# Patient Record
Sex: Female | Born: 1975 | Hispanic: Yes | Marital: Single | Smoking: Current some day smoker
Health system: Southern US, Community
[De-identification: ages and names within clinical notes are randomized; demographics above are authoritative.]

## PROBLEM LIST (undated history)

## (undated) ENCOUNTER — Inpatient Hospital Stay (HOSPITAL_COMMUNITY): Payer: Self-pay

## (undated) DIAGNOSIS — G8929 Other chronic pain: Secondary | ICD-10-CM

## (undated) DIAGNOSIS — N200 Calculus of kidney: Secondary | ICD-10-CM

## (undated) DIAGNOSIS — L309 Dermatitis, unspecified: Secondary | ICD-10-CM

## (undated) DIAGNOSIS — Z87442 Personal history of urinary calculi: Secondary | ICD-10-CM

## (undated) DIAGNOSIS — B379 Candidiasis, unspecified: Secondary | ICD-10-CM

## (undated) DIAGNOSIS — M1990 Unspecified osteoarthritis, unspecified site: Secondary | ICD-10-CM

## (undated) DIAGNOSIS — M5126 Other intervertebral disc displacement, lumbar region: Secondary | ICD-10-CM

## (undated) DIAGNOSIS — M778 Other enthesopathies, not elsewhere classified: Secondary | ICD-10-CM

## (undated) DIAGNOSIS — Z9889 Other specified postprocedural states: Secondary | ICD-10-CM

## (undated) HISTORY — DX: Other intervertebral disc displacement, lumbar region: M51.26

## (undated) HISTORY — DX: Unspecified osteoarthritis, unspecified site: M19.90

## (undated) HISTORY — PX: TUBAL LIGATION: SHX77

---

## 2003-01-03 HISTORY — PX: ECTOPIC PREGNANCY SURGERY: SHX613

## 2007-01-29 ENCOUNTER — Emergency Department (HOSPITAL_COMMUNITY): Admission: EM | Admit: 2007-01-29 | Discharge: 2007-01-29 | Payer: Self-pay | Admitting: Emergency Medicine

## 2007-07-06 ENCOUNTER — Emergency Department (HOSPITAL_COMMUNITY): Admission: EM | Admit: 2007-07-06 | Discharge: 2007-07-06 | Payer: Self-pay | Admitting: Emergency Medicine

## 2007-07-08 ENCOUNTER — Emergency Department (HOSPITAL_COMMUNITY): Admission: EM | Admit: 2007-07-08 | Discharge: 2007-07-08 | Payer: Self-pay | Admitting: Emergency Medicine

## 2008-07-25 ENCOUNTER — Emergency Department (HOSPITAL_COMMUNITY): Admission: EM | Admit: 2008-07-25 | Discharge: 2008-07-25 | Payer: Self-pay | Admitting: Emergency Medicine

## 2009-02-02 ENCOUNTER — Emergency Department (HOSPITAL_COMMUNITY): Admission: EM | Admit: 2009-02-02 | Discharge: 2009-02-02 | Payer: Self-pay | Admitting: Emergency Medicine

## 2009-10-30 ENCOUNTER — Emergency Department (HOSPITAL_COMMUNITY): Admission: EM | Admit: 2009-10-30 | Discharge: 2009-10-30 | Payer: Self-pay | Admitting: Family Medicine

## 2010-03-23 LAB — BASIC METABOLIC PANEL
BUN: 11 mg/dL (ref 6–23)
Calcium: 9.1 mg/dL (ref 8.4–10.5)
Creatinine, Ser: 0.71 mg/dL (ref 0.4–1.2)
GFR calc non Af Amer: >60 mL/min (ref >60)
Glucose, Bld: 111 mg/dL — ABNORMAL HIGH (ref 70–99)

## 2010-03-23 LAB — DIFFERENTIAL
Basophils Absolute: 0.1 10*3/uL (ref 0.0–0.1)
Eosinophils Relative: 4 % (ref 0–5)
Lymphocytes Relative: 29 % (ref 12–46)
Neutro Abs: 6.7 10*3/uL (ref 1.7–7.7)

## 2010-03-23 LAB — URINE CULTURE

## 2010-03-23 LAB — CBC
Platelets: 388 10*3/uL (ref 150–400)
RDW: 14 % (ref 11.5–15.5)

## 2010-03-23 LAB — URINALYSIS, ROUTINE W REFLEX MICROSCOPIC
Bilirubin Urine: NEGATIVE
Nitrite: POSITIVE — AB
Specific Gravity, Urine: 1.011 (ref 1.005–1.030)
pH: 7 (ref 5.0–8.0)

## 2010-03-23 LAB — URINE MICROSCOPIC-ADD ON

## 2010-03-23 LAB — WET PREP, GENITAL: Trich, Wet Prep: NONE SEEN

## 2010-03-23 LAB — POCT PREGNANCY, URINE: Preg Test, Ur: NEGATIVE

## 2010-04-18 ENCOUNTER — Inpatient Hospital Stay (HOSPITAL_COMMUNITY)
Admission: AD | Admit: 2010-04-18 | Discharge: 2010-04-18 | Disposition: A | Discharge status: Home / Self Care | Payer: Medicaid Other | Source: Ambulatory Visit | Attending: Obstetrics and Gynecology | Admitting: Obstetrics and Gynecology

## 2010-04-18 DIAGNOSIS — O479 False labor, unspecified: Secondary | ICD-10-CM | POA: Insufficient documentation

## 2010-04-20 ENCOUNTER — Encounter (HOSPITAL_COMMUNITY)
Admission: RE | Admit: 2010-04-20 | Discharge: 2010-04-20 | Disposition: A | Discharge status: Home / Self Care | Payer: Medicaid Other | Source: Ambulatory Visit | Attending: Obstetrics and Gynecology | Admitting: Obstetrics and Gynecology

## 2010-04-20 LAB — CBC
HCT: 32.1 % — ABNORMAL LOW (ref 36.0–46.0)
Hemoglobin: 10.6 g/dL — ABNORMAL LOW (ref 12.0–15.0)
RDW: 14.3 % (ref 11.5–15.5)
WBC: 10.4 10*3/uL (ref 4.0–10.5)

## 2010-04-20 LAB — SURGICAL PCR SCREEN
MRSA, PCR: NEGATIVE
Staphylococcus aureus: NEGATIVE

## 2010-04-20 LAB — RPR: RPR Ser Ql: NONREACTIVE

## 2010-04-25 ENCOUNTER — Inpatient Hospital Stay (HOSPITAL_COMMUNITY)
Admission: RE | Admit: 2010-04-25 | Discharge: 2010-04-28 | DRG: 766 | Disposition: A | Discharge status: Home / Self Care | Payer: Medicaid Other | Source: Ambulatory Visit | Attending: Obstetrics and Gynecology | Admitting: Obstetrics and Gynecology

## 2010-04-25 DIAGNOSIS — IMO0002 Reserved for concepts with insufficient information to code with codable children: Secondary | ICD-10-CM | POA: Diagnosis not present

## 2010-04-25 DIAGNOSIS — O34219 Maternal care for unspecified type scar from previous cesarean delivery: Principal | ICD-10-CM | POA: Diagnosis present

## 2010-04-25 LAB — TYPE AND SCREEN

## 2010-04-25 NOTE — H&P (Signed)
NAMETITYANA, [name] NO.:  [id number]  MEDICAL RECORD NO.:  [id number]           PATIENT TYPE:  LOCATION:                                 FACILITY:  PHYSICIAN:  Huel Cote, M.D. DATE OF BIRTH:  1975-07-27  DATE OF ADMISSION:  04/25/2010 DATE OF DISCHARGE:                             HISTORY & PHYSICAL   Surgery is to take place at the [facility] facility on April 25, 2010, at 7:30 a.m.  The patient is a 35 year old G5, P 2-0-2-2 who is coming in at 15 weeks' gestation for a scheduled repeat cesarean section.  The patient has had two prior cesarean sections and declined a trial of labor.  Her estimated due date is May 02, 2010, by an 11-week ultrasound.  Her prenatal care has been uneventful except for the prior two C-sections as stated.  Prenatal labs are as follows:  B+, antibody negative, RPR nonreactive.  Rubella immune, hepatitis B surface antigen negative, HIV negative.  Baseline platelets were 411.  Hemoglobin, electrophoresis AA, gonorrhea negative, Chlamydia negative, quad screen was negative.  She declined cystic fibrosis screening.  Her 1-hour Glucola was normal at 1:27.  Her group B strep was negative.  She had a normal anatomic ultrasound performed on December 15, 2009.  PAST MEDICAL HISTORY:  Significant for migraines only.  PAST SURGICAL HISTORY:  Significant for two prior cesarean sections in 1999 and 2001.  She also had a history of an ectopic pregnancy in 2005 with a salpingectomy performed through an incision and one elective abortion in 2005 as well.  Her prior two cesarean sections were in [place].  She has an allergy to PENICILLIN.  MEDICATIONS:  Prenatal vitamins only.  She has no history gynecologically of STDs or abnormal Pap smears.  SOCIAL HISTORY:  The patient is single with her boyfriend involved.  She is a smoker but has cut down to almost no cigarettes during the pregnancy.  She denies any current  alcohol or drug use.  PHYSICAL EXAMINATION:  VITAL SIGNS:  The patient's blood pressure is 138/80, weight is 172 pounds. CARDIAC:  Regular rate and rhythm. LUNGS:  Clear. ABDOMEN:  Gravid, nontender.  Cervix is fingertip, 20 and a -3 station. She has an old Pfannenstiel incision with a small area of scar tissue noted under the left aspect which raised and feels as it is in the skin layer.  No discrete hernia is palpable.  The patient has been counseled as to her options but desires to proceed with a repeat cesarean section.  The risks and benefits of surgery were discussed with the patient in detail including bleeding, infection, and possible damage to bowel and bladder.  We also discussed this area of raised tissue under the left aspect of her scar and hopefully, this will be a scar tissue that can be revised.  There is no hernia palpated but it could represent a small hernia as well and we will assess this at the time of surgery.  It really caused her no pain or difficulty and feels immobile as if it is a portion of the skin. Again, all  options were addressed with the patient and she desires to proceed with cesarean section as stated.  She did not wish any permanent sterilization such as a tubal ligation at this point.     Huel Cote, M.D.     KR/MEDQ  D:  04/24/2010  T:  04/24/2010  Job:  [id number]  Electronically Signed by Huel Cote M.D. on 04/25/2010 09:16:09 AM

## 2010-04-26 LAB — CBC
Hemoglobin: 8.8 g/dL — ABNORMAL LOW (ref 12.0–15.0)
MCH: 26.7 pg (ref 26.0–34.0)
MCHC: 33.2 g/dL (ref 30.0–36.0)
MCV: 80.5 fL (ref 78.0–100.0)

## 2010-04-28 LAB — CBC
MCH: 26.6 pg (ref 26.0–34.0)
MCHC: 33 g/dL (ref 30.0–36.0)
MCV: 80.7 fL (ref 78.0–100.0)
Platelets: 218 10*3/uL (ref 150–400)
RDW: 14.4 % (ref 11.5–15.5)
WBC: 10.3 10*3/uL (ref 4.0–10.5)

## 2010-04-28 LAB — COMPREHENSIVE METABOLIC PANEL
Albumin: 2.5 g/dL — ABNORMAL LOW (ref 3.5–5.2)
BUN: 7 mg/dL (ref 6–23)
Calcium: 9.6 mg/dL (ref 8.4–10.5)
Creatinine, Ser: 0.81 mg/dL (ref 0.4–1.2)
Glucose, Bld: 79 mg/dL (ref 70–99)
Potassium: 3.5 mEq/L (ref 3.5–5.1)
Total Protein: 5.5 g/dL — ABNORMAL LOW (ref 6.0–8.3)

## 2010-04-28 LAB — URIC ACID: Uric Acid, Serum: 5.2 mg/dL (ref 2.4–7.0)

## 2010-04-28 LAB — LACTATE DEHYDROGENASE: LDH: 142 U/L (ref 94–250)

## 2010-04-29 ENCOUNTER — Inpatient Hospital Stay (HOSPITAL_COMMUNITY)
Admission: AD | Admit: 2010-04-29 | Discharge: 2010-05-01 | DRG: 776 | Disposition: A | Discharge status: Home / Self Care | Payer: Medicaid Other | Source: Ambulatory Visit | Attending: Obstetrics and Gynecology | Admitting: Obstetrics and Gynecology

## 2010-04-29 DIAGNOSIS — IMO0002 Reserved for concepts with insufficient information to code with codable children: Principal | ICD-10-CM | POA: Diagnosis present

## 2010-04-29 LAB — COMPREHENSIVE METABOLIC PANEL
ALT: 115 U/L — ABNORMAL HIGH (ref 0–35)
AST: 73 U/L — ABNORMAL HIGH (ref 0–37)
Alkaline Phosphatase: 108 U/L (ref 39–117)
CO2: 28 mEq/L (ref 19–32)
Calcium: 9.7 mg/dL (ref 8.4–10.5)
GFR calc Af Amer: >60 mL/min (ref >60)
GFR calc non Af Amer: >60 mL/min (ref >60)
Glucose, Bld: 78 mg/dL (ref 70–99)
Potassium: 3.8 mEq/L (ref 3.5–5.1)
Sodium: 137 mEq/L (ref 135–145)

## 2010-04-29 LAB — CBC
HCT: 28.6 % — ABNORMAL LOW (ref 36.0–46.0)
Hemoglobin: 9.4 g/dL — ABNORMAL LOW (ref 12.0–15.0)
RBC: 3.54 MIL/uL — ABNORMAL LOW (ref 3.87–5.11)
WBC: 10.3 10*3/uL (ref 4.0–10.5)

## 2010-04-29 LAB — URINALYSIS, ROUTINE W REFLEX MICROSCOPIC
Bilirubin Urine: NEGATIVE
Glucose, UA: NEGATIVE mg/dL
Ketones, ur: NEGATIVE mg/dL
Protein, ur: NEGATIVE mg/dL
pH: 6.5 (ref 5.0–8.0)

## 2010-04-29 LAB — URINE MICROSCOPIC-ADD ON

## 2010-04-30 LAB — COMPREHENSIVE METABOLIC PANEL
ALT: 109 U/L — ABNORMAL HIGH (ref 0–35)
AST: 61 U/L — ABNORMAL HIGH (ref 0–37)
Albumin: 2.7 g/dL — ABNORMAL LOW (ref 3.5–5.2)
Calcium: 9.1 mg/dL (ref 8.4–10.5)
Creatinine, Ser: 0.87 mg/dL (ref 0.4–1.2)
GFR calc Af Amer: >60 mL/min (ref >60)
GFR calc non Af Amer: >60 mL/min (ref >60)
Sodium: 138 mEq/L (ref 135–145)
Total Protein: 6 g/dL (ref 6.0–8.3)

## 2010-04-30 LAB — CBC
Hemoglobin: 9.4 g/dL — ABNORMAL LOW (ref 12.0–15.0)
MCH: 26.8 pg (ref 26.0–34.0)
MCHC: 33.5 g/dL (ref 30.0–36.0)
Platelets: 342 10*3/uL (ref 150–400)
RDW: 14.3 % (ref 11.5–15.5)

## 2010-05-01 LAB — COMPREHENSIVE METABOLIC PANEL
AST: 36 U/L (ref 0–37)
CO2: 30 mEq/L (ref 19–32)
Chloride: 98 mEq/L (ref 96–112)
Creatinine, Ser: 0.88 mg/dL (ref 0.4–1.2)
GFR calc Af Amer: >60 mL/min (ref >60)
GFR calc non Af Amer: >60 mL/min (ref >60)
Glucose, Bld: 101 mg/dL — ABNORMAL HIGH (ref 70–99)
Total Bilirubin: 0.5 mg/dL (ref 0.3–1.2)

## 2010-05-01 LAB — CBC
HCT: 30.5 % — ABNORMAL LOW (ref 36.0–46.0)
Hemoglobin: 10.1 g/dL — ABNORMAL LOW (ref 12.0–15.0)
MCH: 26.6 pg (ref 26.0–34.0)
MCHC: 33.1 g/dL (ref 30.0–36.0)
MCV: 80.5 fL (ref 78.0–100.0)
RBC: 3.79 MIL/uL — ABNORMAL LOW (ref 3.87–5.11)

## 2010-05-09 NOTE — Op Note (Addendum)
[name], [name]              ACCOUNT NO.:  [id number]  MEDICAL RECORD NO.:  [id number]           PATIENT TYPE:  O  LOCATION:  [facility]                         [facility]  PHYSICIAN:  Huel Cote, M.D. DATE OF BIRTH:  Aug 02, 1975  DATE OF PROCEDURE:  04/25/2010 DATE OF DISCHARGE:  04/18/2010                              OPERATIVE REPORT   PREOPERATIVE DIAGNOSES: 1. Term pregnancy at 39 weeks. 2. Prior C-section x2, declined vaginal birth after cesarean.  POSTOPERATIVE DIAGNOSES: 1. Term pregnancy at 39 weeks. 2. Prior C-section x2, declined vaginal birth after cesarean.  PROCEDURE:  Repeat low transverse C-section with the uterine incision slightly higher on the fundus and normal due to significant scar tissue with the bladder.  SURGEON:  Huel Cote, MD  ANESTHESIA:  Spinal.  FINDINGS:  There was a viable female infant in the vertex presentation. Apgar's were 7 and 9, weight was pending at the time of dictation. There was normal right ovary and tube noted.  The left ovary was normal with the tube interrupted from her prior ectopic pregnancy.  There was significant scarring of the uterine fundus to the anterior abdominal wall.  This was mostly on the patient's left hand, had to be taken down in order to reach the lower uterine segment.  SPECIMEN:  Placenta was sent to L&D.  ESTIMATED BLOOD LOSS:  700 mL, 250 mL clear urine, 2200 mL of LR.  There were no known complications other than the scarring which made the procedure slightly more difficult.  PROCEDURE NOTE:  The patient was taken to the operating room where spinal anesthesia was obtained without difficulty.  She was then prepped and draped in the normal sterile fashion in the dorsal supine position with a leftward tilt.  A Pfannenstiel's skin incision was then made through preexisting scar and upon encountering the dermal tissue on the patient's left, there was a mucinous cyst which was encountered  and drained and resected.  The cyst wall itself was cauterized.  Once this was accomplished, the fascia was opened in the midline with Bovie cautery and the incision was extended laterally with Mayo scissors bilaterally.  The inferior aspect was grasped with a Kocher clamp, elevated and dissected off the underlying rectus muscles.  The superior aspect likewise was dissected off the rectus muscles.  The rectus muscles were then attempted to be separated in the midline but there was dense scar tissue present.  Therefore, the tissue was elevated and the incision was sharply made along the midline.  The peritoneal cavity was identified superiorly and entered and a sweep of the finger revealed that the fundus was densely adherent to the rectus muscles anteriorly and slightly to the left.  Attention was then turned inferiorly where with careful dissection the peritoneal cavity was identified just above the bladder flap.  Once this was done, the bladder was carefully dissected away and at this point it was possible to isolate the dense band of scar tissue along the uterine fundus and take it down with Bovie cautery, thus releasing the fundus.  Once this was accomplished, the peritoneal cavity incision was large enough to effect  delivery and the Alexis retractor was placed within the incision.  The lower uterine segment was not well developed and the bladder flap did extend somewhat high on the uterus.  It was taken down and pushed away as much as it could be and right above that is where the uterine incision was made transversely.  The tissue here was still quite thick and was consistent with being slightly higher on the fundus than in the lower uterine segment but was as low as could be accomplished due to the bladder scarring.  This was then entered bluntly and clear fluid noted.  The bandage scissors were utilized as well as blunt dissection to extend the uterine incision and once this was  accomplished, the infant's head was elevated and delivered atraumatically.  The remainder of the infant was delivered.  The nose and mouth bulb suctioned and the infant's cord clamped and cut and handed to the awaiting pediatricians.  The cord blood was obtained and the placenta was then delivered manually.  The uterine cavity was cleared of all clots and debris with moist lap sponge.  Due to the tight nature of the incision, it was not easily possible to exteriorize the uterus, however, visualization was adequate and the uterine incision was closed in 2 layers, the first a running locked layer of 1-0 chromic and the second an imbricating layer of the same suture and again the tissue here was quite thick which was consistent with being more in the muscle layer of the lower fundus than the lower uterine segment.  For this reason, after the C-section the patient was counseled that she should not attempt any trial of labor in the future.  The uterine incision appeared hemostatic.  The tubes and ovaries were inspected as stated and found to be normal except for the prior salpingectomy on the patient's left from her ectopic pregnancy. The bladder flap was inspected and appeared hemostatic.  The urine was clear and there was no evidence of any bladder injury.  At this point, all instruments and sponges were removed from the abdomen.  The rectus muscles were reapproximated with several interrupted mattress sutures of 3-0 Vicryl.  The fascia was then closed with 0 Vicryl in a running fashion.  The subcutaneous tissue was reapproximated with 3-0 plain and the skin was closed with staples.  Sponge, lap, and needle counts were correct x2 and the patient was taken to the recovery room in good condition.     Huel Cote, M.D.     KR/MEDQ  D:  04/25/2010  T:  04/25/2010  Job:  [id number]  Electronically Signed by Huel Cote M.D. on 05/25/2010 08:25:00 PM

## 2010-05-09 NOTE — Discharge Summary (Signed)
  [name], [name]              ACCOUNT NO.:  [id number]  MEDICAL RECORD NO.:  [id number]           PATIENT TYPE:  I  LOCATION:  9105                          [facility]  PHYSICIAN:  Huel Cote, M.D. DATE OF BIRTH:  03/02/1975  DATE OF ADMISSION:  04/25/2010 DATE OF DISCHARGE:  04/28/2010                              DISCHARGE SUMMARY   DISCHARGE DIAGNOSES: 1. Term pregnancy at 34 weeks' gestation. 2. Prior cesarean section x2, declined trial of labor. 3. Status post repeat low transverse cesarean section and lysis of     adhesions.  DISCHARGE MEDICATIONS: 1. Motrin 600 mg p.o. every 6 hours. 2. Percocet 1-2 tablets p.o. every 4 hours p.r.n.  DISCHARGE FOLLOWUP:  The patient is to follow up in the office in approximately 3 days for staple removal and a blood pressure check.  HOSPITAL COURSE:  The patient is a 35 year old G5, P 2-0-2-2, who came in at 71 weeks' gestation for a scheduled repeat cesarean section.  For her full history and physical, please see that previously dictated version.  On April 25, 2010, she underwent a repeat low transverse cesarean section and was delivered of a viable female infant, Apgars were 7 and 9, weight was 6 pounds 13 ounces.  She had a normal right ovary and tube.  Her left ovary was normal.  Her left tube had had a prior salpingectomy for an ectopic.  She did have some dense scarring of her anterior fundus to the abdominal wall which was taken down with the cesarean section.  She underwent cesarean section as stated without complication, was admitted for routine postoperative care.  She did well.  Postop hemoglobin on postop day #1 was 8.8.  She continued to feel well.  On the early morning postop day #3, she was noted to have some increased lower extremity edema and a blood pressure of 150/90.  At that time, she had repeat labs done with pre-eclamptic labs performed that were normal.  She had a mild headache that resolved with  Percocet and then her blood pressures actually improved to 140s over 80s, so on postop day #3 she was felt stable for discharge home.  She was discharged to follow up in the office for her staple removal on Monday, May 02, 2010, and given instructions on pelvic rest and will have a blood pressure check in the office that day as well.     Huel Cote, M.D.     KR/MEDQ  D:  04/28/2010  T:  04/28/2010  Job:  [id number]  Electronically Signed by Huel Cote M.D. on 05/09/2010 09:15:09 AM

## 2010-05-10 NOTE — Discharge Summary (Signed)
[name], [name]              ACCOUNT NO.:  [id number]  MEDICAL RECORD NO.:  [id number]           PATIENT TYPE:  I  LOCATION:  9372                          [facility]  PHYSICIAN:  Sherron Monday, MD        DATE OF BIRTH:  1975/05/02  DATE OF ADMISSION:  04/29/2010 DATE OF DISCHARGE:  05/01/2010                              DISCHARGE SUMMARY   ADMITTING DIAGNOSIS:  Postpartum preeclampsia.  DISCHARGE DIAGNOSIS:  Postpartum preeclampsia, status post 24 hours magnesium sulfate as well as hypertensive control.  HISTORY OF PRESENT ILLNESS:  A 35 year old G5, P 3-0-2-3 status post delivery of a viable female infant weighing 6 pounds 13 ounces, delivered by repeat low transverse cesarean section on the 23rd, admitted on postoperative day #4 with increased blood pressures and increased liver enzymes for postpartum magnesium for 24 hours likely help and the plan was to increase her dose of labetalol and monitor blood pressure and liver enzymes.  On admission, her SGOT was 73, her SGPT was 115, and platelets were 321.  Her urinalysis was actually negative for protein.  PAST MEDICAL HISTORY:  Significant for migraines.  SURGICAL HISTORY:  Significant for low transverse cesarean section x3, ex-lap, salpingectomy for an ectopic pregnancy.  PAST OB/GYN HISTORY:  She has had 3 low transverse cesarean sections, therapeutic abortion, and an ectopic.  No history of any abnormal Pap smears or sexually transmitted diseases.  MEDICATIONS:  Labetalol 100 twice daily, Motrin, and Percocet.  ALLERGIES:  PENICILLIN.  SOCIAL HISTORY:  She is single repeat.  She admits to smoking, no alcohol, no drug.  FAMILY HISTORY:  Noncontributory.  PHYSICAL EXAMINATION:  VITAL SIGNS:  On admission, her blood pressure was 153/110 and had been as high as 18 to 190 over 105 on her home monitor.  She was admitted with a benign exam. GENERAL:  No apparent distress. CARDIOVASCULAR:  Regular rate and  rhythm. LUNGS:  Clear to auscultation bilaterally. ABDOMEN:  Soft.  Fundus firm and nontender.  Incision is clean, dry, and intact.  Staples were present. EXTREMITIES:  Symmetric and nontender.  She was admitted for to receive her Percocet and Motrin for pain, increase dose of labetalol to 200, if necessary treat with IV labetalol. She received the magnesium for seizure prophylaxis.  Her blood pressures were 130 to 150s over 70s to 90s with good urine output.  About approximately 12 hours after starting the magnesium, her liver enzymes were falling.  After the magnesium was turned off, her platelets were stable.  SGOT had fallen to 36, SGPT had followed to be 89.  Her Pitocin was continued to be repleted as it had been noted to be as low as 2.6. She was discharged home with routine discharge instructions and was to call with any questions or problems.  Increased dose of labetalol and K- Dur for prescription.  She will follow up in the office at the end of the week.  The staples removed and Steri-Strips were applied.     Sherron Monday, MD  JB/MEDQ  D:  05/01/2010  T:  05/02/2010  Job:  [id number]  Electronically Signed by  Sherron Monday MD on 05/10/2010 08:50:51 AM

## 2010-09-22 LAB — WET PREP, GENITAL
Clue Cells Wet Prep HPF POC: NONE SEEN
WBC, Wet Prep HPF POC: NONE SEEN
Yeast Wet Prep HPF POC: NONE SEEN

## 2010-09-22 LAB — URINALYSIS, ROUTINE W REFLEX MICROSCOPIC
Bilirubin Urine: NEGATIVE
Nitrite: POSITIVE — AB
Protein, ur: 100 — AB
Urobilinogen, UA: 0.2

## 2010-09-22 LAB — URINE MICROSCOPIC-ADD ON

## 2010-09-22 LAB — PREGNANCY, URINE: Preg Test, Ur: NEGATIVE

## 2011-03-10 ENCOUNTER — Encounter (HOSPITAL_COMMUNITY): Payer: Self-pay | Admitting: *Deleted

## 2011-03-10 ENCOUNTER — Emergency Department (HOSPITAL_COMMUNITY)
Admission: EM | Admit: 2011-03-10 | Discharge: 2011-03-10 | Discharge status: Against Medical Advice | Payer: Medicaid Other | Attending: Emergency Medicine | Admitting: Emergency Medicine

## 2011-03-10 DIAGNOSIS — M549 Dorsalgia, unspecified: Secondary | ICD-10-CM | POA: Insufficient documentation

## 2011-03-10 NOTE — ED Notes (Signed)
The pt has had some thoracic spine pain all day.  She has had some back pain in the past

## 2011-04-22 ENCOUNTER — Emergency Department (HOSPITAL_COMMUNITY): Payer: Medicaid Other

## 2011-04-22 ENCOUNTER — Emergency Department (HOSPITAL_COMMUNITY)
Admission: EM | Admit: 2011-04-22 | Discharge: 2011-04-22 | Disposition: A | Discharge status: Home / Self Care | Payer: Medicaid Other | Attending: Emergency Medicine | Admitting: Emergency Medicine

## 2011-04-22 ENCOUNTER — Encounter (HOSPITAL_COMMUNITY): Payer: Self-pay | Admitting: Emergency Medicine

## 2011-04-22 DIAGNOSIS — H9209 Otalgia, unspecified ear: Secondary | ICD-10-CM | POA: Insufficient documentation

## 2011-04-22 DIAGNOSIS — F17200 Nicotine dependence, unspecified, uncomplicated: Secondary | ICD-10-CM | POA: Insufficient documentation

## 2011-04-22 DIAGNOSIS — K0889 Other specified disorders of teeth and supporting structures: Secondary | ICD-10-CM

## 2011-04-22 DIAGNOSIS — I10 Essential (primary) hypertension: Secondary | ICD-10-CM | POA: Insufficient documentation

## 2011-04-22 DIAGNOSIS — K089 Disorder of teeth and supporting structures, unspecified: Secondary | ICD-10-CM | POA: Insufficient documentation

## 2011-04-22 MED ORDER — IBUPROFEN 800 MG PO TABS: 800.0000 mg | ORAL_TABLET | Freq: Three times a day (TID) | ORAL | Status: AC

## 2011-04-22 MED ORDER — CLINDAMYCIN HCL 150 MG PO CAPS: 150.0000 mg | ORAL_CAPSULE | Freq: Four times a day (QID) | ORAL | Status: AC

## 2011-04-22 MED ORDER — TRAMADOL HCL 50 MG PO TABS: 50.0000 mg | ORAL_TABLET | Freq: Four times a day (QID) | ORAL | Status: AC | PRN

## 2011-04-22 MED ORDER — KETOROLAC TROMETHAMINE 60 MG/2ML IM SOLN
60.0000 mg | Freq: Once | INTRAMUSCULAR | Status: AC
  Administered 2011-04-22: 60 mg via INTRAMUSCULAR
  Filled 2011-04-22: qty 2

## 2011-04-22 MED ORDER — HYDROMORPHONE HCL PF 2 MG/ML IJ SOLN
0.5000 mg | Freq: Once | INTRAMUSCULAR | Status: AC
  Administered 2011-04-22: 0.5 mg via INTRAMUSCULAR
  Filled 2011-04-22: qty 1

## 2011-04-22 NOTE — Discharge Instructions (Signed)
You Xray showed multiple cavities. Please take your medication and follow-up with the Dentist in Monday. Take medication as recommended for pain  Dental Pain A tooth ache may be caused by cavities (tooth decay). Cavities expose the nerve of the tooth to air and hot or cold temperatures. It may come from an infection or abscess (also called a boil or furuncle) around your tooth. It is also often caused by dental caries (tooth decay). This causes the pain you are having. DIAGNOSIS  Your caregiver can diagnose this problem by exam. TREATMENT   If caused by an infection, it may be treated with medications which kill germs (antibiotics) and pain medications as prescribed by your caregiver. Take medications as directed.   Only take over-the-counter or prescription medicines for pain, discomfort, or fever as directed by your caregiver.   Whether the tooth ache today is caused by infection or dental disease, you should see your dentist as soon as possible for further care.  SEEK MEDICAL CARE IF: The exam and treatment you received today has been provided on an emergency basis only. This is not a substitute for complete medical or dental care. If your problem worsens or new problems (symptoms) appear, and you are unable to meet with your dentist, call or return to this location. SEEK IMMEDIATE MEDICAL CARE IF:   You have a fever.   You develop redness and swelling of your face, jaw, or neck.   You are unable to open your mouth.   You have severe pain uncontrolled by pain medicine.  MAKE SURE YOU:   Understand these instructions.   Will watch your condition.   Will get help right away if you are not doing well or get worse.  Document Released: 12/19/2004 Document Revised: 12/08/2010 Document Reviewed: 08/07/2007 [facility] Patient Information 2012 [place], [place].

## 2011-04-22 NOTE — ED Provider Notes (Signed)
Medical screening examination/treatment/procedure(s) were performed by non-physician practitioner and as supervising physician I was immediately available for consultation/collaboration.   Forbes Cellar, MD 04/22/11 2107

## 2011-04-22 NOTE — ED Notes (Signed)
Pt c/o left sided toothache x 1 1/2 weeks. Pt reports had appointment with dentist but missed appointment due to school. Pain extends into left ear.

## 2011-04-22 NOTE — ED Provider Notes (Signed)
History     CSN: [id number]  Arrival date & time 04/22/11  7253   First MD Initiated Contact with Patient 04/22/11 1021      10:57 AM HPI Patient reports for 3 days has had pain in her left cheek. States she thinks it is her teeth. Reports gums are extremely sensitive. Denies fever, drainage, swelling. Reports being unable to eat but able to swallow without difficulty. Patient is a 36 y.o. female presenting with tooth pain. The history is provided by the patient.  Dental PainThe primary symptoms include mouth pain. Primary symptoms do not include oral bleeding, oral lesions, headaches, fever, sore throat or cough. The symptoms began 3 to 5 days ago. The symptoms are worsening. The symptoms are new. The symptoms occur constantly.  Additional symptoms include: dental sensitivity to temperature, gum tenderness and ear pain. Additional symptoms do not include: gum swelling, purulent gums, trismus, jaw pain, facial swelling and trouble swallowing.    Past Medical History  Diagnosis Date  . Hypertension   . Ectopic pregnancy     Past Surgical History  Procedure Date  . Cesarean section     History reviewed. No pertinent family history.  History  Substance Use Topics  . Smoking status: Current Everyday Smoker  . Smokeless tobacco: Not on file  . Alcohol Use: Yes     socially    OB History    Grav Para Term Preterm Abortions TAB SAB Ect Mult Living                  Review of Systems  Constitutional: Negative for fever and chills.  HENT: Positive for ear pain. Negative for sore throat, facial swelling, trouble swallowing and neck pain.        Positive for toothache  Respiratory: Negative for cough.   Neurological: Negative for headaches.  All other systems reviewed and are negative.    Allergies  Penicillins  Home Medications   Current Outpatient Rx  Name Route Sig Dispense Refill  . ACETAMINOPHEN 500 MG PO TABS Oral Take 1,000 mg by mouth every 6 (six) hours as  needed. For pain.    Marland Kitchen LABETALOL HCL 200 MG PO TABS Oral Take 200 mg by mouth 2 (two) times daily.      BP 134/81  Pulse 76  Temp(Src) 98.6 F (37 C) (Oral)  Resp 16  SpO2 97%  LMP 04/06/2011  Physical Exam  Vitals reviewed. Constitutional: Vital signs are normal. She appears well-developed and well-nourished. No distress.  HENT:  Head: Normocephalic and atraumatic.  Right Ear: External ear normal.  Left Ear: External ear normal.  Nose: Nose normal.  Mouth/Throat: Uvula is midline, oropharynx is clear and moist and mucous membranes are normal. Normal dentition. No dental caries. Dental abscesses: Left buccal mucusal edema. Does not particularly have a tender took with persuccion.  Eyes: Pupils are equal, round, and reactive to light.  Neck: Neck supple.  Pulmonary/Chest: Effort normal.  Neurological: She is alert.  Skin: Skin is warm and dry. No rash noted. No erythema. No pallor.  Psychiatric: She has a normal mood and affect. Her behavior is normal.    ED Course  Procedures  Dg Orthopantogram  04/22/2011  *RADIOLOGY REPORT*  Clinical Data: 36 year old female with left-sided tooth pain.  ORTHOPANTOGRAM/PANORAMIC  Comparison: None.  Findings: The left maxillary wisdom tooth and posterior molar are surgically absent.  The right mandible anterior molar is surgically absent.  There are dental caries of the left mandibular molar.  No peri apical lucency.  Mandible bone mineralization appears within normal limits.  IMPRESSION: Left mandibular molar dental caries.  Recommend dental follow up.  Original Report Authenticated By: Harley Hallmark, M.D.     MDM  11:50 AM Patient had some relief from IM Toradol and 0.5 of Dilaudid. Advised patient to followup with dentist on Monday. We'll prescribe ibuprofen, Ultram, clindamycin.  Diagnosis dental caries       Thomasene Lot, PA-C 04/22/11 1151

## 2011-04-22 NOTE — ED Notes (Signed)
Patient transported to X-ray 

## 2012-01-23 ENCOUNTER — Encounter (HOSPITAL_COMMUNITY): Payer: Self-pay

## 2012-01-23 ENCOUNTER — Emergency Department (INDEPENDENT_AMBULATORY_CARE_PROVIDER_SITE_OTHER)
Admission: EM | Admit: 2012-01-23 | Discharge: 2012-01-23 | Disposition: A | Discharge status: Home / Self Care | Payer: Medicaid Other | Source: Home / Self Care | Attending: Emergency Medicine | Admitting: Emergency Medicine

## 2012-01-23 ENCOUNTER — Emergency Department (INDEPENDENT_AMBULATORY_CARE_PROVIDER_SITE_OTHER): Payer: Medicaid Other

## 2012-01-23 DIAGNOSIS — S6980XA Other specified injuries of unspecified wrist, hand and finger(s), initial encounter: Secondary | ICD-10-CM

## 2012-01-23 DIAGNOSIS — S6990XA Unspecified injury of unspecified wrist, hand and finger(s), initial encounter: Secondary | ICD-10-CM

## 2012-01-23 MED ORDER — MELOXICAM 7.5 MG PO TABS: 7.5000 mg | ORAL_TABLET | Freq: Every day | ORAL | Status: DC

## 2012-01-23 NOTE — ED Provider Notes (Signed)
History     CSN: [id number]  Arrival date & time 01/23/12  1836   First MD Initiated Contact with Patient 01/23/12 1838      Chief Complaint  Patient presents with  . Hand Pain    (Consider location/radiation/quality/duration/timing/severity/associated sxs/prior treatment) HPI Comments: Patient presents this evening to urgent care describing that about 2 weeks ago while she was trying to discipline her son, " I was tried to slap him", " he has ADHD", he pushed my right hand backwards and hit the wall. Since then has been somewhat sore and hurting when I try to been my right thumb. I have been taking classes and have been unable to hold a pen to write as it has become increasingly worse"..  Patient is a 37 y.o. female presenting with hand pain.  Hand Pain This is a new problem. The current episode started more than 1 week ago. The problem occurs constantly. The problem has been gradually worsening. Pertinent negatives include no abdominal pain and no shortness of breath. Exacerbated by: movement and activity of right thumb. The symptoms are relieved by ice. Treatments tried: ice packs and Tylenol. The treatment provided no relief.    Past Medical History  Diagnosis Date  . Hypertension   . Ectopic pregnancy     Past Surgical History  Procedure Date  . Cesarean section     History reviewed. No pertinent family history.  History  Substance Use Topics  . Smoking status: Current Every Day Smoker  . Smokeless tobacco: Not on file  . Alcohol Use: Yes     Comment: socially    OB History    Grav Para Term Preterm Abortions TAB SAB Ect Mult Living                  Review of Systems  Constitutional: Positive for activity change. Negative for fever, chills, diaphoresis, appetite change, fatigue and unexpected weight change.  Respiratory: Negative for shortness of breath.   Gastrointestinal: Negative for abdominal pain.  Skin: Negative for color change, rash and wound.    Neurological: Negative for weakness and numbness.    Allergies  Penicillins  Home Medications   Current Outpatient Rx  Name  Route  Sig  Dispense  Refill  . LABETALOL HCL 200 MG PO TABS   Oral   Take 200 mg by mouth 2 (two) times daily.         . MELOXICAM 7.5 MG PO TABS   Oral   Take 1 tablet (7.5 mg total) by mouth daily. Take one tablet daily for 2 weeks   14 tablet   0     BP 124/79  Pulse 75  Temp 98.1 F (36.7 C) (Oral)  Resp 16  Ht 5' (1.524 m)  Wt 160 lb (72.576 kg)  BMI 31.25 kg/m2  SpO2 98%  LMP 12/14/2011  Physical Exam  Nursing note and vitals reviewed. Constitutional: She appears well-developed and well-nourished. No distress.  Musculoskeletal: She exhibits tenderness. She exhibits no edema.       Hands: Neurological: She is alert.  Skin: No rash noted. No erythema.    ED Course  Procedures (including critical care time)  Labs Reviewed - No data to display No results found.   1. Thumb injury       MDM  Patient was placed in a thumb spica for comfort to be useful next 5-7 days which she completed an anti-inflammatory course with meloxicam. Have been encouraged that if pain was  to persist or any restriction of range of motion of her right thumb after 10 days follow-up orthopedic Dr.patient agreed with treatment plan and follow-up care as necessary.        Jimmie Molly, MD 01/23/12 1929

## 2012-01-23 NOTE — ED Notes (Addendum)
Reportedly injured her right hand/wrist/thumb 2 weeks ago; no improvement w ice, OTC creams, heat, tylenol; c/o pain "8" on 0-10 scale , uncontrolled w tylenol

## 2012-03-21 LAB — OB RESULTS CONSOLE GC/CHLAMYDIA: Gonorrhea: NEGATIVE

## 2012-03-21 LAB — OB RESULTS CONSOLE RUBELLA ANTIBODY, IGM: Rubella: IMMUNE

## 2012-03-21 LAB — OB RESULTS CONSOLE HIV ANTIBODY (ROUTINE TESTING): HIV: NONREACTIVE

## 2012-04-01 ENCOUNTER — Encounter (HOSPITAL_COMMUNITY): Payer: Self-pay | Admitting: Internal Medicine

## 2012-04-01 ENCOUNTER — Inpatient Hospital Stay (HOSPITAL_COMMUNITY)
Admission: EM | Admit: 2012-04-01 | Discharge: 2012-04-02 | DRG: 781 | Disposition: A | Discharge status: Home / Self Care | Payer: Medicaid Other | Attending: Internal Medicine | Admitting: Internal Medicine

## 2012-04-01 ENCOUNTER — Emergency Department (HOSPITAL_COMMUNITY): Payer: Medicaid Other

## 2012-04-01 DIAGNOSIS — Z331 Pregnant state, incidental: Secondary | ICD-10-CM

## 2012-04-01 DIAGNOSIS — N200 Calculus of kidney: Secondary | ICD-10-CM | POA: Diagnosis present

## 2012-04-01 DIAGNOSIS — F17200 Nicotine dependence, unspecified, uncomplicated: Secondary | ICD-10-CM | POA: Diagnosis present

## 2012-04-01 DIAGNOSIS — N12 Tubulo-interstitial nephritis, not specified as acute or chronic: Secondary | ICD-10-CM | POA: Diagnosis present

## 2012-04-01 DIAGNOSIS — D72829 Elevated white blood cell count, unspecified: Secondary | ICD-10-CM | POA: Diagnosis present

## 2012-04-01 DIAGNOSIS — O2390 Unspecified genitourinary tract infection in pregnancy, unspecified trimester: Principal | ICD-10-CM | POA: Diagnosis present

## 2012-04-01 DIAGNOSIS — N201 Calculus of ureter: Secondary | ICD-10-CM | POA: Diagnosis present

## 2012-04-01 DIAGNOSIS — N390 Urinary tract infection, site not specified: Secondary | ICD-10-CM

## 2012-04-01 DIAGNOSIS — O26839 Pregnancy related renal disease, unspecified trimester: Secondary | ICD-10-CM | POA: Diagnosis present

## 2012-04-01 DIAGNOSIS — Z8249 Family history of ischemic heart disease and other diseases of the circulatory system: Secondary | ICD-10-CM

## 2012-04-01 DIAGNOSIS — O169 Unspecified maternal hypertension, unspecified trimester: Secondary | ICD-10-CM | POA: Diagnosis present

## 2012-04-01 DIAGNOSIS — N1330 Unspecified hydronephrosis: Secondary | ICD-10-CM | POA: Diagnosis present

## 2012-04-01 DIAGNOSIS — Z3490 Encounter for supervision of normal pregnancy, unspecified, unspecified trimester: Secondary | ICD-10-CM

## 2012-04-01 DIAGNOSIS — Z833 Family history of diabetes mellitus: Secondary | ICD-10-CM

## 2012-04-01 LAB — CBC WITH DIFFERENTIAL/PLATELET
Basophils Absolute: 0 10*3/uL (ref 0.0–0.1)
Basophils Relative: 0 % (ref 0–1)
Eosinophils Absolute: 0.3 10*3/uL (ref 0.0–0.7)
MCH: 27.2 pg (ref 26.0–34.0)
MCHC: 35.5 g/dL (ref 30.0–36.0)
Neutrophils Relative %: 70 % (ref 43–77)
Platelets: 393 10*3/uL (ref 150–400)
RDW: 14.5 % (ref 11.5–15.5)

## 2012-04-01 LAB — URINALYSIS, MICROSCOPIC ONLY
Glucose, UA: NEGATIVE mg/dL
Ketones, ur: 15 mg/dL — AB
pH: 7 (ref 5.0–8.0)

## 2012-04-01 LAB — COMPREHENSIVE METABOLIC PANEL
ALT: 14 U/L (ref 0–35)
Albumin: 3.8 g/dL (ref 3.5–5.2)
Alkaline Phosphatase: 58 U/L (ref 39–117)
BUN: 7 mg/dL (ref 6–23)
Potassium: 3.5 mEq/L (ref 3.5–5.1)
Sodium: 138 mEq/L (ref 135–145)
Total Protein: 7.2 g/dL (ref 6.0–8.3)

## 2012-04-01 LAB — PROTIME-INR: Prothrombin Time: 13.9 seconds (ref 11.6–15.2)

## 2012-04-01 LAB — APTT: aPTT: 29 seconds (ref 24–37)

## 2012-04-01 MED ORDER — ONDANSETRON HCL 4 MG/2ML IJ SOLN: 4.0000 mg | Freq: Four times a day (QID) | INTRAMUSCULAR | Status: DC | PRN

## 2012-04-01 MED ORDER — DEXTROSE 5 % IV SOLN
1.0000 g | INTRAVENOUS | Status: DC
  Administered 2012-04-01: 1 g via INTRAVENOUS
  Filled 2012-04-01 (×2): qty 10

## 2012-04-01 MED ORDER — SODIUM CHLORIDE 0.9 % IV SOLN
INTRAVENOUS | Status: DC
  Administered 2012-04-01: 19:00:00 via INTRAVENOUS

## 2012-04-01 MED ORDER — ONDANSETRON HCL 8 MG PO TABS
4.0000 mg | ORAL_TABLET | Freq: Four times a day (QID) | ORAL | Status: DC | PRN
  Filled 2012-04-01: qty 0.5

## 2012-04-01 MED ORDER — DEXTROSE 5 % IV SOLN
1.0000 g | Freq: Once | INTRAVENOUS | Status: AC
  Administered 2012-04-01: 1 g via INTRAVENOUS
  Filled 2012-04-01: qty 10

## 2012-04-01 MED ORDER — ACETAMINOPHEN 650 MG RE SUPP: 650.0000 mg | Freq: Four times a day (QID) | RECTAL | Status: DC | PRN

## 2012-04-01 MED ORDER — SODIUM CHLORIDE 0.9 % IJ SOLN: 3.0000 mL | Freq: Two times a day (BID) | INTRAMUSCULAR | Status: DC

## 2012-04-01 MED ORDER — ACETAMINOPHEN 325 MG PO TABS
650.0000 mg | ORAL_TABLET | Freq: Four times a day (QID) | ORAL | Status: DC | PRN
  Administered 2012-04-01 – 2012-04-02 (×3): 650 mg via ORAL
  Filled 2012-04-01 (×2): qty 2

## 2012-04-01 MED ORDER — HYDROMORPHONE HCL PF 1 MG/ML IJ SOLN
0.5000 mg | Freq: Once | INTRAMUSCULAR | Status: AC
  Administered 2012-04-01: 0.5 mg via INTRAVENOUS
  Filled 2012-04-01: qty 1

## 2012-04-01 MED ORDER — ONDANSETRON 4 MG PO TBDP
2.0000 mg | ORAL_TABLET | Freq: Once | ORAL | Status: AC
  Administered 2012-04-01: 2 mg via ORAL
  Filled 2012-04-01: qty 1

## 2012-04-01 MED ORDER — PRENATAL MULTIVITAMIN CH
1.0000 | ORAL_TABLET | Freq: Every day | ORAL | Status: DC
  Administered 2012-04-02: 1 via ORAL
  Filled 2012-04-01: qty 1

## 2012-04-01 MED ORDER — SODIUM CHLORIDE 0.9 % IV SOLN
INTRAVENOUS | Status: DC
  Administered 2012-04-01 – 2012-04-02 (×2): via INTRAVENOUS

## 2012-04-01 NOTE — ED Notes (Signed)
Dr Clydene Pugh in at bedside.

## 2012-04-01 NOTE — Consult Note (Signed)
Urology Consult  Requesting provider:  Dr. Hyacinth Meeker  CC: Right hydronephrosis. UTI. Possible right nephrolithiasis.  HPI: 37 year old female who is [redacted] weeks pregnant presents with 1 week history of right flank pain. This started as dull and has progressed to become sharp. No associated nausea, emesis, or gross hematuria. Better with narcotics. No personal history of stones. Her UA tonight is concerning for infection. She has a mild leukocytosis at 12.5. Her vital signs are stable and she is not labile. She had a renal US which is concerning for right hydronephrosis and possible large renal stone obstructing her renal pelvis.   We discussed the limitations of [place] in detecting stones and the possibility that this may not be a stag-horn stone. We reviewed options including observation with IV antibiotics, retrograde ureter stent placement, or percutaneous nephrostomy tube placement on the right. We discussed the risks, benefits, and side effects of each option including the risk to the fetus. I explained that if a stone were present, it would not be able to be treated until after her pregnancy was complete. I explained that foreign bodies in the urinary tract in pregnancy tend to become encrusted and obstructed. This results in the need for exchange on a regular basis. The frequency of exchange is difficult to determine, but could be as frequent as every 3-4 weeks.  PMH: Past Medical History  Diagnosis Date  . Hypertension   . Ectopic pregnancy     PSH: Past Surgical History  Procedure Laterality Date  . Cesarean section      Allergies: Allergies  Allergen Reactions  . Penicillins Other (See Comments)    unknown    Medications:  (Not in a hospital admission)   Social History: History   Social History  . Marital Status: Single    Spouse Name: N/A    Number of Children: N/A  . Years of Education: N/A   Occupational History  . Not on file.   Social History Main Topics  .  Smoking status: Current Every Day Smoker  . Smokeless tobacco: Not on file  . Alcohol Use: Yes     Comment: socially  . Drug Use: No  . Sexually Active:    Other Topics Concern  . Not on file   Social History Narrative  . No narrative on file    Family History: No family history on file.  Review of Systems: Positive: Right flank pain. Negative: Fever, chest pain, or SOB.  A further 10 point review of systems was negative except what is listed in the HPI.  Physical Exam: Filed Vitals:   04/01/12 2130  BP: 153/77  Pulse: 74  Temp:   Resp:     General: No acute distress.  Awake. Head:  Normocephalic.  Atraumatic. ENT:  EOMI.  Mucous membranes moist Neck:  Supple.  No lymphadenopathy. CV:  S1 present. S2 present. Regular rate. Pulmonary: Equal effort bilaterally.  Clear to auscultation bilaterally. Abdomen: Soft.  Non- tender to palpation. Skin:  Normal turgor.  No visible rash. Extremity: No gross deformity of bilateral upper extremities.  No gross deformity of    bilateral lower extremities. Neurologic: Alert. Appropriate mood.   Studies:  Recent Labs     04/01/12  1523  HGB  12.2  WBC  12.5*  PLT  393    Recent Labs     04/01/12  1523  NA  138  K  3.5  CL  104  CO2  23  BUN  7  CREATININE  0.57  CALCIUM  9.5  GFRNONAA  >90  GFRAA  >90     No results found for this basename: PT, INR, APTT,  in the last 72 hours   No components found with this basename: ABG,     Assessment:  UTI. Right hydronephrosis. Possible right stag-horn calculus.  Plan: -Obtain blood and urine cultures. -Make NPO. -Obtain PT/INR & aPTT studies. -Recommend admission to OB. -Patient would like to have a right percutaneous nephrostomy tube placed. I do not think this is urgent tonight. I have spoken with Dr. Grace Isaac who will pass this along to Interventional Radiology tomorrow to put her on the schedule. -Will follow along.    Pager: [phone]    CC: Dr. Hyacinth Meeker

## 2012-04-01 NOTE — ED Notes (Signed)
C/o intermittent right flank pain radiates to RUQ since Tues. Reports pain progressively worsening & becoming constant. Denies n/v/d, fever, chills, dysuria, urinary frequency, vaginal discharge or bleeding. States is [redacted] weeks pregnant, EDD 10-24-12.  Pain worsens with mvmt & laying on that side.

## 2012-04-01 NOTE — ED Notes (Signed)
Patient to ambulate to restroom to give urine sample.

## 2012-04-01 NOTE — ED Notes (Signed)
Patient report obtained from Monica, care taken over at this time.  

## 2012-04-01 NOTE — ED Provider Notes (Signed)
37 year old female G6 P3 at 10 weeks per the patient's history and received an ultrasound at [facility] orthopedics delineating the presence of an intrauterine pregnancy. She does have a history of ectopic pregnancy. She presents with intermittent right flank pain since 6 days ago. This has been fairly constant since Friday and the patient has been in significant discomfort with the inability to eat or drink secondary to pain and nausea. She is made less and less urine over the last several days. She really has no lower abdominal tenderness. On exam she has a soft nontender abdomen, mild tenderness over the right flank, clear heart and lung sounds with palpable pulses and normal capillary refill.  On my bedside ultrasound she appears to have hydronephrosis of the right kidney as compared to a normal-appearing left kidney and her intrauterine pregnancy appears consistent with data hCG as well as the presence of a fetal heartbeat which is barely visible but appears to be normal given gestational age.  Formal ultrasound has been ordered, urinalysis ordered, pain medications ordered.  Ultrasound shows unilateral hydronephrosis with a likely staghorn calculus. Her urinalysis confirms that she likely has an infection. I have discussed her care with the urologist Dr. Margarita Grizzle as well as with interventional radiologist Dr. Grace Isaac and OB/GYN doctor Meisinger, admission deferred to the internist, paged at this time.  Patient appears hemodynamically stable and does not appear septic from the infection  Blood cultures are requested by consultant  Medical screening examination/treatment/procedure(s) were conducted as a shared visit with non-physician practitioner(s) and myself.  I personally evaluated the patient during the encounter    Vida Roller, MD 04/01/12 2140

## 2012-04-01 NOTE — ED Notes (Addendum)
Pt sts right flank pain becoming more severe x 3 days; pt sts [redacted] weeks pregnant with ultrasound to confirm pregnancy; pt sts hx of ectopic in past; pt denies bleeding or discharge or pain with urination; pt G6 P3 M1 A1

## 2012-04-01 NOTE — H&P (Signed)
[facility] History and Physical  [name] ZOX:[id number] DOB: May 18, 1975 DOA: 04/01/2012  Referring physician: Dr.Miller. PCP: No PCP Per Patient  Specialists: Dr.[facility].  Chief Complaint: Right flank pain.  HPI: [name] is a 37 y.o. female with no significant past medical history presented the ER because of worsening right flank pain. Patient has been having flank pain since a week now which has been gradually worsening. Denies any associated nausea vomiting fever chills dysuria or diarrhea. In the ER renal sonogram revealed a right-sided renal stone with hydronephrosis. Patient's UA is compatible UTI. Patient is [redacted] weeks pregnant. On-call urologist Dr. Margarita Grizzle has been consulted. Patient's gynecologist on call was also consulted by the ED physician Dr. Hyacinth Meeker. Presently patient has been admitted for further workup given her complicated UTI with renal stone or hydronephrosis. Patient recently does not look septic. Patient otherwise denies any chest pain shortness of breath.  Review of Systems: As presented in the history of presenting illness, rest negative.  Past Medical History  Diagnosis Date  . Hypertension   . Ectopic pregnancy    Past Surgical History  Procedure Laterality Date  . Cesarean section     Social History:  reports that she has been smoking.  She does not have any smokeless tobacco history on file. She reports that  drinks alcohol. She reports that she does not use illicit drugs. Lives at home. where does patient live-- Can do ADLs. Can patient participate in ADLs?  Allergies  Allergen Reactions  . Penicillins Other (See Comments)    unknown    Family History  Problem Relation Age of Onset  . Diabetes type II Mother   . Hypertension Mother   . Diabetes type II Sister       Prior to Admission medications   Medication Sig Start Date End Date Taking? Authorizing Provider  acetaminophen (TYLENOL) 500 MG tablet Take 500 mg by  mouth every 6 (six) hours as needed for pain.   Yes Historical Provider, MD  diphenhydramine-acetaminophen (TYLENOL PM) 25-500 MG TABS Take 1 tablet by mouth at bedtime as needed (for pain).   Yes Historical Provider, MD  Prenatal Vit-Fe Fumarate-FA (PRENATAL MULTIVITAMIN) TABS Take 1 tablet by mouth daily at 12 noon.   Yes Historical Provider, MD   Physical Exam: Filed Vitals:   04/01/12 1933 04/01/12 2030 04/01/12 2100 04/01/12 2130  BP: 107/82 122/73 135/69 153/77  Pulse: 75 79 78 74  Temp: 98.6 F (37 C)     TempSrc: Oral     Resp: 18     SpO2: 99% 100% 100% 100%     General:  Well-developed well-nourished.  Eyes: Anicteric no pallor.  ENT: No discharge from ears eyes nose or mouth.  Neck: No JVD appreciated.  Cardiovascular: S1-S2 heard.  Respiratory: No rhonchi no crepitations.  Abdomen: Soft nontender bowel sounds present.  Skin: No rash appreciated.  Musculoskeletal: No edema.  Psychiatric: Appears normal.  Neurologic: Alert awake oriented to time place and person. Moves all extremities.  Labs on Admission:  Basic Metabolic Panel:  Recent Labs Lab 04/01/12 1523  NA 138  K 3.5  CL 104  CO2 23  GLUCOSE 96  BUN 7  CREATININE 0.57  CALCIUM 9.5   Liver Function Tests:  Recent Labs Lab 04/01/12 1523  AST 16  ALT 14  ALKPHOS 58  BILITOT 0.4  PROT 7.2  ALBUMIN 3.8   No results found for this basename: LIPASE, AMYLASE,  in the last 168 hours No  results found for this basename: AMMONIA,  in the last 168 hours CBC:  Recent Labs Lab 04/01/12 1523  WBC 12.5*  NEUTROABS 8.8*  HGB 12.2  HCT 34.4*  MCV 76.6*  PLT 393   Cardiac Enzymes: No results found for this basename: CKTOTAL, CKMB, CKMBINDEX, TROPONINI,  in the last 168 hours  BNP (last 3 results) No results found for this basename: PROBNP,  in the last 8760 hours CBG: No results found for this basename: GLUCAP,  in the last 168 hours  Radiological Exams on Admission: US  Renal  04/01/2012  *RADIOLOGY REPORT*  Clinical Data: Flank pain  RENAL / URINARY TRACT ULTRASOUND  Technique:  Complete ultrasound exam of the kidneys and urinary bladder was performed.  Comparison: No comparison studies available.  Findings:  The right kidney measures 10.9 cm in long axis.  The left kidney measures 11.4 cm.  There is fullness of the right intrarenal collecting system.  Hyperechoic shadowing material is identified in the collecting system of the right kidney. This is most likely related to a large staghorn calculus.  As there appears to wrapped around the structure on some of the images, I do not think it represents gas within the collecting system.  Left kidney is sonographically normal in appearance.  Bladder is decompressed.  Intrauterine gestational sac noted incidentally in the uterus.  Impression:  Echogenic material in the right intrarenal collecting system and appears to fill central calyces of the kidney.  This appearance is most likely related to a large staghorn calculus as urine can be seen encasing the echogenic material on some images. There is associated mild to moderate hydronephrosis on the right.  I discussed these findings by telephone with Raymon Mutton at 1930 hours on 04/01/2012.   Original Report Authenticated By: Kennith Center, M.D.      Assessment/Plan Principal Problem:   UTI (lower urinary tract infection) Active Problems:   Renal stone   Hydronephrosis   Pregnancy   1. Complicated UTI with right-sided hydronephrosis and renal stone - patient will be continued with IV ceftriaxone. Follow urine culture. Further recommendations per urologist. In anticipation of possible procedure patient will be kept n.p.o. past midnight. 2. Pregnancy, 10 weeks  3. Recently quit smoking.    Code Status:  Full code.  Family Communication: None.  Disposition Plan: Admit to inpatient.    Jacalynn Buzzell N. [facility] Pager [phone].  If 7PM-7AM, please  contact night-coverage [email or web address] Password [facility] 04/01/2012, 10:21 PM

## 2012-04-01 NOTE — ED Notes (Signed)
Patient report called for admission to [facility] for admission. Nurse voiced understanding of report and had no further questions. Patient is in stable condition at this time for transport. Patient will be going via stretcher to room 5509.

## 2012-04-01 NOTE — ED Notes (Signed)
Patient transported to Ultrasound 

## 2012-04-01 NOTE — ED Provider Notes (Signed)
History     CSN: [id number]  Arrival date & time 04/01/12  1500   First MD Initiated Contact with Patient 04/01/12 1658      Chief Complaint  Patient presents with  . Flank Pain    (Consider location/radiation/quality/duration/timing/severity/associated sxs/prior treatment) HPI Comments: [name] is a 37 y/o F with PMHx of HTN and ectopic pregnancy, who is [redacted] weeks pregnant,  reporting to the ED with right flank pain that has been going since last Tuesday. Patient reported that in the beginning the pain was localized to the right flank that was an intermittent dull, achy pain that has gotten progressively worse since last Friday - described now as a constant, sharp, shooting/stabbing pain that radiates to the abdominal region, rated pain as 10/10. Patient reported using tylenol, ice, and heat with minimal relief - patient stated that sitting upright and laying on left side is the only way she finds relief of pain. Associated symptoms are decreased appetite and fatigue. Patient denied, vaginal discharge, vaginal bleeding, hematuria, dysuria, fever, chills, nausea, vomiting, diarrhea, constipation, chest pain, shortness of breathe, difficulty breathing, trauma, injury, travel, visual distortions, headaches, weakness to extremities, numbness, paresthesias.   Patient reported being [redacted] weeks pregnant, was confirmed at 7 weeks at [facility] via ultrasound - patient reported that pregnancy is going well and that fetus is present in the uterus. Patient reported LMP to be in late December of 2013 - due date is October 24, 2012. [id number] - patient reported one abortion in 2005, accomplished with vacuum technique, patient reported ectopic pregnancy in 2005. Patient reported right fallopian tube removed in 2012. Patient reported Cesarean sections with all full term pregnancies, without complications. Patient reported pre-eclampsia in 2012 shortly after delivery of her child. Patient denied  complications during pregnancies - denied pre-eclampsia and gestational diabetes during pregnancies.   Patient is a 37 y.o. female presenting with flank pain. The history is provided by the patient. No language interpreter was used.  Flank Pain This is a new problem. The current episode started in the past 7 days. The problem occurs constantly. The problem has been gradually worsening. Associated symptoms include abdominal pain and fatigue. Pertinent negatives include no change in bowel habit, chest pain, chills, coughing, fever, headaches, nausea, neck pain, numbness, sore throat, urinary symptoms, visual change, vomiting or weakness. Exacerbated by: Laying on right side. She has tried acetaminophen, heat and ice for the symptoms. The treatment provided no relief.    Past Medical History  Diagnosis Date  . Hypertension   . Ectopic pregnancy     Past Surgical History  Procedure Laterality Date  . Cesarean section      Family History  Problem Relation Age of Onset  . Diabetes type II Mother   . Hypertension Mother   . Diabetes type II Sister     History  Substance Use Topics  . Smoking status: Current Every Day Smoker -- 10 years  . Smokeless tobacco: Not on file     Comment: patient states she only smokes 1 cigarette a day  . Alcohol Use: No    OB History   Grav Para Term Preterm Abortions TAB SAB Ect Mult Living                  Review of Systems  Constitutional: Positive for fatigue. Negative for fever and chills.  HENT: Negative for ear pain, sore throat, neck pain and tinnitus.   Eyes: Negative for photophobia, pain and visual disturbance.  Respiratory:  Negative for cough and chest tightness.   Cardiovascular: Negative for chest pain.  Gastrointestinal: Positive for abdominal pain. Negative for nausea, vomiting and change in bowel habit.  Genitourinary: Positive for flank pain. Negative for dysuria, frequency, hematuria, vaginal bleeding, vaginal discharge,  difficulty urinating and vaginal pain.  Musculoskeletal: Positive for back pain.       Right flank pain with radiation to the abdomen.   Neurological: Negative for weakness, numbness and headaches.  All other systems reviewed and are negative.    Allergies  Review of patient's allergies indicates no active allergies.  Home Medications   No current outpatient prescriptions on file.  BP 153/77  Pulse 71  Temp(Src) 98.6 F (37 C) (Oral)  Resp 18  SpO2 100%  LMP 12/20/2011  Physical Exam  Nursing note and vitals reviewed. Constitutional: She is oriented to person, place, and time. She appears well-developed and well-nourished. No distress.  HENT:  Head: Normocephalic and atraumatic.  Mouth/Throat: Oropharynx is clear and moist. No oropharyngeal exudate.  Eyes: Conjunctivae and EOM are normal. Pupils are equal, round, and reactive to light. Right eye exhibits no discharge. Left eye exhibits no discharge.  Neck: Normal range of motion. Neck supple. No tracheal deviation present.  Cardiovascular: Normal rate, regular rhythm, normal heart sounds and intact distal pulses.  Exam reveals no friction rub.   No murmur heard. Pulmonary/Chest: Effort normal and breath sounds normal. No respiratory distress. She has no wheezes. She has no rales.  Abdominal: Soft. Bowel sounds are normal. She exhibits no distension. There is no tenderness. There is no rebound and no guarding.  No pain upon palpation to all 4 quadrants Negative McBurney's point Negative Murphy's sign Negative Psoas sign Negative Obtruator sign  Musculoskeletal: She exhibits no edema and no tenderness.  Pain upon palpation to the right flank  No CVA tenderness.  Lymphadenopathy:    She has no cervical adenopathy.  Neurological: She is alert and oriented to person, place, and time. No cranial nerve deficit. She exhibits normal muscle tone. Coordination normal.  Skin: Skin is warm and dry. No rash noted. She is not  diaphoretic. No erythema.  Psychiatric: She has a normal mood and affect. Her behavior is normal. Thought content normal.    ED Course  Procedures (including critical care time)  Labs Reviewed  CBC WITH DIFFERENTIAL - Abnormal; Notable for the following:    WBC 12.5 (*)    HCT 34.4 (*)    MCV 76.6 (*)    Neutro Abs 8.8 (*)    All other components within normal limits  URINALYSIS, MICROSCOPIC ONLY - Abnormal; Notable for the following:    APPearance CLOUDY (*)    Hgb urine dipstick TRACE (*)    Ketones, ur 15 (*)    Nitrite POSITIVE (*)    Leukocytes, UA LARGE (*)    Bacteria, UA MANY (*)    Squamous Epithelial / LPF FEW (*)    All other components within normal limits  HCG, QUANTITATIVE, PREGNANCY - Abnormal; Notable for the following:    hCG, Beta Chain, Quant, [place] [address] (*)    All other components within normal limits  URINE CULTURE  CULTURE, BLOOD (ROUTINE X 2)  CULTURE, BLOOD (ROUTINE X 2)  COMPREHENSIVE METABOLIC PANEL  APTT  PROTIME-INR  BASIC METABOLIC PANEL  CBC   US Renal  04/01/2012  *RADIOLOGY REPORT*  Clinical Data: Flank pain  RENAL / URINARY TRACT ULTRASOUND  Technique:  Complete ultrasound exam of the kidneys and urinary bladder  was performed.  Comparison: No comparison studies available.  Findings:  The right kidney measures 10.9 cm in long axis.  The left kidney measures 11.4 cm.  There is fullness of the right intrarenal collecting system.  Hyperechoic shadowing material is identified in the collecting system of the right kidney. This is most likely related to a large staghorn calculus.  As there appears to wrapped around the structure on some of the images, I do not think it represents gas within the collecting system.  Left kidney is sonographically normal in appearance.  Bladder is decompressed.  Intrauterine gestational sac noted incidentally in the uterus.  Impression:  Echogenic material in the right intrarenal collecting system and appears to fill central  calyces of the kidney.  This appearance is most likely related to a large staghorn calculus as urine can be seen encasing the echogenic material on some images. There is associated mild to moderate hydronephrosis on the right.  I discussed these findings by telephone with Raymon Mutton at 1930 hours on 04/01/2012.   Original Report Authenticated By: Kennith Center, M.D.    Results for orders placed during the hospital encounter of 04/01/12  CBC WITH DIFFERENTIAL      Result Value Range   WBC 12.5 (*) 4.0 - 10.5 K/uL   RBC 4.49  3.87 - 5.11 MIL/uL   Hemoglobin 12.2  12.0 - 15.0 g/dL   HCT 09.8 (*) 11.9 - 14.7 %   MCV 76.6 (*) 78.0 - 100.0 fL   MCH 27.2  26.0 - 34.0 pg   MCHC 35.5  30.0 - 36.0 g/dL   RDW 82.9  56.2 - 13.0 %   Platelets 393  150 - 400 K/uL   Neutrophils Relative 70  43 - 77 %   Neutro Abs 8.8 (*) 1.7 - 7.7 K/uL   Lymphocytes Relative 22  12 - 46 %   Lymphs Abs 2.7  0.7 - 4.0 K/uL   Monocytes Relative 5  3 - 12 %   Monocytes Absolute 0.7  0.1 - 1.0 K/uL   Eosinophils Relative 3  0 - 5 %   Eosinophils Absolute 0.3  0.0 - 0.7 K/uL   Basophils Relative 0  0 - 1 %   Basophils Absolute 0.0  0.0 - 0.1 K/uL  COMPREHENSIVE METABOLIC PANEL      Result Value Range   Sodium 138  135 - 145 mEq/L   Potassium 3.5  3.5 - 5.1 mEq/L   Chloride 104  96 - 112 mEq/L   CO2 23  19 - 32 mEq/L   Glucose, Bld 96  70 - 99 mg/dL   BUN 7  6 - 23 mg/dL   Creatinine, Ser 8.65  0.50 - 1.10 mg/dL   Calcium 9.5  8.4 - 78.4 mg/dL   Total Protein 7.2  6.0 - 8.3 g/dL   Albumin 3.8  3.5 - 5.2 g/dL   AST 16  0 - 37 U/L   ALT 14  0 - 35 U/L   Alkaline Phosphatase 58  39 - 117 U/L   Total Bilirubin 0.4  0.3 - 1.2 mg/dL   GFR calc non Af Amer >90  >90 mL/min   GFR calc Af Amer >90  >90 mL/min  URINALYSIS, MICROSCOPIC ONLY      Result Value Range   Color, Urine YELLOW  YELLOW   APPearance CLOUDY (*) CLEAR   Specific Gravity, Urine 1.017  1.005 - 1.030   pH 7.0  5.0 - 8.0  Glucose, UA NEGATIVE   NEGATIVE mg/dL   Hgb urine dipstick TRACE (*) NEGATIVE   Bilirubin Urine NEGATIVE  NEGATIVE   Ketones, ur 15 (*) NEGATIVE mg/dL   Protein, ur NEGATIVE  NEGATIVE mg/dL   Urobilinogen, UA 0.2  0.0 - 1.0 mg/dL   Nitrite POSITIVE (*) NEGATIVE   Leukocytes, UA LARGE (*) NEGATIVE   WBC, UA 11-20  <3 WBC/hpf   RBC / HPF 0-2  <3 RBC/hpf   Bacteria, UA MANY (*) RARE   Squamous Epithelial / LPF FEW (*) RARE   Urine-Other AMORPHOUS URATES/PHOSPHATES    HCG, QUANTITATIVE, PREGNANCY      Result Value Range   hCG, Beta Chain, Quant, [place] [address] (*) <5 mIU/mL  APTT      Result Value Range   aPTT 29  24 - 37 seconds  PROTIME-INR      Result Value Range   Prothrombin Time 13.9  11.6 - 15.2 seconds   INR 1.08  0.00 - 1.49   US Renal  04/01/2012  *RADIOLOGY REPORT*  Clinical Data: Flank pain  RENAL / URINARY TRACT ULTRASOUND  Technique:  Complete ultrasound exam of the kidneys and urinary bladder was performed.  Comparison: No comparison studies available.  Findings:  The right kidney measures 10.9 cm in long axis.  The left kidney measures 11.4 cm.  There is fullness of the right intrarenal collecting system.  Hyperechoic shadowing material is identified in the collecting system of the right kidney. This is most likely related to a large staghorn calculus.  As there appears to wrapped around the structure on some of the images, I do not think it represents gas within the collecting system.  Left kidney is sonographically normal in appearance.  Bladder is decompressed.  Intrauterine gestational sac noted incidentally in the uterus.  Impression:  Echogenic material in the right intrarenal collecting system and appears to fill central calyces of the kidney.  This appearance is most likely related to a large staghorn calculus as urine can be seen encasing the echogenic material on some images. There is associated mild to moderate hydronephrosis on the right.  I discussed these findings by telephone with Raymon Mutton at 1930 hours on 04/01/2012.   Original Report Authenticated By: Kennith Center, M.D.      1. UTI (lower urinary tract infection)   2. Hydronephrosis   3. Pregnancy   4. Renal stone       MDM  DDx: Nephrolithiasis Ectopic pregnancy UTI Musculoskeletal cause   I personally evaluated and examined the patient. Patient appeared to be alert, oriented, and calm. No CVA tenderness noted. Pain upon palpation to the right flank with radiation to the abdomen. Negative McBurney's point, Murphy's sign, Psoas sign, Obtruator sign. No sign of peritonitis.  IV saline lock given - NS, dilaudid, zofran given for pain/nausea of patient.  CMP negative findings.  CBC: Hgb and Hct low - trend seen when labs were reviewed; mildly elevated WBC (12.5). Beta-HCG levels elevated due to pregnancy (60454). UA showed trace of Hgb, positive ketones, positive nitrites, large number of leukocytes, many bacteria present.  Urine sent for culture.  Discussed findings of ultrasound with Dr. Molli Posey at 7:26pm regarding findings to renal US - Staghorn calculus, approximately 4 cm, found in right kidney with mild to moderate hydronephrosis.    With increased WBC, urine positive for nitrites/bacteria/leukocytes/ketones/trace of Hgb with presence of staghorn calculus to right kidney - patient suspected to have pyelonephrosis. Dicussed case and findings with Dr. Hyacinth Meeker.  Dr. Hyacinth Meeker spoke with urology - Dr. Margarita Grizzle, interventional radiology - Dr. Grace Isaac, and obstetrics/gynecology Dr. Jackelyn Knife about patient's care.  Patient is to be admitted to Internal Medicine, attending Dr. Toniann Fail.         [employer], PA-C 04/02/12 0013

## 2012-04-01 NOTE — ED Notes (Signed)
Patient resting on stretcher at this time. Patient denies any pain at this time, states the pain was in her right flank area. Patient is tearful at this time, states she is emotional and pregnant. Call light at bedside with patient. Will continue to monitor.

## 2012-04-02 ENCOUNTER — Encounter (HOSPITAL_COMMUNITY): Payer: Self-pay | Admitting: Radiology

## 2012-04-02 LAB — BASIC METABOLIC PANEL
Chloride: 103 mEq/L (ref 96–112)
GFR calc Af Amer: >90 mL/min (ref >90)
Potassium: 3 mEq/L — ABNORMAL LOW (ref 3.5–5.1)
Sodium: 137 mEq/L (ref 135–145)

## 2012-04-02 LAB — CBC
HCT: 31.9 % — ABNORMAL LOW (ref 36.0–46.0)
Hemoglobin: 11.2 g/dL — ABNORMAL LOW (ref 12.0–15.0)
RDW: 14.7 % (ref 11.5–15.5)
WBC: 11.1 10*3/uL — ABNORMAL HIGH (ref 4.0–10.5)

## 2012-04-02 LAB — GLUCOSE, CAPILLARY: Glucose-Capillary: 122 mg/dL — ABNORMAL HIGH (ref 70–99)

## 2012-04-02 MED ORDER — POTASSIUM CHLORIDE 10 MEQ/100ML IV SOLN
10.0000 meq | INTRAVENOUS | Status: AC
  Administered 2012-04-02 (×2): 10 meq via INTRAVENOUS
  Filled 2012-04-02 (×4): qty 100

## 2012-04-02 MED ORDER — NITROFURANTOIN MONOHYD MACRO 100 MG PO CAPS: 100.0000 mg | ORAL_CAPSULE | Freq: Two times a day (BID) | ORAL | Status: DC

## 2012-04-02 NOTE — ED Provider Notes (Signed)
Medical screening examination/treatment/procedure(s) were conducted as a shared visit with non-physician practitioner(s) and myself.  I personally evaluated the patient during the encounter  Please see my separate respective documentation pertaining to this patient encounter   Vida Roller, MD 04/02/12 [phone]

## 2012-04-02 NOTE — H&P (Signed)
See additional progress note 

## 2012-04-02 NOTE — Progress Notes (Signed)
NURSING PROGRESS NOTE  Zynasia Burklow [id number] Discharge Data: 04/02/2012 2:03 PM Attending Provider: Micael Hampshire Acost* PCP:No PCP Per Patient     Delories Heinz to be D/C'd Home per MD order.  Discussed with the patient the After Visit Summary and all questions fully answered. All IV's discontinued with no bleeding noted. All belongings returned to patient for patient to take home.   Last Vital Signs:  Blood pressure 111/62, pulse 67, temperature 98.4 F (36.9 C), temperature source Oral, resp. rate 16, height 5' (1.524 m), weight 71.1 kg (156 lb 12 oz), last menstrual period 12/20/2011, SpO2 100.00%.  Discharge Medication List   Medication List    STOP taking these medications       diphenhydramine-acetaminophen 25-500 MG Tabs  Commonly known as:  TYLENOL PM      TAKE these medications       acetaminophen 500 MG tablet  Commonly known as:  TYLENOL  Take 500 mg by mouth every 6 (six) hours as needed for pain.     nitrofurantoin (macrocrystal-monohydrate) 100 MG capsule  Commonly known as:  MACROBID  Take 1 capsule (100 mg total) by mouth 2 (two) times daily.     prenatal multivitamin Tabs  Take 1 tablet by mouth daily at 12 noon.

## 2012-04-02 NOTE — Progress Notes (Signed)
Patient ID: [name], female   DOB: 1975-09-08, 37 y.o.   MRN: [id number]  Chart and imaging reviewed.  Case discussed with Dr. Senaida Ores with OB/GYN as well.  Ultrasound really shows only dilatation of the upper pole collecting system and a probable large staghorn calculus filling the entire central kidney.  Percutaneous nephrostomy would be technically quite difficult at this point.  Recommend treatment of UTI/pyelo and consideration of PCN only if the patient develops progressive hydronephrosis by ultrasound or signs of progressive pyonephrosis despite adequate antibiotic treatment.

## 2012-04-02 NOTE — H&P (Addendum)
After discussion with Dr. Fredia Sorrow, he recommends conservative mgmt as well. Would really want to avoid any intervention unless absolutely necessary in this pt.  If worsening hydronephrosis by repeat US or develops renal insufficiency, then could opt for drainage. Pt could likely be discharged and followed as outpt.   HPI: [name] is an 37 y.o. female who is approx 10-[redacted] weeks pregnant but has presented with rt flank pain. She is found to have right sided hydronephrosis and renal stone. Urology has seen pt and has discussed PCN with pt but per note, felt conservative observation would be reasonable at this point. The pt is opting for nephrostomy and IR is consulted. Pt's OB has not seen pt and per Urology notes, will need to be on board with plan/mgmt of PCN drain. Pt still c/o pain this am, no fever, chills. PMHx and meds reviewed.  Past Medical History:  Past Medical History  Diagnosis Date  . Hypertension   . Ectopic pregnancy     Past Surgical History:  Past Surgical History  Procedure Laterality Date  . Cesarean section      Family History:  Family History  Problem Relation Age of Onset  . Diabetes type II Mother   . Hypertension Mother   . Diabetes type II Sister     Social History:  reports that she has been smoking.  She does not have any smokeless tobacco history on file. She reports that she does not drink alcohol or use illicit drugs.  Allergies: No Known Allergies  Medications: Medications Prior to Admission  Medication Sig Dispense Refill  . acetaminophen (TYLENOL) 500 MG tablet Take 500 mg by mouth every 6 (six) hours as needed for pain.      . diphenhydramine-acetaminophen (TYLENOL PM) 25-500 MG TABS Take 1 tablet by mouth at bedtime as needed (for pain).      . Prenatal Vit-Fe Fumarate-FA (PRENATAL MULTIVITAMIN) TABS Take 1 tablet by mouth daily at 12 noon.        Please HPI for pertinent positives, otherwise complete 10 system ROS negative.  Physical  Exam: Blood pressure 111/62, pulse 67, temperature 98.4 F (36.9 C), temperature source Oral, resp. rate 16, height 5' (1.524 m), weight 156 lb 12 oz (71.1 kg), last menstrual period 12/20/2011, SpO2 100.00%. Body mass index is 30.61 kg/(m^2).   General Appearance:  Alert, cooperative, no distress, appears stated age  Head:  Normocephalic, without obvious abnormality, atraumatic  ENT: Unremarkable  Neck: Supple, symmetrical, trachea midline, no adenopathy, thyroid: not enlarged, symmetric, no tenderness/mass/nodules  Lungs:   Clear to auscultation bilaterally, no w/r/r, respirations unlabored without use of accessory muscles.  Heart:  Regular rate and rhythm, S1, S2 normal, no murmur, rub or gallop. Carotids 2+ without bruit.  Abdomen:   Soft, non-tender, non distended.   Neurologic: Normal affect, no gross deficits.   Results for orders placed during the hospital encounter of 04/01/12 (from the past 48 hour(s))  CBC WITH DIFFERENTIAL     Status: Abnormal   Collection Time    04/01/12  3:23 PM      Result Value Range   WBC 12.5 (*) 4.0 - 10.5 K/uL   RBC 4.49  3.87 - 5.11 MIL/uL   Hemoglobin 12.2  12.0 - 15.0 g/dL   HCT 16.1 (*) 09.6 - 04.5 %   MCV 76.6 (*) 78.0 - 100.0 fL   MCH 27.2  26.0 - 34.0 pg   MCHC 35.5  30.0 - 36.0 g/dL   RDW 40.9  11.5 - 15.5 %   Platelets 393  150 - 400 K/uL   Neutrophils Relative 70  43 - 77 %   Neutro Abs 8.8 (*) 1.7 - 7.7 K/uL   Lymphocytes Relative 22  12 - 46 %   Lymphs Abs 2.7  0.7 - 4.0 K/uL   Monocytes Relative 5  3 - 12 %   Monocytes Absolute 0.7  0.1 - 1.0 K/uL   Eosinophils Relative 3  0 - 5 %   Eosinophils Absolute 0.3  0.0 - 0.7 K/uL   Basophils Relative 0  0 - 1 %   Basophils Absolute 0.0  0.0 - 0.1 K/uL  COMPREHENSIVE METABOLIC PANEL     Status: None   Collection Time    04/01/12  3:23 PM      Result Value Range   Sodium 138  135 - 145 mEq/L   Potassium 3.5  3.5 - 5.1 mEq/L   Chloride 104  96 - 112 mEq/L   CO2 23  19 - 32 mEq/L    Glucose, Bld 96  70 - 99 mg/dL   BUN 7  6 - 23 mg/dL   Creatinine, Ser 1.61  0.50 - 1.10 mg/dL   Calcium 9.5  8.4 - 09.6 mg/dL   Total Protein 7.2  6.0 - 8.3 g/dL   Albumin 3.8  3.5 - 5.2 g/dL   AST 16  0 - 37 U/L   ALT 14  0 - 35 U/L   Alkaline Phosphatase 58  39 - 117 U/L   Total Bilirubin 0.4  0.3 - 1.2 mg/dL   GFR calc non Af Amer >90  >90 mL/min   GFR calc Af Amer >90  >90 mL/min   Comment:            The eGFR has been calculated     using the CKD EPI equation.     This calculation has not been     validated in all clinical     situations.     eGFR's persistently     <90 mL/min signify     possible Chronic Kidney Disease.  HCG, QUANTITATIVE, PREGNANCY     Status: Abnormal   Collection Time    04/01/12  3:24 PM      Result Value Range   hCG, Beta Chain, Quant, S 04540 (*) <5 mIU/mL   Comment:              GEST. AGE      CONC.  (mIU/mL)       <=1 WEEK        5 - 50         2 WEEKS       50 - 500         3 WEEKS       100 - 10,000         4 WEEKS     1,000 - 30,000         5 WEEKS     3,500 - 115,000       6-8 WEEKS     12,000 - 270,000        12 WEEKS     15,000 - 220,000                FEMALE AND NON-PREGNANT FEMALE:         LESS THAN 5 mIU/mL  URINALYSIS, MICROSCOPIC ONLY     Status: Abnormal   Collection Time  04/01/12  7:27 PM      Result Value Range   Color, Urine YELLOW  YELLOW   APPearance CLOUDY (*) CLEAR   Specific Gravity, Urine 1.017  1.005 - 1.030   pH 7.0  5.0 - 8.0   Glucose, UA NEGATIVE  NEGATIVE mg/dL   Hgb urine dipstick TRACE (*) NEGATIVE   Bilirubin Urine NEGATIVE  NEGATIVE   Ketones, ur 15 (*) NEGATIVE mg/dL   Protein, ur NEGATIVE  NEGATIVE mg/dL   Urobilinogen, UA 0.2  0.0 - 1.0 mg/dL   Nitrite POSITIVE (*) NEGATIVE   Leukocytes, UA LARGE (*) NEGATIVE   WBC, UA 11-20  <3 WBC/hpf   RBC / HPF 0-2  <3 RBC/hpf   Bacteria, UA MANY (*) RARE   Squamous Epithelial / LPF FEW (*) RARE   Urine-Other AMORPHOUS URATES/PHOSPHATES    APTT      Status: None   Collection Time    04/01/12  9:45 PM      Result Value Range   aPTT 29  24 - 37 seconds  PROTIME-INR     Status: None   Collection Time    04/01/12  9:45 PM      Result Value Range   Prothrombin Time 13.9  11.6 - 15.2 seconds   INR 1.08  0.00 - 1.49  GLUCOSE, CAPILLARY     Status: Abnormal   Collection Time    04/02/12 12:11 AM      Result Value Range   Glucose-Capillary 122 (*) 70 - 99 mg/dL  BASIC METABOLIC PANEL     Status: Abnormal   Collection Time    04/02/12  5:28 AM      Result Value Range   Sodium 137  135 - 145 mEq/L   Potassium 3.0 (*) 3.5 - 5.1 mEq/L   Chloride 103  96 - 112 mEq/L   CO2 23  19 - 32 mEq/L   Glucose, Bld 75  70 - 99 mg/dL   BUN 6  6 - 23 mg/dL   Creatinine, Ser 1.61  0.50 - 1.10 mg/dL   Calcium 8.3 (*) 8.4 - 10.5 mg/dL   GFR calc non Af Amer >90  >90 mL/min   GFR calc Af Amer >90  >90 mL/min   Comment:            The eGFR has been calculated     using the CKD EPI equation.     This calculation has not been     validated in all clinical     situations.     eGFR's persistently     <90 mL/min signify     possible Chronic Kidney Disease.  CBC     Status: Abnormal   Collection Time    04/02/12  5:28 AM      Result Value Range   WBC 11.1 (*) 4.0 - 10.5 K/uL   RBC 4.14  3.87 - 5.11 MIL/uL   Hemoglobin 11.2 (*) 12.0 - 15.0 g/dL   HCT 09.6 (*) 04.5 - 40.9 %   MCV 77.1 (*) 78.0 - 100.0 fL   MCH 27.1  26.0 - 34.0 pg   MCHC 35.1  30.0 - 36.0 g/dL   RDW 81.1  91.4 - 78.2 %   Platelets 357  150 - 400 K/uL  GLUCOSE, CAPILLARY     Status: None   Collection Time    04/02/12  6:26 AM      Result Value Range   Glucose-Capillary 88  70 -  99 mg/dL   Comment 1 Documented in Chart     Comment 2 Notify RN     US Renal  04/01/2012  *RADIOLOGY REPORT*  Clinical Data: Flank pain  RENAL / URINARY TRACT ULTRASOUND  Technique:  Complete ultrasound exam of the kidneys and urinary bladder was performed.  Comparison: No comparison studies  available.  Findings:  The right kidney measures 10.9 cm in long axis.  The left kidney measures 11.4 cm.  There is fullness of the right intrarenal collecting system.  Hyperechoic shadowing material is identified in the collecting system of the right kidney. This is most likely related to a large staghorn calculus.  As there appears to wrapped around the structure on some of the images, I do not think it represents gas within the collecting system.  Left kidney is sonographically normal in appearance.  Bladder is decompressed.  Intrauterine gestational sac noted incidentally in the uterus.  Impression:  Echogenic material in the right intrarenal collecting system and appears to fill central calyces of the kidney.  This appearance is most likely related to a large staghorn calculus as urine can be seen encasing the echogenic material on some images. There is associated mild to moderate hydronephrosis on the right.  I discussed these findings by telephone with Raymon Mutton at 1930 hours on 04/01/2012.   Original Report Authenticated By: Kennith Center, M.D.     Assessment/Plan Rt renal stone with mild/mod hydronephrosis Discussed (R)PCN procedure with pt, including risks, complications, and need for serial exchanges during duration of pregnancy. Discussed risks of infection, bleeding, use of sedation. Labs reviewed, ok. WBC slightly decreased this am. IR is available to do (R)PCN today if that is final decision. Keep NPO Agree that OB consult/recs be known prior to placement.  Brayton El PA-C 04/02/2012, 8:27 AM

## 2012-04-02 NOTE — Progress Notes (Signed)
Pt arrived to unit from ED. Pt is A&O, skin intact & VS stable. Pt is currentty resting comfortably in bed. Will continue to monitor.

## 2012-04-02 NOTE — H&P (Signed)
Agree 

## 2012-04-02 NOTE — Progress Notes (Signed)
Urology Progress Note  Subjective:     No acute urologic events overnight. No fever or chills. Continues to have right flank pain.  Given her stability, I have discussed continued observation instead of nephrostomy tube placement. Urinary diversion is still an option at this point as well.  ROS: Negative: chest pain or SOB.  Objective:  Patient Vitals for the past 24 hrs:  BP Temp Temp src Pulse Resp SpO2 Height Weight  04/02/12 0445 111/62 mmHg 98.4 F (36.9 C) Oral 67 16 100 % - -  04/02/12 0010 130/76 mmHg 98.4 F (36.9 C) Oral 70 18 100 % 5' (1.524 m) 71.1 kg (156 lb 12 oz)  04/01/12 2238 - - - 71 - 100 % - -  04/01/12 2130 153/77 mmHg - - 74 - 100 % - -  04/01/12 2100 135/69 mmHg - - 78 - 100 % - -  04/01/12 2030 122/73 mmHg - - 79 - 100 % - -  04/01/12 1933 107/82 mmHg 98.6 F (37 C) Oral 75 18 99 % - -  04/01/12 1915 124/77 mmHg - - 61 16 100 % - -  04/01/12 1800 128/64 mmHg - - 67 17 100 % - -  04/01/12 1715 146/77 mmHg - - 76 18 100 % - -  04/01/12 1521 148/82 mmHg 98.6 F (37 C) Oral 98 18 98 % - -    Physical Exam: General:  No acute distress, awake Cardiovascular:    [x]   S1/S2 present, RRR  []   Irregularly irregular Chest:  CTA-B Abdomen:               []  Soft, appropriately TTP  [x]  Soft, NTTP, right flank TTP  []  Soft, appropriately TTP, incision(s) clean/dry/intact  Genitourinary: No foley.        Recent Labs     04/01/12  1523  HGB  12.2  WBC  12.5*  PLT  393    Recent Labs     04/01/12  1523  NA  138  K  3.5  CL  104  CO2  23  BUN  7  CREATININE  0.57  CALCIUM  9.5  GFRNONAA  >90  GFRAA  >90     Recent Labs     04/01/12  2145  INR  1.08  APTT  29     No components found with this basename: ABG,     Length of stay: 1 days.  Assessment: UTI. Right hydronephrosis. Right nephrolithiasis.   Plan: -The patient does not wish to continue observation. She would like to proceed with right nephrostomy tube placement with  Interventional Radiology.  -I would recommend a consultation to her OB, Dr. Senaida Ores. I understand that the MD on call for that practice last night, Dr. Jackelyn Knife, did not feel she needed to be admitted to the [facility] service, but I do not believe they planned on following her for this admission. They will need to be on board for management as a nephrostomy tube may be present for the duration of her pregnancy and they will need to follow and manage cultures with appropriate antibiotics. My concern is for the safety of the fetus as well.  -I will continue to follow.   Natalia Leatherwood, MD [phone]

## 2012-04-02 NOTE — Care Management Note (Signed)
    Page 1 of 1   04/02/2012     3:15:25 PM   CARE MANAGEMENT NOTE 04/02/2012  Patient:  [name]   Account Number:  [id number]  Date Initiated:  04/02/2012  Documentation initiated by:  Letha Cape  Subjective/Objective Assessment:   dx uti, hydronephrosis  admit- lives with children.     Action/Plan:   Anticipated DC Date:  04/02/2012   Anticipated DC Plan:  HOME/SELF CARE      DC Planning Services  CM consult      Choice offered to / List presented to:             Status of service:  Completed, signed off Medicare Important Message given?   (If response is "NO", the following Medicare IM given date fields will be blank) Date Medicare IM given:   Date Additional Medicare IM given:    Discharge Disposition:  HOME/SELF CARE  Per UR Regulation:  Reviewed for med. necessity/level of care/duration of stay  If discussed at Long Length of Stay Meetings, dates discussed:    Comments:  04/02/12 15;14 Letha Cape RN, BSN [phone] patient lives with children at her house, pta indep. Patient has medication coverage and transportation at dc. No needs anticipated.

## 2012-04-02 NOTE — Discharge Summary (Signed)
Physician Discharge Summary  Patient ID: [name] MRN: [id number] DOB/AGE: 36/07/1975 36 y.o.  Admit date: 04/01/2012 Discharge date: 04/02/2012  Primary Care Physician:  No PCP Per Patient   Discharge Diagnoses:    Principal Problem:   UTI (lower urinary tract infection) Active Problems:   Renal stone   Hydronephrosis   Pregnancy      Medication List    STOP taking these medications       diphenhydramine-acetaminophen 25-500 MG Tabs  Commonly known as:  TYLENOL PM      TAKE these medications       acetaminophen 500 MG tablet  Commonly known as:  TYLENOL  Take 500 mg by mouth every 6 (six) hours as needed for pain.     nitrofurantoin (macrocrystal-monohydrate) 100 MG capsule  Commonly known as:  MACROBID  Take 1 capsule (100 mg total) by mouth 2 (two) times daily.     prenatal multivitamin Tabs  Take 1 tablet by mouth daily at 12 noon.         Disposition and Follow-up:  Will be discharged home today in stable condition. Has been advised to followup with her OB in the next couple days.  Consults:  Neurology, Dr. Margarita Grizzle Interventional radiology, Dr. Fredia Sorrow.   Significant Diagnostic Studies:  US Renal  04/01/2012  *RADIOLOGY REPORT*  Clinical Data: Flank pain  RENAL / URINARY TRACT ULTRASOUND  Technique:  Complete ultrasound exam of the kidneys and urinary bladder was performed.  Comparison: No comparison studies available.  Findings:  The right kidney measures 10.9 cm in long axis.  The left kidney measures 11.4 cm.  There is fullness of the right intrarenal collecting system.  Hyperechoic shadowing material is identified in the collecting system of the right kidney. This is most likely related to a large staghorn calculus.  As there appears to wrapped around the structure on some of the images, I do not think it represents gas within the collecting system.  Left kidney is sonographically normal in appearance.  Bladder is decompressed.  Intrauterine  gestational sac noted incidentally in the uterus.  Impression:  Echogenic material in the right intrarenal collecting system and appears to fill central calyces of the kidney.  This appearance is most likely related to a large staghorn calculus as urine can be seen encasing the echogenic material on some images. There is associated mild to moderate hydronephrosis on the right.  I discussed these findings by telephone with Raymon Mutton at 1930 hours on 04/01/2012.   Original Report Authenticated By: Kennith Center, M.D.     Brief H and P: For complete details please refer to admission H and P, but in brief patient is a 37 y.o. female with no significant past medical history presented the ER because of worsening right flank pain. Patient has been having flank pain since a week now which has been gradually worsening. Denies any associated nausea vomiting fever chills dysuria or diarrhea. In the ER renal sonogram revealed a right-sided renal stone with hydronephrosis. Patient's UA is compatible UTI. Patient is [redacted] weeks pregnant. On-call urologist Dr. Margarita Grizzle has been consulted. Patient's gynecologist on call was also consulted by the ED physician Dr. Hyacinth Meeker. Presently patient has been admitted for further workup given her complicated UTI with renal stone or hydronephrosis. Patient recently does not look septic. Patient otherwise denies any chest pain shortness of breath.     Hospital Course:  Principal Problem:   UTI (lower urinary tract infection) Active Problems:   Renal stone  Hydronephrosis   Pregnancy   Pyelonephritis/UTI - was started on Rocephin. -Abdominal ultrasound showed findings of a ureteral calculus and some mild hydronephrosis. -Urology was consulted by the emergency department who recommended conservative treatment, however the patient was also given the option to pursue a percutaneous nephrostomy which she was interested in. However, after conversation with the interventional  radiologist as well as with Dr. Senaida Ores, her OB GYN, we have decided that at this point the best outcome is to treat her with antibiotics and monitor her with serial ultrasounds to follow her hydronephrosis as placement of a percutaneous nephrostomy this early in pregnancy could lead to repeated infections as well as risk for the fetus with insertion. -Urine culture data is pending. I have discharged her on Macrobid 100 mg twice daily for 7 days. Results of urine culture should be followed up on at time of next appointment with her gynecologist.    Time spent on Discharge: Greater than 30 minutes.  SignedChaya Jan [facility] Pager: [phone] 04/02/2012, 3:48 PM

## 2012-04-03 ENCOUNTER — Other Ambulatory Visit (HOSPITAL_COMMUNITY): Payer: Self-pay | Admitting: Urology

## 2012-04-03 DIAGNOSIS — N200 Calculus of kidney: Secondary | ICD-10-CM

## 2012-04-03 LAB — URINE CULTURE

## 2012-04-08 LAB — CULTURE, BLOOD (ROUTINE X 2): Culture: NO GROWTH

## 2012-04-09 ENCOUNTER — Ambulatory Visit (HOSPITAL_COMMUNITY)
Admission: RE | Admit: 2012-04-09 | Discharge: 2012-04-09 | Disposition: A | Discharge status: Home / Self Care | Payer: Medicaid Other | Source: Ambulatory Visit | Attending: Urology | Admitting: Urology

## 2012-04-09 DIAGNOSIS — O99891 Other specified diseases and conditions complicating pregnancy: Secondary | ICD-10-CM | POA: Insufficient documentation

## 2012-04-09 DIAGNOSIS — Q619 Cystic kidney disease, unspecified: Secondary | ICD-10-CM | POA: Insufficient documentation

## 2012-04-09 DIAGNOSIS — N200 Calculus of kidney: Secondary | ICD-10-CM | POA: Insufficient documentation

## 2012-04-09 DIAGNOSIS — N1330 Unspecified hydronephrosis: Secondary | ICD-10-CM | POA: Insufficient documentation

## 2012-08-01 ENCOUNTER — Other Ambulatory Visit: Payer: Self-pay

## 2012-08-08 ENCOUNTER — Other Ambulatory Visit (HOSPITAL_COMMUNITY): Payer: Self-pay | Admitting: Obstetrics and Gynecology

## 2012-08-08 DIAGNOSIS — O43219 Placenta accreta, unspecified trimester: Secondary | ICD-10-CM

## 2012-08-21 ENCOUNTER — Other Ambulatory Visit (HOSPITAL_COMMUNITY): Payer: Self-pay | Admitting: Obstetrics and Gynecology

## 2012-08-21 ENCOUNTER — Ambulatory Visit (HOSPITAL_COMMUNITY)
Admission: RE | Admit: 2012-08-21 | Discharge: 2012-08-21 | Disposition: A | Discharge status: Home / Self Care | Payer: Medicaid Other | Source: Ambulatory Visit | Attending: Obstetrics and Gynecology | Admitting: Obstetrics and Gynecology

## 2012-08-21 ENCOUNTER — Encounter (HOSPITAL_COMMUNITY): Payer: Self-pay

## 2012-08-21 VITALS — BP 137/90 | HR 102 | Wt 177.0 lb

## 2012-08-21 DIAGNOSIS — O43899 Other placental disorders, unspecified trimester: Secondary | ICD-10-CM | POA: Insufficient documentation

## 2012-08-21 DIAGNOSIS — O43219 Placenta accreta, unspecified trimester: Secondary | ICD-10-CM

## 2012-08-21 DIAGNOSIS — Z3689 Encounter for other specified antenatal screening: Secondary | ICD-10-CM | POA: Insufficient documentation

## 2012-08-21 NOTE — Progress Notes (Signed)
[name]  was seen today for an ultrasound appointment.  See full report in [facility].  Comments: [name] was seen today due to possible placenta accreta.  Her OB history is remarkable for 4 prior C-sections.  She was initially followed due to suspected placenta previa.  On her most recent clinic ultrasound, there was some suspicion of placenta accreta along the left lower margin of the placenta and resolution of the placenta previa.  On examination today, an anterior placenta is noted.  The are no clear ultrasonographic findings consistent with invasive placentation; however, based on history of 4 prior C-sections alone she is at significant risk for placenta accreta.  I feel confident that there is no evidence of placenta increta or percreta, but the diagnosis of placenta accreta may ultimately not be made until the time of delivery.  Feel that the benefit of further imaging (MRI) is unlikely to be useful.  We had a brief discussion regarding the potential need for cesarean hysterectomy after delivery.  Impression: Single IUP at 30 6/7weeks Fetal growth is appropriate (70th %tile) Somewhat limited views of the fetal heart obtained.  No fetal anomalies identified. Anterior placenta No clear ultrasonographic findings consistent wtih invasive placentation transabdominal or transvaginal ultrasound.  Recommendations: Findings were discussed with Dr. Jackelyn Knife. Follow-up ultrasounds as clinically indicated.   Alpha Gula, MD

## 2012-09-22 ENCOUNTER — Inpatient Hospital Stay (HOSPITAL_COMMUNITY): Payer: Medicaid Other

## 2012-09-22 ENCOUNTER — Inpatient Hospital Stay (HOSPITAL_COMMUNITY)
Admission: AD | Admit: 2012-09-22 | Discharge: 2012-09-25 | DRG: 778 | Disposition: A | Discharge status: Home / Self Care | Payer: Medicaid Other | Source: Ambulatory Visit | Attending: Obstetrics and Gynecology | Admitting: Obstetrics and Gynecology

## 2012-09-22 ENCOUNTER — Encounter (HOSPITAL_COMMUNITY): Payer: Self-pay | Admitting: *Deleted

## 2012-09-22 DIAGNOSIS — O4693 Antepartum hemorrhage, unspecified, third trimester: Secondary | ICD-10-CM

## 2012-09-22 DIAGNOSIS — O4590 Premature separation of placenta, unspecified, unspecified trimester: Secondary | ICD-10-CM | POA: Diagnosis present

## 2012-09-22 DIAGNOSIS — O4700 False labor before 37 completed weeks of gestation, unspecified trimester: Principal | ICD-10-CM | POA: Diagnosis present

## 2012-09-22 DIAGNOSIS — O4690 Antepartum hemorrhage, unspecified, unspecified trimester: Secondary | ICD-10-CM

## 2012-09-22 LAB — URINALYSIS, ROUTINE W REFLEX MICROSCOPIC
Ketones, ur: NEGATIVE mg/dL
Nitrite: NEGATIVE
Protein, ur: NEGATIVE mg/dL
Urobilinogen, UA: 0.2 mg/dL (ref 0.0–1.0)
pH: 6.5 (ref 5.0–8.0)

## 2012-09-22 LAB — COMPREHENSIVE METABOLIC PANEL
BUN: 5 mg/dL — ABNORMAL LOW (ref 6–23)
CO2: 23 mEq/L (ref 19–32)
Calcium: 9.2 mg/dL (ref 8.4–10.5)
GFR calc Af Amer: >90 mL/min (ref >90)
GFR calc non Af Amer: >90 mL/min (ref >90)
Glucose, Bld: 107 mg/dL — ABNORMAL HIGH (ref 70–99)
Total Protein: 6.4 g/dL (ref 6.0–8.3)

## 2012-09-22 LAB — CBC
HCT: 30.4 % — ABNORMAL LOW (ref 36.0–46.0)
Hemoglobin: 10.1 g/dL — ABNORMAL LOW (ref 12.0–15.0)
MCH: 26 pg (ref 26.0–34.0)
MCHC: 33.2 g/dL (ref 30.0–36.0)
MCV: 78.1 fL (ref 78.0–100.0)

## 2012-09-22 MED ORDER — SODIUM CHLORIDE 0.9 % IJ SOLN
3.0000 mL | INTRAMUSCULAR | Status: DC | PRN
  Administered 2012-09-24: 3 mL via INTRAVENOUS

## 2012-09-22 MED ORDER — PRENATAL MULTIVITAMIN CH
1.0000 | ORAL_TABLET | Freq: Every day | ORAL | Status: DC
  Administered 2012-09-22 – 2012-09-25 (×5): 1 via ORAL
  Filled 2012-09-22 (×4): qty 1

## 2012-09-22 MED ORDER — ZOLPIDEM TARTRATE 5 MG PO TABS: 5.0000 mg | ORAL_TABLET | Freq: Every evening | ORAL | Status: DC | PRN

## 2012-09-22 MED ORDER — CALCIUM CARBONATE ANTACID 500 MG PO CHEW: 2.0000 | CHEWABLE_TABLET | ORAL | Status: DC | PRN

## 2012-09-22 MED ORDER — SODIUM CHLORIDE 0.9 % IJ SOLN
3.0000 mL | Freq: Two times a day (BID) | INTRAMUSCULAR | Status: DC
  Administered 2012-09-22 – 2012-09-24 (×5): 3 mL via INTRAVENOUS

## 2012-09-22 MED ORDER — TRAMADOL HCL 50 MG PO TABS: 50.0000 mg | ORAL_TABLET | Freq: Once | ORAL | Status: DC

## 2012-09-22 MED ORDER — SODIUM CHLORIDE 0.9 % IV SOLN: 250.0000 mL | INTRAVENOUS | Status: DC | PRN

## 2012-09-22 MED ORDER — DOCUSATE SODIUM 100 MG PO CAPS
100.0000 mg | ORAL_CAPSULE | Freq: Every day | ORAL | Status: DC
  Administered 2012-09-22 – 2012-09-25 (×4): 100 mg via ORAL
  Filled 2012-09-22 (×4): qty 1

## 2012-09-22 MED ORDER — ACETAMINOPHEN 325 MG PO TABS: 650.0000 mg | ORAL_TABLET | ORAL | Status: DC | PRN

## 2012-09-22 NOTE — H&P (Signed)
History    CSN: [id number]  Arrival date and time: 09/22/12 0000  First Provider Initiated Contact with Patient 09/22/12 0116  Chief Complaint   Patient presents with   .  Vaginal Bleeding    HPI  This is a 37 y.o. female at [redacted]w[redacted]d who presents with c/o vaginal bleeding since 11pm. Also feels tightening and pressure, but no pain. Good fetal movement. History is remarkable for prev C/S x 3, and possible acreta.  RN Note:  Pt reports she started having vaginal bleeding around 11pm and increase pelvic pressure. Good fetal movement reported. Pt also reports She might have an acreeda . MFM was unsure . Pt is to have repeat c- section ( 3 Previous )  Revision History...  Date/Time User Action  09/22/2012 12:43 AM Kathrine Delorise Shiner, RN Addend  09/22/2012 12:35 AM Kathrine Delorise Shiner, RN Sign  View Details Report  OB History    Grav  Para  Term  Preterm  Abortions  TAB  SAB  Ect  Mult  Living    5  3  3   0  1  0  0  1   3      Past Medical History   Diagnosis  Date   .  Hypertension    .  Ectopic pregnancy     Past Surgical History   Procedure  Laterality  Date   .  Cesarean section      Family History   Problem  Relation  Age of Onset   .  Diabetes type II  Mother    .  Hypertension  Mother    .  Diabetes type II  Sister     History   Substance Use Topics   .  Smoking status:  Light Tobacco Smoker -- 10 years   .  Smokeless tobacco:  Not on file      Comment: patient states she only smokes 1 cigarette a day   .  Alcohol Use:  No    Allergies: No Known Allergies  Prescriptions prior to admission   Medication  Sig  Dispense  Refill   .  acetaminophen (TYLENOL) 500 MG tablet  Take 500 mg by mouth every 6 (six) hours as needed for pain.     .  nitrofurantoin, macrocrystal-monohydrate, (MACROBID) 100 MG capsule  Take 1 capsule (100 mg total) by mouth 2 (two) times daily.  14 capsule  0   .  Prenatal Vit-Fe Fumarate-FA (PRENATAL MULTIVITAMIN) TABS  Take 1 tablet by  mouth daily at 12 noon.      Review of Systems  Constitutional: Negative for fever and malaise/fatigue.  Gastrointestinal: Negative for nausea, vomiting, abdominal pain, diarrhea and constipation.  Genitourinary: Negative for dysuria.  Vaginal bleeding   Physical Exam   Blood pressure 153/92, pulse 90, temperature 98.2 F (36.8 C), temperature source Oral, resp. rate 18, height 5' (1.524 m), weight 82.736 kg (182 lb 6.4 oz), last menstrual period 12/20/2011.  Physical Exam  Constitutional: She is oriented to person, place, and time. She appears well-developed and well-nourished. No distress.  HENT:  Head: Normocephalic.  Cardiovascular: Normal rate.  Respiratory: Effort normal.  GI: Soft. She exhibits no distension and no mass. There is no tenderness. There is no rebound and no guarding.  Genitourinary: Uterus normal. Vaginal discharge: moderate port wine colored blood, cervix closed.  Musculoskeletal: Normal range of motion.  Neurological: She is alert and oriented to person, place, and time.  Skin: Skin is warm and  dry.  Psychiatric: She has a normal mood and affect.   MAU Course   Procedures  MDM  Filled pad upon arrival. [place] ordered and pt filled another pad.  US shows no evidence of abruption  Assessment and Plan   A: SIUP at [redacted]w[redacted]d  Third Trimester bleeding  P: Admit per Dr Jackelyn Knife  Observation  Still having some bleeding when she is up walking around, having ctx q 10-15 minutes. I suspect a small abruption.  Will continue to monitor today since still bleeding, if bleeding stops and FHT remains reassuring would consider d/c home. Ultrasound with MFM a few weeks ago did not reveal any evidence of placenta accreta.

## 2012-09-22 NOTE — MAU Note (Addendum)
Pt reports she started having vaginal bleeding around 11pm and increase pelvic pressure. Good fetal movement reported. Pt also reports She might have an acreeda . MFM was unsure . Pt is to have repeat c- section ( 3 Previous )

## 2012-09-22 NOTE — MAU Provider Note (Signed)
History     CSN: [id number]  Arrival date and time: 09/22/12 0000   First Provider Initiated Contact with Patient 09/22/12 0116      Chief Complaint  Patient presents with  . Vaginal Bleeding   HPI This is a 37 y.o. female at [redacted]w[redacted]d who presents with c/o vaginal bleeding since 11pm.  Also feels tightening and pressure, but no pain. Good fetal movement. History is remarkable for prev C/S x 3, and possible acreta.   RN Note: Pt reports she started having vaginal bleeding around 11pm and increase pelvic pressure. Good fetal movement reported. Pt also reports She might have an acreeda . MFM was unsure . Pt is to have repeat c- section ( 3 Previous )  Revision History...     Date/Time User Action   09/22/2012 12:43 AM Kathrine Delorise Shiner, RN Addend   09/22/2012 12:35 AM Kathrine Delorise Shiner, RN Sign  View Details Report      OB History   Grav Para Term Preterm Abortions TAB SAB Ect Mult Living   5 3 3  0 1 0 0 1  3      Past Medical History  Diagnosis Date  . Hypertension   . Ectopic pregnancy     Past Surgical History  Procedure Laterality Date  . Cesarean section      Family History  Problem Relation Age of Onset  . Diabetes type II Mother   . Hypertension Mother   . Diabetes type II Sister     History  Substance Use Topics  . Smoking status: Light Tobacco Smoker -- 10 years  . Smokeless tobacco: Not on file     Comment: patient states she only smokes 1 cigarette a day  . Alcohol Use: No    Allergies: No Known Allergies  Prescriptions prior to admission  Medication Sig Dispense Refill  . acetaminophen (TYLENOL) 500 MG tablet Take 500 mg by mouth every 6 (six) hours as needed for pain.      . nitrofurantoin, macrocrystal-monohydrate, (MACROBID) 100 MG capsule Take 1 capsule (100 mg total) by mouth 2 (two) times daily.  14 capsule  0  . Prenatal Vit-Fe Fumarate-FA (PRENATAL MULTIVITAMIN) TABS Take 1 tablet by mouth daily at 12 noon.        Review  of Systems  Constitutional: Negative for fever and malaise/fatigue.  Gastrointestinal: Negative for nausea, vomiting, abdominal pain, diarrhea and constipation.  Genitourinary: Negative for dysuria.       Vaginal bleeding    Physical Exam   Blood pressure 153/92, pulse 90, temperature 98.2 F (36.8 C), temperature source Oral, resp. rate 18, height 5' (1.524 m), weight 82.736 kg (182 lb 6.4 oz), last menstrual period 12/20/2011.  Physical Exam  Constitutional: She is oriented to person, place, and time. She appears well-developed and well-nourished. No distress.  HENT:  Head: Normocephalic.  Cardiovascular: Normal rate.   Respiratory: Effort normal.  GI: Soft. She exhibits no distension and no mass. There is no tenderness. There is no rebound and no guarding.  Genitourinary: Uterus normal. Vaginal discharge: moderate port wine colored blood, cervix closed.  Musculoskeletal: Normal range of motion.  Neurological: She is alert and oriented to person, place, and time.  Skin: Skin is warm and dry.  Psychiatric: She has a normal mood and affect.    MAU Course  Procedures  MDM Filled pad upon arrival. [place] ordered and pt filled another pad.  US shows no evidence of abruption  Assessment and Plan  A:  SIUP at [redacted]w[redacted]d       Third Trimester bleeding  P:  Admit per Dr Jackelyn Knife      Observation Wynelle Bourgeois 09/22/2012, 3:14 AM

## 2012-09-23 ENCOUNTER — Ambulatory Visit (HOSPITAL_COMMUNITY): Payer: Medicaid Other

## 2012-09-23 NOTE — Consult Note (Signed)
Maternal Fetal Medicine Consultation  Requesting Provider(s): Lavina Hamman, MD  Reason for consultation: Vaginal bleeding  HPI: [name] is a 37 yo [id number] currently at 23 4/7 weeks - admitted on 20 September due to vaginal bleeding and frequent uterine contractions. Vaginal bleeding has improved - yesterday noted some browning discharge.  This morning, noted to have some red vagina spotting- not enough "to fill a pad".  She continues to have some irregular uterine contractions.  The patient's prenatal course has been complicated by a history of three previous C-sections.  She was initially noted to have placenta previa that resolved.  On previous ultrasounds, no findings of invasive placentation noted - but continues to be at increased risk for placenta accreta by history alone. [name] reports a history of post partum preeclampsia and hypertension in 2012 - required Labetalol x 3-4 months after delivery.  She has not required anti-hypertensive medications at any other time.  [name] is otherwise without complaints today.   OB History: OB History   Grav Para Term Preterm Abortions TAB SAB Ect Mult Living   5 3 3  0 1 0 0 1  3      PMH:  Past Medical History  Diagnosis Date  . Hypertension   . Ectopic pregnancy     PSH:  Past Surgical History  Procedure Laterality Date  . Cesarean section     Meds:  Scheduled Meds: . docusate sodium  100 mg Oral Daily  . prenatal multivitamin  1 tablet Oral Q1200  . sodium chloride  3 mL Intravenous Q12H   Continuous Infusions:  PRN Meds:.sodium chloride, acetaminophen, calcium carbonate, sodium chloride, zolpidem  Allergies: No Known Allergies  FH: denies family history of birth defects or hereditary disorders  Soc: denies tobacco or ETOH use  Review of Systems: no vaginal bleeding or cramping/contractions, no LOF, no nausea/vomiting. All other systems reviewed and are negative.  PE:   Filed Vitals:   09/23/12 0805   BP: 122/60  Pulse: 88  Temp: 98 F (36.7 C)  Resp: 24  BPs: some isolated increased Blood pressures - 153/92, 153/78, 140/67   Labs: CBC    Component Value Date/Time   WBC 12.9* 09/22/2012 0142   RBC 3.89 09/22/2012 0142   HGB 10.1* 09/22/2012 0142   HCT 30.4* 09/22/2012 0142   PLT 237 09/22/2012 0142   MCV 78.1 09/22/2012 0142   MCH 26.0 09/22/2012 0142   MCHC 33.2 09/22/2012 0142   RDW 14.1 09/22/2012 0142   LYMPHSABS 2.7 04/01/2012 1523   MONOABS 0.7 04/01/2012 1523   EOSABS 0.3 04/01/2012 1523   BASOSABS 0.0 04/01/2012 1523   CMP     Component Value Date/Time   NA 137 09/22/2012 0142   K 3.2* 09/22/2012 0142   CL 102 09/22/2012 0142   CO2 23 09/22/2012 0142   GLUCOSE 107* 09/22/2012 0142   BUN 5* 09/22/2012 0142   CREATININE 0.59 09/22/2012 0142   CALCIUM 9.2 09/22/2012 0142   PROT 6.4 09/22/2012 0142   ALBUMIN 2.8* 09/22/2012 0142   AST 13 09/22/2012 0142   ALT 11 09/22/2012 0142   ALKPHOS 139* 09/22/2012 0142   BILITOT 0.2* 09/22/2012 0142   GFRNONAA >90 09/22/2012 0142   GFRAA >90 09/22/2012 0142     A/P: 1) Single IUP at 35 4/7 weeks         2) Hx of C-section x 3  - anterior placenta without previa; no evidence of invasive placentation by ultrasound, but at increased  risk for accreta due to history         3) Labile blood pressures - normal preeclampsia labs.         4) Vaginal bleeding of unknown etiology- possible small abruption  Recommendations: Since patient had episode of vaginal bleeding this morning, recommend continued in-house observation.  Would continue in-house observation until no bleeding for 48-72 hours.  If the patient continues to have vaginal bleeding, would recommend repeat C-section at 37 weeks.  If patient is discharged, recommend 2x weekly NSTs with weekly AFIs and scheduled C-section at 39 weeks.  Continue to follow BPs closely.  Based on latest ACOG Task Force guidelines on Hypertension in Pregnancy, there is little distinction between preeclampsia with  "mild features" and gestational hypertension.  If the patient continues to be hypertensive, would consider delivery at 37 weeks.   Thank you for the opportunity to be a part of the care of [facility]. Please contact our office if we can be of further assistance.   I spent approximately 30 minutes with this patient with over 50% of time spent in face-to-face counseling.  Alpha Gula, MD Maternal Fetal Medicine

## 2012-09-23 NOTE — Progress Notes (Signed)
Pt states that pains in lower abd are sharp and only last for a few secs are her scar from prev c/s

## 2012-09-23 NOTE — Progress Notes (Signed)
HD #2 [redacted]W[redacted]D bleeding Feels ok, bleeding was brown yesterday, a little more red today, amount is the same-still running out a little Afeb, VSS FHT- Cat I, ctx q 10-20 min Possibly has small abruption, will continue observation for now, get MFM consult

## 2012-09-24 LAB — URINE CULTURE

## 2012-09-24 NOTE — Progress Notes (Signed)
HD #3 [redacted]W[redacted]D, bleeding Feels ok, occ ctx, no bleeding since yesterday am Afeb, VSS FHT- Cat I, irreg ctx Will continue observation today, could d/c home tomorrow am if no bleeding today per MFM recommendation.

## 2012-09-24 NOTE — Progress Notes (Signed)
Assumed care of this patient at 1510 from Bryn Gulling RN

## 2012-09-25 NOTE — Discharge Summary (Signed)
Physician Discharge Summary  Patient ID: [name] MRN: [id number] DOB/AGE: 1975/07/17 37 y.o.  Admit date: 09/22/2012 Discharge date: 09/25/2012  Admission Diagnoses:  Pregnancy at 35 6/7 weeks                                         Possible small abruption--vaginal bleeding in the third trimester    Discharge Diagnoses: same   Discharged Condition: good  Hospital Course: Pt was admitted with third trimester bleeding.  SHe was observed in-house until the bleeding stopped, and had an MFM consult.  Fetal monitoring was reassuring the entire admission.  When she had gone 48 hours with no bleeding, she was d/c to home with precautions for pelvic rest and limited activity.  Consults: MFM  Significant Diagnostic Studies: labs: fetal monitoring and CBC  Treatments: IV hydration  Discharge Exam: Blood pressure 134/67, pulse 74, temperature 98.2 F (36.8 C), temperature source Oral, resp. rate 18, height 5' (1.524 m), weight 82.736 kg (182 lb 6.4 oz), last menstrual period 12/20/2011. Abdomen gravid and NT No bleeidng  Disposition: 01-Home or Self Care      Discharge Orders   Future Orders Complete By Expires   Diet - low sodium heart healthy  As directed    Discharge instructions  As directed    Comments:     Call with any new vaginal bleeding or decreased fetal movement.  Come to office this Friday for monitoring (call to schedule).  Limit any strenuous activity and no sex.   Increase activity slowly  As directed        Medication List         prenatal multivitamin Tabs tablet  Take 1 tablet by mouth daily at 12 noon.       Follow-up Information   Follow up with Oliver Pila, MD. Schedule an appointment as soon as possible for a visit in 2 days. (Need non stress test friday 9/26 and appointment with Dr. Senaida Ores and non-stress test 9/29 )    Specialty:  Obstetrics and Gynecology   Contact information:   [address], [address] [place]  [address] [phone]       Signed: Oliver Pila 09/25/2012, 9:16 AM

## 2012-09-25 NOTE — Progress Notes (Signed)
Patient ID: [name], female   DOB: 01-30-1975, 37 y.o.   MRN: [id number] Pt doing well.  No bleeding in 48 hours and no pain.  Good FM EFM reassuring, will monitor this AM D/w pt d/c home on pelvic rest and limited activity.  She has help at home with the kids and feels she can do this. Will continue NST's in the office twice weekly as well as weekly AFI's.  D/c home after monitoring

## 2012-09-26 LAB — TYPE AND SCREEN
ABO/RH(D): B POS
Unit division: 0

## 2012-10-10 ENCOUNTER — Encounter (HOSPITAL_COMMUNITY): Payer: Self-pay | Admitting: Pharmacist

## 2012-10-16 ENCOUNTER — Encounter (HOSPITAL_COMMUNITY): Payer: Self-pay

## 2012-10-16 ENCOUNTER — Encounter (HOSPITAL_COMMUNITY)
Admission: RE | Admit: 2012-10-16 | Discharge: 2012-10-16 | Disposition: A | Discharge status: Home / Self Care | Payer: Medicaid Other | Source: Ambulatory Visit | Attending: Obstetrics and Gynecology | Admitting: Obstetrics and Gynecology

## 2012-10-16 DIAGNOSIS — Z01812 Encounter for preprocedural laboratory examination: Secondary | ICD-10-CM | POA: Insufficient documentation

## 2012-10-16 LAB — CBC
HCT: 31.7 % — ABNORMAL LOW (ref 36.0–46.0)
Hemoglobin: 10.7 g/dL — ABNORMAL LOW (ref 12.0–15.0)
MCH: 26.6 pg (ref 26.0–34.0)
MCV: 78.7 fL (ref 78.0–100.0)
Platelets: 210 10*3/uL (ref 150–400)
RBC: 4.03 MIL/uL (ref 3.87–5.11)
WBC: 11.4 10*3/uL — ABNORMAL HIGH (ref 4.0–10.5)

## 2012-10-16 LAB — COMPREHENSIVE METABOLIC PANEL
ALT: 9 U/L (ref 0–35)
AST: 13 U/L (ref 0–37)
Albumin: 2.7 g/dL — ABNORMAL LOW (ref 3.5–5.2)
Alkaline Phosphatase: 167 U/L — ABNORMAL HIGH (ref 39–117)
BUN: 8 mg/dL (ref 6–23)
Potassium: 3.6 mEq/L (ref 3.5–5.1)
Sodium: 136 mEq/L (ref 135–145)
Total Protein: 6.3 g/dL (ref 6.0–8.3)

## 2012-10-16 NOTE — Pre-Procedure Instructions (Signed)
Pt seen via Gloriajean Dell. Order given for two IV site access for 18g catheter. Short Stay RN [facility]) informed.

## 2012-10-16 NOTE — Patient Instructions (Signed)
20 [name]  10/16/2012   Your procedure is scheduled on:  10/18/12  Enter through the Main Entrance of [facility] at 1PM  Pick up the phone at the desk and dial [phone].   Call this number if you have problems the morning of surgery: [phone]   Remember:   Do not eat food:After Midnight.  Do not drink clear liquids: 4 Hours before arrival.  Take these medicines the morning of surgery with A SIP OF WATER: NA   Do not wear jewelry, make-up or nail polish.  Do not wear lotions, powders, or perfumes. You may wear deodorant.  Do not shave 48 hours prior to surgery.  Do not bring valuables to the hospital.  [facility] is not   responsible for any belongings or valuables brought to the hospital.  Contacts, dentures or bridgework may not be worn into surgery.  Leave suitcase in the car. After surgery it may be brought to your room.  For patients admitted to the hospital, checkout time is 11:00 AM the day of              discharge.   Patients discharged the day of surgery will not be allowed to drive             home.  Name and phone number of your driver: NA  Special Instructions:   Shower using CHG 2 nights before surgery and the night before surgery.  If you shower the day of surgery use CHG.  Use special wash - you have one bottle of CHG for all showers.  You should use approximately 1/3 of the bottle for each shower.   Please read over the following fact sheets that you were given:   Surgical Site Infection Prevention

## 2012-10-17 LAB — PREPARE RBC (CROSSMATCH)

## 2012-10-17 NOTE — H&P (Signed)
[name] is a 37 y.o. female [id number]  At 39+ weeks (EDD 10/24/12 by 7 week [place]) presenting for scheduled repeat c-section and bilateral distal salpingectomy for sterilization.  Prenatal care was complicated by a complicated UTI/nephrlithiasis in the first trimester requiring hospitalization.  She declined suppression antibiotics.  Around 35 weeks the patient was admitted with vaginal bleeding and presumed partial abruption, she stayed in-house about 3 days with the bleeding resolving, but continued to have brown spotting up until about a week prior to c-section.  Fetal NST's have been performed and WNL.  She has a prior c-section x 3 and had a placenta previa early on with part of the plane between placenta and uterus difficult to define, raising the possibility of accreta.  MFM was consulted and saw no definitive evidence of an accreta with the previa resolved--although they could not r/o a smalll accreta. The patient requests permanent sterilization and was counseled about bilateral distal salpingectomy.  Maternal Medical History:  Contractions: Frequency: rare.   Perceived severity is mild.    Fetal activity: Perceived fetal activity is normal.    Prenatal complications: Bleeding and placental abnormality.     OB History   Grav Para Term Preterm Abortions TAB SAB Ect Mult Living   5 3 3  0 1 0 0 1  3    1999 C/s 7#11OZ 2001 C/S 7#6OZ 2005 EAB 2005 Ectopic  Past Medical History  Diagnosis Date  . Ectopic pregnancy   . Hypertension     hx pre eclampsia with last preg.   Past Surgical History  Procedure Laterality Date  . Cesarean section    . Ectopic pregnancy surgery  2005   Family History: family history includes Diabetes type II in her mother and sister; Hypertension in her mother. Social History:  reports that she has quit smoking. She does not have any smokeless tobacco history on file. She reports that she does not drink alcohol or use illicit drugs.   Prenatal Transfer  Tool  Maternal Diabetes: No Genetic Screening: Normal Maternal Ultrasounds/Referrals: Abnormal:  Findings:   Other:Possible placenta accreta Fetal Ultrasounds or other Referrals:  Referred to Materal Fetal Medicine  Maternal Substance Abuse:  No Significant Maternal Medications:  None Significant Maternal Lab Results:  None Other Comments:  None  ROS    Last menstrual period 12/20/2011. Maternal Exam:  Uterine Assessment: Contraction strength is mild.  Contraction frequency is rare.   Abdomen: Patient reports no abdominal tenderness. Surgical scars: low transverse.   Fetal presentation: vertex  Introitus: Normal vulva. Normal vagina.    Physical Exam  Constitutional: She is oriented to person, place, and time. She appears well-developed and well-nourished.  Cardiovascular: Normal rate and regular rhythm.   Respiratory: Effort normal and breath sounds normal.  GI: Soft.  Genitourinary: Vagina normal and uterus normal.  Neurological: She is alert and oriented to person, place, and time.  Psychiatric: Her behavior is normal.    Prenatal labs: ABO, Rh: --/--/B POS (10/16 1310) Antibody: NEG (10/16 1310) Rubella: Immune (03/20 0000) RPR: Nonreactive (03/20 0000)  HBsAg: Negative (03/20 0000)  HIV: Non-reactive (03/20 0000)  GBS:   Negative One hour GTT 156 3 hour GTT 83/179/164/134 First trimester screen and AFP WNL  Assessment/Plan: d/w pt in detail her c-section and desire for permanent sterilization with bilateral salpingectomy. We discussed again risks of surgery and small possibility of accreta. She understands and is agreeable to a hysterectomy should that be encountered. Anesthesia (Dr. Malen Gauze) has been made aware, and  type and cross is ordered.      Oliver Pila 10/17/2012, 4:24 PM

## 2012-10-18 ENCOUNTER — Encounter (HOSPITAL_COMMUNITY): Payer: Medicaid Other | Admitting: Anesthesiology

## 2012-10-18 ENCOUNTER — Encounter (HOSPITAL_COMMUNITY): Payer: Self-pay | Admitting: *Deleted

## 2012-10-18 ENCOUNTER — Inpatient Hospital Stay (HOSPITAL_COMMUNITY)
Admission: RE | Admit: 2012-10-18 | Discharge: 2012-10-21 | DRG: 766 | Disposition: A | Discharge status: Home / Self Care | Payer: Medicaid Other | Source: Ambulatory Visit | Attending: Obstetrics and Gynecology | Admitting: Obstetrics and Gynecology

## 2012-10-18 ENCOUNTER — Encounter (HOSPITAL_COMMUNITY)
Admission: RE | Disposition: A | Discharge status: Home / Self Care | Payer: Self-pay | Source: Ambulatory Visit | Attending: Obstetrics and Gynecology

## 2012-10-18 ENCOUNTER — Inpatient Hospital Stay (HOSPITAL_COMMUNITY): Payer: Medicaid Other | Admitting: Anesthesiology

## 2012-10-18 DIAGNOSIS — Z302 Encounter for sterilization: Secondary | ICD-10-CM

## 2012-10-18 DIAGNOSIS — O34219 Maternal care for unspecified type scar from previous cesarean delivery: Principal | ICD-10-CM | POA: Diagnosis present

## 2012-10-18 DIAGNOSIS — Z9079 Acquired absence of other genital organ(s): Secondary | ICD-10-CM

## 2012-10-18 DIAGNOSIS — Z98891 History of uterine scar from previous surgery: Secondary | ICD-10-CM

## 2012-10-18 DIAGNOSIS — O09529 Supervision of elderly multigravida, unspecified trimester: Secondary | ICD-10-CM | POA: Diagnosis present

## 2012-10-18 LAB — GLUCOSE, CAPILLARY: Glucose-Capillary: 96 mg/dL (ref 70–99)

## 2012-10-18 SURGERY — Surgical Case
Anesthesia: Spinal | Site: Abdomen | Laterality: Right | Wound class: Clean Contaminated

## 2012-10-18 MED ORDER — DIPHENHYDRAMINE HCL 50 MG/ML IJ SOLN: 25.0000 mg | INTRAMUSCULAR | Status: DC | PRN

## 2012-10-18 MED ORDER — LACTATED RINGERS IV SOLN
INTRAVENOUS | Status: DC | PRN
  Administered 2012-10-18: 16:00:00 via INTRAVENOUS

## 2012-10-18 MED ORDER — SCOPOLAMINE 1 MG/3DAYS TD PT72
1.0000 | MEDICATED_PATCH | Freq: Once | TRANSDERMAL | Status: DC
  Administered 2012-10-18: 1.5 mg via TRANSDERMAL

## 2012-10-18 MED ORDER — DIPHENHYDRAMINE HCL 50 MG/ML IJ SOLN: 12.5000 mg | INTRAMUSCULAR | Status: DC | PRN

## 2012-10-18 MED ORDER — FENTANYL CITRATE 0.05 MG/ML IJ SOLN
INTRAMUSCULAR | Status: AC
  Filled 2012-10-18: qty 2

## 2012-10-18 MED ORDER — LANOLIN HYDROUS EX OINT: 1.0000 "application " | TOPICAL_OINTMENT | CUTANEOUS | Status: DC | PRN

## 2012-10-18 MED ORDER — DIBUCAINE 1 % RE OINT: 1.0000 "application " | TOPICAL_OINTMENT | RECTAL | Status: DC | PRN

## 2012-10-18 MED ORDER — NALBUPHINE HCL 10 MG/ML IJ SOLN
5.0000 mg | INTRAMUSCULAR | Status: DC | PRN
  Filled 2012-10-18: qty 1

## 2012-10-18 MED ORDER — FENTANYL CITRATE 0.05 MG/ML IJ SOLN
25.0000 ug | INTRAMUSCULAR | Status: DC | PRN
  Administered 2012-10-18 (×2): 50 ug via INTRAVENOUS

## 2012-10-18 MED ORDER — PRENATAL MULTIVITAMIN CH
1.0000 | ORAL_TABLET | Freq: Every day | ORAL | Status: DC
  Administered 2012-10-19 – 2012-10-20 (×2): 1 via ORAL
  Filled 2012-10-18 (×2): qty 1

## 2012-10-18 MED ORDER — KETOROLAC TROMETHAMINE 30 MG/ML IJ SOLN: 30.0000 mg | Freq: Four times a day (QID) | INTRAMUSCULAR | Status: AC | PRN

## 2012-10-18 MED ORDER — SCOPOLAMINE 1 MG/3DAYS TD PT72: 1.0000 | MEDICATED_PATCH | Freq: Once | TRANSDERMAL | Status: DC

## 2012-10-18 MED ORDER — PHENYLEPHRINE HCL 10 MG/ML IJ SOLN
INTRAMUSCULAR | Status: DC | PRN
  Administered 2012-10-18: 40 ug via INTRAVENOUS

## 2012-10-18 MED ORDER — LORATADINE 10 MG PO TABS
10.0000 mg | ORAL_TABLET | Freq: Every day | ORAL | Status: DC | PRN
  Filled 2012-10-18: qty 1

## 2012-10-18 MED ORDER — SODIUM BICARBONATE 8.4 % IV SOLN
INTRAVENOUS | Status: DC | PRN
  Administered 2012-10-18: 6 mL via EPIDURAL
  Administered 2012-10-18: 2 mL via EPIDURAL
  Administered 2012-10-18: 5 mL via EPIDURAL
  Administered 2012-10-18: 2 mL via EPIDURAL

## 2012-10-18 MED ORDER — OXYTOCIN 40 UNITS IN LACTATED RINGERS INFUSION - SIMPLE MED
INTRAVENOUS | Status: DC | PRN
  Administered 2012-10-18: 40 [IU] via INTRAVENOUS

## 2012-10-18 MED ORDER — ONDANSETRON HCL 4 MG/2ML IJ SOLN: 4.0000 mg | INTRAMUSCULAR | Status: DC | PRN

## 2012-10-18 MED ORDER — DIPHENHYDRAMINE HCL 25 MG PO CAPS: 25.0000 mg | ORAL_CAPSULE | Freq: Four times a day (QID) | ORAL | Status: DC | PRN

## 2012-10-18 MED ORDER — OXYTOCIN 40 UNITS IN LACTATED RINGERS INFUSION - SIMPLE MED: 62.5000 mL/h | INTRAVENOUS | Status: AC

## 2012-10-18 MED ORDER — NALOXONE HCL 0.4 MG/ML IJ SOLN: 0.4000 mg | INTRAMUSCULAR | Status: DC | PRN

## 2012-10-18 MED ORDER — METOCLOPRAMIDE HCL 5 MG/ML IJ SOLN
INTRAMUSCULAR | Status: DC | PRN
  Administered 2012-10-18: 10 mg via INTRAVENOUS

## 2012-10-18 MED ORDER — ONDANSETRON HCL 4 MG/2ML IJ SOLN
INTRAMUSCULAR | Status: DC | PRN
  Administered 2012-10-18: 4 mg via INTRAMUSCULAR

## 2012-10-18 MED ORDER — SODIUM CHLORIDE 0.9 % IJ SOLN: 3.0000 mL | INTRAMUSCULAR | Status: DC | PRN

## 2012-10-18 MED ORDER — FENTANYL CITRATE 0.05 MG/ML IJ SOLN
INTRAMUSCULAR | Status: DC | PRN
  Administered 2012-10-18: 100 ug via INTRAVENOUS
  Administered 2012-10-18: 100 ug via EPIDURAL

## 2012-10-18 MED ORDER — NALOXONE HCL 1 MG/ML IJ SOLN: 1.0000 ug/kg/h | INTRAVENOUS | Status: DC | PRN

## 2012-10-18 MED ORDER — MORPHINE SULFATE 0.5 MG/ML IJ SOLN
INTRAMUSCULAR | Status: AC
  Filled 2012-10-18: qty 10

## 2012-10-18 MED ORDER — SCOPOLAMINE 1 MG/3DAYS TD PT72
MEDICATED_PATCH | TRANSDERMAL | Status: AC
  Administered 2012-10-18: 1.5 mg via TRANSDERMAL
  Filled 2012-10-18: qty 1

## 2012-10-18 MED ORDER — CEFAZOLIN SODIUM-DEXTROSE 2-3 GM-% IV SOLR
INTRAVENOUS | Status: AC
  Administered 2012-10-18: 2 g via INTRAVENOUS
  Filled 2012-10-18: qty 50

## 2012-10-18 MED ORDER — ONDANSETRON HCL 4 MG/2ML IJ SOLN
INTRAMUSCULAR | Status: AC
  Filled 2012-10-18: qty 2

## 2012-10-18 MED ORDER — MEPERIDINE HCL 25 MG/ML IJ SOLN: 6.2500 mg | INTRAMUSCULAR | Status: DC | PRN

## 2012-10-18 MED ORDER — MENTHOL 3 MG MT LOZG: 1.0000 | LOZENGE | OROMUCOSAL | Status: DC | PRN

## 2012-10-18 MED ORDER — PHENYLEPHRINE 40 MCG/ML (10ML) SYRINGE FOR IV PUSH (FOR BLOOD PRESSURE SUPPORT)
PREFILLED_SYRINGE | INTRAVENOUS | Status: AC
  Filled 2012-10-18: qty 5

## 2012-10-18 MED ORDER — CEFAZOLIN SODIUM-DEXTROSE 2-3 GM-% IV SOLR: 2.0000 g | INTRAVENOUS | Status: DC

## 2012-10-18 MED ORDER — SIMETHICONE 80 MG PO CHEW: 80.0000 mg | CHEWABLE_TABLET | ORAL | Status: DC | PRN

## 2012-10-18 MED ORDER — PHENYLEPHRINE HCL 10 MG/ML IJ SOLN
20.0000 mg | INTRAVENOUS | Status: DC | PRN
  Administered 2012-10-18: 60 ug/min via INTRAVENOUS

## 2012-10-18 MED ORDER — LACTATED RINGERS IV SOLN
Freq: Once | INTRAVENOUS | Status: AC
  Administered 2012-10-18: 14:00:00 via INTRAVENOUS

## 2012-10-18 MED ORDER — KETOROLAC TROMETHAMINE 60 MG/2ML IM SOLN
60.0000 mg | Freq: Once | INTRAMUSCULAR | Status: AC | PRN
  Administered 2012-10-18: 60 mg via INTRAMUSCULAR

## 2012-10-18 MED ORDER — TETANUS-DIPHTH-ACELL PERTUSSIS 5-2.5-18.5 LF-MCG/0.5 IM SUSP: 0.5000 mL | Freq: Once | INTRAMUSCULAR | Status: DC

## 2012-10-18 MED ORDER — LACTATED RINGERS IV SOLN
INTRAVENOUS | Status: DC
  Administered 2012-10-18 (×3): via INTRAVENOUS

## 2012-10-18 MED ORDER — DIPHENHYDRAMINE HCL 25 MG PO CAPS
25.0000 mg | ORAL_CAPSULE | ORAL | Status: DC | PRN
  Filled 2012-10-18: qty 1

## 2012-10-18 MED ORDER — SENNOSIDES-DOCUSATE SODIUM 8.6-50 MG PO TABS
2.0000 | ORAL_TABLET | ORAL | Status: DC
  Administered 2012-10-19 – 2012-10-20 (×2): 2 via ORAL
  Filled 2012-10-18 (×2): qty 2

## 2012-10-18 MED ORDER — WITCH HAZEL-GLYCERIN EX PADS: 1.0000 "application " | MEDICATED_PAD | CUTANEOUS | Status: DC | PRN

## 2012-10-18 MED ORDER — FENTANYL CITRATE 0.05 MG/ML IJ SOLN
INTRAMUSCULAR | Status: AC
  Filled 2012-10-18: qty 5

## 2012-10-18 MED ORDER — CHLOROPROCAINE HCL 3 % IJ SOLN
INTRAMUSCULAR | Status: DC | PRN
  Administered 2012-10-18: 20 mL

## 2012-10-18 MED ORDER — LACTATED RINGERS IV SOLN
INTRAVENOUS | Status: DC
  Administered 2012-10-19: 01:00:00 via INTRAVENOUS

## 2012-10-18 MED ORDER — SIMETHICONE 80 MG PO CHEW
80.0000 mg | CHEWABLE_TABLET | ORAL | Status: DC
  Administered 2012-10-20: 80 mg via ORAL
  Filled 2012-10-18 (×2): qty 1

## 2012-10-18 MED ORDER — METOCLOPRAMIDE HCL 5 MG/ML IJ SOLN: 10.0000 mg | Freq: Three times a day (TID) | INTRAMUSCULAR | Status: DC | PRN

## 2012-10-18 MED ORDER — ONDANSETRON HCL 4 MG PO TABS: 4.0000 mg | ORAL_TABLET | ORAL | Status: DC | PRN

## 2012-10-18 MED ORDER — IBUPROFEN 600 MG PO TABS
600.0000 mg | ORAL_TABLET | Freq: Four times a day (QID) | ORAL | Status: DC
  Administered 2012-10-19 – 2012-10-21 (×10): 600 mg via ORAL
  Filled 2012-10-18 (×10): qty 1

## 2012-10-18 MED ORDER — MORPHINE SULFATE (PF) 0.5 MG/ML IJ SOLN
INTRAMUSCULAR | Status: DC | PRN
  Administered 2012-10-18: .15 mg via EPIDURAL

## 2012-10-18 MED ORDER — OXYCODONE-ACETAMINOPHEN 5-325 MG PO TABS
1.0000 | ORAL_TABLET | ORAL | Status: DC | PRN
  Administered 2012-10-19: 1 via ORAL
  Administered 2012-10-19: 2 via ORAL
  Administered 2012-10-19: 1 via ORAL
  Administered 2012-10-20 – 2012-10-21 (×4): 2 via ORAL
  Filled 2012-10-18 (×3): qty 2
  Filled 2012-10-18: qty 1
  Filled 2012-10-18 (×2): qty 2
  Filled 2012-10-18: qty 1

## 2012-10-18 MED ORDER — ONDANSETRON HCL 4 MG/2ML IJ SOLN: 4.0000 mg | Freq: Three times a day (TID) | INTRAMUSCULAR | Status: DC | PRN

## 2012-10-18 MED ORDER — ZOLPIDEM TARTRATE 5 MG PO TABS: 5.0000 mg | ORAL_TABLET | Freq: Every evening | ORAL | Status: DC | PRN

## 2012-10-18 MED ORDER — EPHEDRINE 5 MG/ML INJ
INTRAVENOUS | Status: AC
  Filled 2012-10-18: qty 10

## 2012-10-18 MED ORDER — LACTATED RINGERS IV SOLN: INTRAVENOUS | Status: DC

## 2012-10-18 MED ORDER — OXYTOCIN 10 UNIT/ML IJ SOLN
INTRAMUSCULAR | Status: AC
  Filled 2012-10-18: qty 4

## 2012-10-18 MED ORDER — KETOROLAC TROMETHAMINE 60 MG/2ML IM SOLN
INTRAMUSCULAR | Status: AC
  Filled 2012-10-18: qty 2

## 2012-10-18 MED ORDER — SIMETHICONE 80 MG PO CHEW
80.0000 mg | CHEWABLE_TABLET | Freq: Three times a day (TID) | ORAL | Status: DC
  Administered 2012-10-19 – 2012-10-20 (×7): 80 mg via ORAL
  Filled 2012-10-18 (×5): qty 1

## 2012-10-18 MED ORDER — SODIUM BICARBONATE 8.4 % IV SOLN
INTRAVENOUS | Status: DC | PRN
  Administered 2012-10-18: 20 mL via EPIDURAL

## 2012-10-18 SURGICAL SUPPLY — 30 items
BENZOIN TINCTURE PRP APPL 2/3 (GAUZE/BANDAGES/DRESSINGS) ×2 IMPLANT
CLOTH BEACON ORANGE TIMEOUT ST (SAFETY) ×2 IMPLANT
CONTAINER PREFILL 10% NBF 15ML (MISCELLANEOUS) ×2 IMPLANT
DRAPE LG THREE QUARTER DISP (DRAPES) ×4 IMPLANT
DRSG OPSITE POSTOP 4X10 (GAUZE/BANDAGES/DRESSINGS) ×2 IMPLANT
DURAPREP 26ML APPLICATOR (WOUND CARE) ×2 IMPLANT
ELECT REM PT RETURN 9FT ADLT (ELECTROSURGICAL) ×2
ELECTRODE REM PT RTRN 9FT ADLT (ELECTROSURGICAL) ×1 IMPLANT
EXTRACTOR VACUUM KIWI (MISCELLANEOUS) ×2 IMPLANT
GLOVE BIO SURGEON STRL SZ 6.5 (GLOVE) ×2 IMPLANT
GOWN PREVENTION PLUS XLARGE (GOWN DISPOSABLE) ×4 IMPLANT
NS IRRIG 1000ML POUR BTL (IV SOLUTION) ×2 IMPLANT
PACK C SECTION WH (CUSTOM PROCEDURE TRAY) ×2 IMPLANT
PAD OB MATERNITY 4.3X12.25 (PERSONAL CARE ITEMS) ×2 IMPLANT
RTRCTR C-SECT PINK 25CM LRG (MISCELLANEOUS) ×2 IMPLANT
STRIP CLOSURE SKIN 1/2X4 (GAUZE/BANDAGES/DRESSINGS) ×2 IMPLANT
SUT CHROMIC 1 CTX 36 (SUTURE) ×6 IMPLANT
SUT PLAIN 0 NONE (SUTURE) ×2 IMPLANT
SUT PLAIN 2 0 (SUTURE) ×1
SUT PLAIN ABS 2-0 CT1 27XMFL (SUTURE) ×1 IMPLANT
SUT VIC AB 0 CT1 27 (SUTURE) ×4
SUT VIC AB 0 CT1 27XBRD ANBCTR (SUTURE) ×4 IMPLANT
SUT VIC AB 2-0 CT1 27 (SUTURE) ×2
SUT VIC AB 2-0 CT1 TAPERPNT 27 (SUTURE) ×2 IMPLANT
SUT VIC AB 3-0 CT1 27 (SUTURE) ×1
SUT VIC AB 3-0 CT1 TAPERPNT 27 (SUTURE) ×1 IMPLANT
SUT VIC AB 4-0 KS 27 (SUTURE) ×2 IMPLANT
TOWEL OR 17X24 6PK STRL BLUE (TOWEL DISPOSABLE) ×2 IMPLANT
TRAY FOLEY CATH 14FR (SET/KITS/TRAYS/PACK) ×2 IMPLANT
WATER STERILE IRR 1000ML POUR (IV SOLUTION) ×2 IMPLANT

## 2012-10-18 NOTE — Brief Op Note (Signed)
10/18/2012  4:51 PM  PATIENT:  [name]  37 y.o. female  PRE-OPERATIVE DIAGNOSIS:  Previous Cesarean Section; Desires Sterilization  POST-OPERATIVE DIAGNOSIS:  Previous Cesarean Section; Desires Sterilization  PROCEDURE:  Procedure(s): REPEAT CESAREAN SECTION WITH BILATERAL TUBAL LIGATION (Right)  SURGEON:  Surgeon(s) and Role:    * Oliver Pila, MD - Primary    * Sherron Monday, MD - Assisting  PHYSICIAN ASSISTANT:     ANESTHESIA:   epidural and spinal  EBL:  Total I/O In: 3600 [I.V.:3600] Out: 300 [Urine:300]  BLOOD ADMINISTERED:none  DRAINS: Urinary Catheter (Foley)   LOCAL MEDICATIONS USED:  BUPIVICAINE   SPECIMEN: placenta to L&D  DISPOSITION OF SPECIMEN:  L&D  COUNTS:  YES  TOURNIQUET:  * No tourniquets in log *  DICTATION: .Dragon Dictation  PLAN OF CARE: Admit to inpatient   PATIENT DISPOSITION:  PACU - hemodynamically stable.

## 2012-10-18 NOTE — Op Note (Signed)
Operative report  Preoperative diagnosis Term pregnancy at [redacted] weeks gestation Prior C-section x3 with known adhesions Possibility of placenta accreta Prior left salpingectomy for ectopic pregnancy Desires sterilization  Postoperative diagnosis Same with no placenta accreta Dense pelvic adhesions  Procedure Repeat transverse cesarean section Distal right salpingectomy  Surgeon Dr. Huel Cote Dr. Sherron Monday  Anesthesia Spinal/epidural  Findings There is a viable female infant in the vertex presentation. Apgars were 8 and 9. Weight pending at time of dictation.  Upon entering the abdomen there were dense adhesions from the anterior surface of the uterus to the abdominal wall. The rectus muscles appeared distorted and adherent as well. The uterus had significant vasculature in the lower uterine segment, and the placenta was anterior just under a very thin layer of tissue in the lower uterine segment. However the placenta did easily separate after delivery of the infant. The right ovary and tube appeared normal and the left fallopian tube was surgically absent.  Fluids Estimated blood loss 1000 cc Urine output 350 cc clear IV fluid 2500 cc LR  Specimen Placenta was sent to L&D  Procedure note Patient was taken to the operating room after informed consent was obtained. There was blood available in the room should we encounter of placenta accreta and the patient had 2 large bore IV sites as well. A spinal epidural was placed by Dr. Cristela Blue without difficulty. The patient was prepped and draped in the normal sterile fashion in the dorsal supine position with a leftward tilt. An appropriate time out was performed. A Pfannenstiel skin incision was then made through pre-existing scar and carried through to underlying layer of fascia by sharp dissection and Bovie cautery. The fascia was then nicked in the midline and was extended laterally with Mayo scissors. The inferior aspect was  grasped with Coker clamps elevated and dissected off the rectus muscles.  Similarly the superior aspect was dissected off the underlying rectus muscles. Rectus muscles were somewhat distorted from her prior surgery and adherent in the midline however they were able to be separated in the peritoneum identified. The entire lower uterine segment was somewhat adherent to the anterior abdominal wall and the peritoneal cavity was identified fundally. The dense adhesions between the uterus and anterior wall were then taken down with Bovie cautery. Once the uterus was relatively free from the abdominal wall the Alexis retractor was placed within the incision. At this point the lower uterine segment of the uterus looked extremely vascular and it was unclear whether there was a placenta increta.  About one third up the fundus there was a clear area and a transverse incision was made here to affect delivery of the infant. The cavity itself was entered bluntly and the placenta is encountered. This was elevated anteriorly and the amniotic fluid sac ruptured with clear fluid noted. The infant's head was then elevated to the level of the incision and could not quite be delivered with fundal pressure so the Kiwi vacuum was placed on the vertex and it was delivered with gentle traction. The remainder of the infant delivered and the nose and mouth were bulb suctioned with the infant handed to the waiting pediatricians after the cord was clamped and cut. Attention was immediately returned to the uterus where the placenta was seen to be adequately separating from the uterine surface. It was delivered and handed off. Apparently the vasculature of the lower uterine segment was actually the placenta visible through a very thin layer of lower uterine segment. Once  the placenta was delivered the uterine incision edges could be isolated and grasped with ring forceps. The fundus was cleared of all clots and debris with moist lap sponge. The  uterine incision was then closed in 2 layers the first a running locked layer 1-0 chromic and the second an imbricating layer of the same suture. At the conclusion of the closure the uterine incision was hemostatic. Attention was turned to the right adnexa where the fallopian tube was identified and traced out to its fimbriated end. It was elevated with a Babcock clamp and the distal one third of the fallopian tube was transected and handed off to pathology. The pedicle was then secured with a suture ligature of 2-0 Vicryl and a free tie of 2-0 plain suture. All appeared hemostatic. The gutters were cleared of all clots and debris. The left adnexa was examined and confirmed that there did not appear to be a left fallopian tube which was consistent with her surgical history of a left ectopic pregnancy with salpingectomy. All areas were inspected and found to be hemostatic.  The abdominal wall closure was then accomplished by suturing the rectus muscle on the patient's left which was partially transected with taking down adhesions. The rectus muscles and peritoneum were then closed with 2-0 Vicryl in several interrupted mattress sutures. The muscles were ready quite distorted from her prior surgeries but could be sutured at the midline. The fascia was then closed with 0 Vicryl in a running fashion. The subcutaneous tissue was then reapproximated with 30 plain in a running fashion the skin was closed in a subcuticular stitch of 4-0 Vicryl on a Keith needle and reinforced with benzoin and Steri-Strips. Pressure was held on a small area of oozing at the left corner of the incision and appeared to resolve. A 4 x 4 was placed over this area under the honeycomb dressing to observe for any further oozing. The patient was then taken to the recovery room in good condition with her baby accompanying her.

## 2012-10-18 NOTE — Anesthesia Postprocedure Evaluation (Signed)
  Anesthesia Post-op Note  Patient: [name]  Procedure(s) Performed: Procedure(s): REPEAT CESAREAN SECTION WITH BILATERAL TUBAL LIGATION (Right)  Patient is awake, responsive, moving her legs, and has signs of resolution of her numbness. Pain and nausea are reasonably well controlled. Vital signs are stable and clinically acceptable. Oxygen saturation is clinically acceptable. There are no apparent anesthetic complications at this time. Patient is ready for discharge.

## 2012-10-18 NOTE — Transfer of Care (Signed)
Immediate Anesthesia Transfer of Care Note  Patient: [name]  Procedure(s) Performed: Procedure(s): REPEAT CESAREAN SECTION WITH BILATERAL TUBAL LIGATION (Right)  Patient Location: PACU  Anesthesia Type:Spinal and Epidural  Level of Consciousness: awake, alert  and oriented  Airway & Oxygen Therapy: Patient Spontanous Breathing  Post-op Assessment: Report given to PACU RN and Post -op Vital signs reviewed and stable  Post vital signs: Reviewed and stable  Complications: No apparent anesthesia complications

## 2012-10-18 NOTE — Anesthesia Preprocedure Evaluation (Addendum)
Anesthesia Evaluation  Patient identified by MRN, date of birth, ID band Patient awake    Reviewed: Allergy & Precautions, H&P , NPO status , Patient's Chart, lab work & pertinent test results, reviewed documented beta blocker date and time   History of Anesthesia Complications Negative for: history of anesthetic complications  Airway Mallampati: III TM Distance: >3 FB Neck ROM: full    Dental  (+) Teeth Intact   Pulmonary neg pulmonary ROS,  breath sounds clear to auscultation        Cardiovascular negative cardio ROS  Rhythm:regular Rate:Normal     Neuro/Psych negative neurological ROS  negative psych ROS   GI/Hepatic negative GI ROS, Neg liver ROS,   Endo/Other  negative endocrine ROSBMI 36.2 - obese  Renal/GU Renal disease (h/o kidney stones)  negative genitourinary   Musculoskeletal   Abdominal   Peds  Hematology  (+) anemia ,   Anesthesia Other Findings   Reproductive/Obstetrics (+) Pregnancy (h/o c/s x3, placenta previa which has resolved, "small possibility" of accreta - for repeat c/s and BTL)                         Anesthesia Physical Anesthesia Plan  ASA: III  Anesthesia Plan: Combined Spinal and Epidural   Post-op Pain Management:    Induction:   Airway Management Planned:   Additional Equipment:   Intra-op Plan:   Post-operative Plan:   Informed Consent: I have reviewed the patients History and Physical, chart, labs and discussed the procedure including the risks, benefits and alternatives for the proposed anesthesia with the patient or authorized representative who has indicated his/her understanding and acceptance.     Plan Discussed with: Surgeon and CRNA  Anesthesia Plan Comments:        Anesthesia Quick Evaluation

## 2012-10-18 NOTE — Progress Notes (Signed)
Patient ID: [name], female   DOB: 07-30-1975, 37 y.o.   MRN: [id number] Per pt no changes in dictated H&P Brief exam WNL She was re-counseled on c-section and possible hysterectomy if accreta encountered.  Desires sterilization and will perform.

## 2012-10-18 NOTE — Anesthesia Procedure Notes (Signed)
Epidural Patient location during procedure: OB  Preanesthetic Checklist Completed: patient identified, site marked, surgical consent, pre-op evaluation, timeout performed, IV checked, risks and benefits discussed and monitors and equipment checked  Epidural Patient position: sitting Prep: site prepped and draped and DuraPrep Patient monitoring: continuous pulse ox and blood pressure Approach: midline Injection technique: LOR air  Needle:  Needle type: Tuohy  Needle gauge: 17 G Needle length: 9 cm and 9 Needle insertion depth: 7 cm Catheter type: closed end flexible Catheter size: 19 Gauge Catheter at skin depth: 14 cm Test dose: negative  Assessment Events: blood not aspirated, injection not painful, no injection resistance, negative IV test and no paresthesia  Additional Notes (-) asp heme/CSF thru catheter Spinal Dosage in OR  Sprotte thru Tuohy.Marland KitchenMarland KitchenNo paresthesia Bupivicaine ml       1.3 PFMS04   mcg        150

## 2012-10-19 LAB — CBC
HCT: 24.2 % — ABNORMAL LOW (ref 36.0–46.0)
Hemoglobin: 8.3 g/dL — ABNORMAL LOW (ref 12.0–15.0)
MCH: 26.8 pg (ref 26.0–34.0)
MCHC: 34.3 g/dL (ref 30.0–36.0)
RDW: 14.9 % (ref 11.5–15.5)

## 2012-10-19 NOTE — Progress Notes (Signed)
Subjective: Postpartum Day #1: Cesarean Delivery Patient reports incisional pain, tolerating PO and no problems voiding.    Objective: Vital signs in last 24 hours: Temp:  [97.1 F (36.2 C)-98.4 F (36.9 C)] 97.6 F (36.4 C) (10/18 0345) Pulse Rate:  [73-109] 78 (10/18 0345) Resp:  [16-21] 20 (10/18 0345) BP: (120-157)/(63-97) 120/77 mmHg (10/18 0345) SpO2:  [96 %-100 %] 97 % (10/18 0345) Weight:  [83.915 kg (185 lb)] 83.915 kg (185 lb) (10/17 1715)  Physical Exam:  General: alert Lochia: appropriate Uterine Fundus: firm Incision: dressing C/D/I   Recent Labs  10/16/12 0945 10/19/12 0605  HGB 10.7* 8.3*  HCT 31.7* 24.2*    Assessment/Plan: Status post Cesarean section. Doing well postoperatively.  Continue current care, ambulate.  Lundyn Coste D 10/19/2012, 8:59 AM

## 2012-10-19 NOTE — Anesthesia Postprocedure Evaluation (Signed)
  Anesthesia Post-op Note  Patient: Delories Heinz  Procedure(s) Performed: Procedure(s): REPEAT CESAREAN SECTION WITH BILATERAL TUBAL LIGATION (Right)  Patient Location: PACU and Mother/Baby  Anesthesia Type:Spinal  Level of Consciousness: awake, alert  and oriented  Airway and Oxygen Therapy: Patient Spontanous Breathing  Post-op Pain: mild  Post-op Assessment: Patient's Cardiovascular Status Stable, Respiratory Function Stable, No signs of Nausea or vomiting and Pain level controlled  Post-op Vital Signs: stable  Complications: No apparent anesthesia complications Anesthesia Post-op Note  Patient: Shamanda Dinkel  Procedure(s) Performed: Procedure(s): REPEAT CESAREAN SECTION WITH BILATERAL TUBAL LIGATION (Right)  Patient Location: PACU and Mother/Baby  Anesthesia Type:Spinal  Level of Consciousness: awake, alert  and oriented  Airway and Oxygen Therapy: Patient Spontanous Breathing  Post-op Pain: mild  Post-op Assessment: Patient's Cardiovascular Status Stable, Respiratory Function Stable, No signs of Nausea or vomiting and Pain level controlled  Post-op Vital Signs: stable  Complications: No apparent anesthesia complications

## 2012-10-20 MED ORDER — BISACODYL 10 MG RE SUPP
10.0000 mg | Freq: Every day | RECTAL | Status: DC | PRN
  Administered 2012-10-20: 10 mg via RECTAL
  Filled 2012-10-20: qty 1

## 2012-10-20 NOTE — Discharge Summary (Signed)
Obstetric Discharge Summary Reason for Admission: cesarean section Prenatal Procedures: NST and ultrasound Intrapartum Procedures: cesarean: low cervical, transverse and and right distal salpingectomy Postpartum Procedures: none Complications-Operative and Postpartum: none Hemoglobin  Date Value Range Status  10/19/2012 8.3* 12.0 - 15.0 g/dL Final     HCT  Date Value Range Status  10/19/2012 24.2* 36.0 - 46.0 % Final    Physical Exam:  General: alert and cooperative Lochia: appropriate Uterine Fundus: firm Incision: C/D/I   Discharge Diagnoses: Term Pregnancy-delivered                                         S/p c-section x 4                                         S/p right distal salpingectomy for sterilization (prior left salpingectomy)  Discharge Information: Date: 10/21/2012 Activity: pelvic rest Diet: routine Medications: Ibuprofen and Percocet and dulcolax suppositories Condition: improved Instructions: refer to practice specific booklet Discharge to: home Follow-up Information   Follow up with Oliver Pila, MD. Schedule an appointment as soon as possible for a visit in 2 weeks. (Incision check/BP check)    Specialty:  Obstetrics and Gynecology   Contact information:   [address], [address] [place] [address] [phone]       Newborn Data: Live born female  Birth Weight: 7 lb 13.8 oz (3565 g) APGAR: 8, 9  Home with mother.  Oliver Pila 10/21/2012, 9:29 AM

## 2012-10-20 NOTE — Progress Notes (Signed)
POD #2 Doing well, feels gassy, no n/v Afeb, VSS Abd- soft, slightly distended, fundus firm, dressing intact Continue routine care

## 2012-10-21 ENCOUNTER — Encounter (HOSPITAL_COMMUNITY): Payer: Self-pay | Admitting: Obstetrics and Gynecology

## 2012-10-21 LAB — TYPE AND SCREEN
ABO/RH(D): B POS
Antibody Screen: NEGATIVE
Unit division: 0
Unit division: 0

## 2012-10-21 MED ORDER — BISACODYL 10 MG RE SUPP: 10.0000 mg | Freq: Every day | RECTAL | Status: DC | PRN

## 2012-10-21 MED ORDER — IBUPROFEN 600 MG PO TABS: 600.0000 mg | ORAL_TABLET | Freq: Four times a day (QID) | ORAL | Status: DC

## 2012-10-21 MED ORDER — OXYCODONE-ACETAMINOPHEN 5-325 MG PO TABS: 1.0000 | ORAL_TABLET | ORAL | Status: DC | PRN

## 2012-10-21 NOTE — Progress Notes (Signed)
Subjective: Postpartum Day 3 Cesarean Delivery Patient reports tolerating PO and no problems voiding.  Had small BM last pm with dulcolax, not passing a lot of gas yet  Objective: Vital signs in last 24 hours: Temp:  [97.7 F (36.5 C)-98 F (36.7 C)] 98 F (36.7 C) (10/20 0600) Pulse Rate:  [81-99] 81 (10/20 0600) Resp:  [18] 18 (10/20 0600) BP: (138-148)/(72-84) 138/84 mmHg (10/20 0600)  Physical Exam:  General: alert and cooperative Lochia: appropriate Uterine Fundus: firm slightly distended, NT Incision: C/D/I    Recent Labs  10/19/12 0605  HGB 8.3*  HCT 24.2*    Assessment/Plan: Status post Cesarean section. Doing well postoperatively.  Discharge home with standard precautions and return to clinic in 1-2 weeks for incision check.  She is having return of bowel function, may repeat dulcolax today.  Oliver Pila 10/21/2012, 9:25 AM

## 2012-10-21 NOTE — Progress Notes (Signed)
Ur chart review completed.  

## 2012-10-22 ENCOUNTER — Other Ambulatory Visit (HOSPITAL_COMMUNITY): Payer: Medicaid Other

## 2013-01-30 ENCOUNTER — Other Ambulatory Visit (HOSPITAL_COMMUNITY): Payer: Self-pay | Admitting: Urology

## 2013-01-30 ENCOUNTER — Other Ambulatory Visit: Payer: Self-pay | Admitting: Urology

## 2013-01-30 DIAGNOSIS — N200 Calculus of kidney: Secondary | ICD-10-CM

## 2013-02-21 ENCOUNTER — Encounter (HOSPITAL_COMMUNITY): Payer: Self-pay | Admitting: Pharmacy Technician

## 2013-02-25 ENCOUNTER — Other Ambulatory Visit (HOSPITAL_COMMUNITY): Payer: Self-pay | Admitting: *Deleted

## 2013-02-25 NOTE — Patient Instructions (Addendum)
[name]  02/25/2013                           YOUR PROCEDURE IS SCHEDULED ON:  03/03/13               PLEASE REPORT TO RADIOLOGY AT : 6:30 AM               CALL THIS NUMBER IF ANY PROBLEMS THE DAY OF SURGERY :               [phone]                                REMEMBER:   Do not eat food or drink liquids AFTER MIDNIGHT                 Take these medicines the morning of surgery with A SIP OF WATER:   Do not wear jewelry, make-up   Do not wear lotions, powders, or perfumes.   Do not shave legs or underarms 12 hrs. before surgery (men may shave face)  Do not bring valuables to the hospital.  Contacts, dentures or bridgework may not be worn into surgery.  Leave suitcase in the car. After surgery it may be brought to your room.  For patients admitted to the hospital more than one night, checkout time is 11:00 AM                                                       The day of discharge.   Patients discharged the day of surgery will not be allowed to drive home.                If going home same day of surgery, must have someone stay with you            FIRST 24 hrs at home and arrange for some one to drive you home from hospital.    Special Instructions             Please read over the following fact sheets that you were given:               1. [facility] PREPARING FOR SURGERY SHEET                                                X_____________________________________________________________________        Failure to follow these instructions may result in cancellation of your surgery

## 2013-02-26 ENCOUNTER — Encounter (HOSPITAL_COMMUNITY)
Admission: RE | Admit: 2013-02-26 | Discharge: 2013-02-26 | Disposition: A | Discharge status: Home / Self Care | Payer: Medicaid Other | Source: Ambulatory Visit | Attending: Urology | Admitting: Urology

## 2013-02-26 ENCOUNTER — Encounter (HOSPITAL_COMMUNITY): Payer: Self-pay

## 2013-02-26 DIAGNOSIS — Z01812 Encounter for preprocedural laboratory examination: Secondary | ICD-10-CM | POA: Insufficient documentation

## 2013-02-26 HISTORY — DX: Dermatitis, unspecified: L30.9

## 2013-02-26 LAB — CBC
HEMATOCRIT: 36.1 % (ref 36.0–46.0)
HEMOGLOBIN: 11.9 g/dL — AB (ref 12.0–15.0)
MCH: 24.7 pg — ABNORMAL LOW (ref 26.0–34.0)
MCHC: 33 g/dL (ref 30.0–36.0)
MCV: 74.9 fL — ABNORMAL LOW (ref 78.0–100.0)
Platelets: 409 10*3/uL — ABNORMAL HIGH (ref 150–400)
RBC: 4.82 MIL/uL (ref 3.87–5.11)
RDW: 15.6 % — ABNORMAL HIGH (ref 11.5–15.5)
WBC: 11.9 10*3/uL — AB (ref 4.0–10.5)

## 2013-02-26 LAB — BASIC METABOLIC PANEL
BUN: 13 mg/dL (ref 6–23)
CHLORIDE: 104 meq/L (ref 96–112)
CO2: 19 mEq/L (ref 19–32)
Calcium: 9.1 mg/dL (ref 8.4–10.5)
Creatinine, Ser: 0.68 mg/dL (ref 0.50–1.10)
Glucose, Bld: 118 mg/dL — ABNORMAL HIGH (ref 70–99)
POTASSIUM: 3.9 meq/L (ref 3.7–5.3)
Sodium: 137 mEq/L (ref 137–147)

## 2013-02-26 LAB — HCG, SERUM, QUALITATIVE: PREG SERUM: NEGATIVE

## 2013-02-26 LAB — ABO/RH: ABO/RH(D): B POS

## 2013-02-27 ENCOUNTER — Other Ambulatory Visit: Payer: Self-pay | Admitting: Radiology

## 2013-03-01 NOTE — H&P (Signed)
Urology History and Physical Exam  CC: Right partial staghorn nephrolithiasis.  HPI:  38 year old female presents today for right nephrolithiasis.  She was first diagnosed with nephrolithiasis while she was pregnant.  This was discovered on ultrasound.  She did not require instrumentation.  Following her pregnancy she followed up for evaluation with CT to determine the size and location of the stone.  CT in January 2015 revealed a right partial staghorn stone.  This measured 3.1 x 5.37 m in size.  This is associated with pyuria.  She does not have a urinary tract infection.  Culture from 02/21/48 was negative for growth.  This is not associated gross hematuria.  Nothing makes it better or worse.  We have discussed treatment options along with risks, benefits, alternatives, and likelihood of achieving goals.  She presents today for right percutaneous nephrostolithotomy.  Due to the size of the stones she is elected to proceed with this as a staged procedure.  She scheduled for surgery today as well as surgery again in 2 days on 03/05/13.  Dr. Fredia SorrowYamagata with interventional radiology attempted to place right-sided percutaneous access today, but due to the size of the stone there was no room in the collecting system to allow for safe access passage of a guidewire down to the bladder. Because of this, it is not safe to dilate the kidney tract to this could result in bleeding that I could not control. I discussed with the patient and with Dr. Fredia SorrowYamagata and alternative plan for today. We did perform retrograde ureteroscopy and lithotripsy with holmium laser. This would allow me to create space in the calyces and renal pelvis to allow future percutaneous access. We did then obtained this access during this hospitalization and proceed as planned with percutaneous nephrostolithotomy on Wednesday. I discussed with the patient the risk and benefits which are very similar to PCNL except that there is actually decreased risk  of blood loss. There is risk of stricture to the ureter as we have previously discussed. I will do my best not to leave a stent, but that may be necessary. After discussing this the patient wished to proceed with ureteroscopy.     PMH: Past Medical History  Diagnosis Date  . Ectopic pregnancy   . Hypertension     hx pre eclampsia with last preg.  Marland Kitchen. Shortness of breath     occasionally with exertion   . Tendonitis     rt hand  . Eczema   . Kidney stone     PSH: Past Surgical History  Procedure Laterality Date  . Ectopic pregnancy surgery  2005  . Cesarean section with bilateral tubal ligation Right 10/18/2012    Procedure: REPEAT CESAREAN SECTION WITH BILATERAL TUBAL LIGATION;  Surgeon: Oliver PilaKathy W Richardson, MD;  Location: [facility];  Service: Obstetrics;  Laterality: Right; TOTAL OF 4 C SECTIONS    Allergies: No Known Allergies  Medications: No prescriptions prior to admission     Social History: History   Social History  . Marital Status: Single    Spouse Name: N/A    Number of Children: N/A  . Years of Education: N/A   Occupational History  . Not on file.   Social History Main Topics  . Smoking status: Light Tobacco Smoker -- 10 years  . Smokeless tobacco: Not on file     Comment: patient states she only smokes 1 cigarette a day  . Alcohol Use: Yes     Comment: occasional  . Drug Use: No  .  Sexual Activity: Not on file   Other Topics Concern  . Not on file   Social History Narrative  . No narrative on file    Family History: Family History  Problem Relation Age of Onset  . Diabetes type II Mother   . Hypertension Mother   . Diabetes type II Sister     Review of Systems: Positive: Urinary urgency. Negative: Fever, SOB, or chest pain.  A further 10 point review of systems was negative except what is listed in the HPI.  Physical Exam: Filed Vitals:   03/03/13 0713  BP: 136/82  Pulse: 71  Temp: 97.9 F (36.6 C)  Resp: 16    General: No  acute distress.  Awake and oriented x3. Head:  Normocephalic.  Atraumatic. ENT:  EOMI.  Mucous membranes moist Neck:  Supple.  No lymphadenopathy. CV:  S1 present. S2 present. Regular rate. Pulmonary: Equal effort bilaterally.  Clear to auscultation bilaterally. Abdomen: Soft.  Non- tender to palpation. Skin:  Normal turgor.  No visible rash. Extremity: No gross deformity of bilateral upper extremities.  No gross deformity of    bilateral lower extremities. Neurologic: Alert. Appropriate mood.    Studies:  Recent Labs     02/26/13  1150  HGB  11.9*  WBC  11.9*  PLT  409*    Recent Labs     02/26/13  1150  NA  137  K  3.9  CL  104  CO2  19  BUN  13  CREATININE  0.68  CALCIUM  9.1  GFRNONAA  >90  GFRAA  >90     No results found for this basename: PT, INR, APTT,  in the last 72 hours   No components found with this basename: ABG,     Assessment:  Right partial staghorn nephrolithiasis.  Plan: Procedure the operating room for cystoscopy, right ureteroscopy, laser lithotripsy, possible right retrograde PolyGram, possible right ureter stent placement.  She will be admitted after the procedure as planned with plans to proceed with a repeat percutaneous access attempted by interventional radiology tomorrow versus Wednesday morning.  We still plan to keep the patient on the schedule for right percutaneous nephrostolithotomy on Wednesday.

## 2013-03-02 MED ORDER — SODIUM CHLORIDE 0.9 % IV SOLN
2.0000 g | INTRAVENOUS | Status: AC
  Administered 2013-03-03: 2 g via INTRAVENOUS
  Filled 2013-03-02: qty 2000

## 2013-03-02 MED ORDER — SODIUM CHLORIDE 0.9 % IV SOLN
2.0000 g | INTRAVENOUS | Status: DC
  Filled 2013-03-02: qty 2000

## 2013-03-02 MED ORDER — GENTAMICIN SULFATE 40 MG/ML IJ SOLN
290.0000 mg | INTRAVENOUS | Status: AC
  Administered 2013-03-03: 290 mg via INTRAVENOUS
  Filled 2013-03-02: qty 7.25

## 2013-03-02 MED ORDER — GENTAMICIN SULFATE 40 MG/ML IJ SOLN
1.5000 mg/kg | INTRAVENOUS | Status: DC
  Filled 2013-03-02: qty 3.25

## 2013-03-03 ENCOUNTER — Encounter (HOSPITAL_COMMUNITY): Payer: Self-pay | Admitting: *Deleted

## 2013-03-03 ENCOUNTER — Inpatient Hospital Stay (HOSPITAL_COMMUNITY): Payer: Medicaid Other

## 2013-03-03 ENCOUNTER — Inpatient Hospital Stay (HOSPITAL_COMMUNITY): Admission: RE | Admit: 2013-03-03 | Payer: Medicaid Other | Source: Ambulatory Visit

## 2013-03-03 ENCOUNTER — Inpatient Hospital Stay (HOSPITAL_COMMUNITY)
Admission: RE | Admit: 2013-03-03 | Discharge: 2013-03-09 | DRG: 670 | Disposition: A | Discharge status: Home / Self Care | Payer: Medicaid Other | Source: Ambulatory Visit | Attending: Urology | Admitting: Urology

## 2013-03-03 ENCOUNTER — Ambulatory Visit (HOSPITAL_COMMUNITY)
Admission: RE | Admit: 2013-03-03 | Discharge: 2013-03-03 | Disposition: A | Discharge status: Home / Self Care | Payer: Medicaid Other | Source: Ambulatory Visit | Attending: Urology | Admitting: Urology

## 2013-03-03 ENCOUNTER — Encounter (HOSPITAL_COMMUNITY): Payer: Medicaid Other | Admitting: Anesthesiology

## 2013-03-03 ENCOUNTER — Inpatient Hospital Stay (HOSPITAL_COMMUNITY): Payer: Medicaid Other | Admitting: Anesthesiology

## 2013-03-03 ENCOUNTER — Encounter (HOSPITAL_COMMUNITY)
Admission: RE | Disposition: A | Discharge status: Home / Self Care | Payer: Self-pay | Source: Ambulatory Visit | Attending: Urology

## 2013-03-03 VITALS — BP 144/82 | HR 124 | Temp 101.3°F | Resp 16

## 2013-03-03 DIAGNOSIS — Z8249 Family history of ischemic heart disease and other diseases of the circulatory system: Secondary | ICD-10-CM

## 2013-03-03 DIAGNOSIS — Z87442 Personal history of urinary calculi: Secondary | ICD-10-CM

## 2013-03-03 DIAGNOSIS — R3915 Urgency of urination: Secondary | ICD-10-CM | POA: Diagnosis not present

## 2013-03-03 DIAGNOSIS — Z01812 Encounter for preprocedural laboratory examination: Secondary | ICD-10-CM

## 2013-03-03 DIAGNOSIS — R109 Unspecified abdominal pain: Secondary | ICD-10-CM | POA: Diagnosis not present

## 2013-03-03 DIAGNOSIS — F17200 Nicotine dependence, unspecified, uncomplicated: Secondary | ICD-10-CM | POA: Diagnosis present

## 2013-03-03 DIAGNOSIS — N200 Calculus of kidney: Principal | ICD-10-CM | POA: Diagnosis present

## 2013-03-03 DIAGNOSIS — R000 Tachycardia, unspecified: Secondary | ICD-10-CM | POA: Diagnosis present

## 2013-03-03 DIAGNOSIS — K5900 Constipation, unspecified: Secondary | ICD-10-CM | POA: Diagnosis not present

## 2013-03-03 DIAGNOSIS — R5082 Postprocedural fever: Secondary | ICD-10-CM | POA: Diagnosis not present

## 2013-03-03 DIAGNOSIS — Z833 Family history of diabetes mellitus: Secondary | ICD-10-CM

## 2013-03-03 HISTORY — PX: HOLMIUM LASER APPLICATION: SHX5852

## 2013-03-03 HISTORY — PX: NEPHROLITHOTOMY: SHX5134

## 2013-03-03 LAB — APTT: APTT: 29 s (ref 24–37)

## 2013-03-03 LAB — PROTIME-INR
INR: 1.03 (ref 0.00–1.49)
Prothrombin Time: 13.3 seconds (ref 11.6–15.2)

## 2013-03-03 LAB — BASIC METABOLIC PANEL
BUN: 14 mg/dL (ref 6–23)
CALCIUM: 9.3 mg/dL (ref 8.4–10.5)
CHLORIDE: 104 meq/L (ref 96–112)
CO2: 22 mEq/L (ref 19–32)
CREATININE: 0.69 mg/dL (ref 0.50–1.10)
GFR calc Af Amer: >90 mL/min (ref >90)
GFR calc non Af Amer: >90 mL/min (ref >90)
Glucose, Bld: 111 mg/dL — ABNORMAL HIGH (ref 70–99)
Potassium: 3.6 mEq/L — ABNORMAL LOW (ref 3.7–5.3)
Sodium: 140 mEq/L (ref 137–147)

## 2013-03-03 LAB — CBC
HEMATOCRIT: 34.7 % — AB (ref 36.0–46.0)
Hemoglobin: 11.5 g/dL — ABNORMAL LOW (ref 12.0–15.0)
MCH: 24.8 pg — ABNORMAL LOW (ref 26.0–34.0)
MCHC: 33.1 g/dL (ref 30.0–36.0)
MCV: 74.9 fL — ABNORMAL LOW (ref 78.0–100.0)
PLATELETS: 416 10*3/uL — AB (ref 150–400)
RBC: 4.63 MIL/uL (ref 3.87–5.11)
RDW: 15.5 % (ref 11.5–15.5)
WBC: 9.7 10*3/uL (ref 4.0–10.5)

## 2013-03-03 LAB — TYPE AND SCREEN
ABO/RH(D): B POS
ANTIBODY SCREEN: NEGATIVE

## 2013-03-03 SURGERY — NEPHROLITHOTOMY PERCUTANEOUS
Anesthesia: General | Laterality: Right

## 2013-03-03 MED ORDER — IOHEXOL 300 MG/ML  SOLN
20.0000 mL | Freq: Once | INTRAMUSCULAR | Status: AC | PRN
  Administered 2013-03-03: 1 mL

## 2013-03-03 MED ORDER — CIPROFLOXACIN IN D5W 400 MG/200ML IV SOLN
INTRAVENOUS | Status: AC
  Administered 2013-03-03: 400 mg via INTRAVENOUS
  Filled 2013-03-03: qty 200

## 2013-03-03 MED ORDER — BELLADONNA ALKALOIDS-OPIUM 16.2-60 MG RE SUPP
1.0000 | Freq: Four times a day (QID) | RECTAL | Status: DC | PRN
  Administered 2013-03-05 (×2): 1 via RECTAL
  Filled 2013-03-03 (×3): qty 1

## 2013-03-03 MED ORDER — ESMOLOL HCL 10 MG/ML IV SOLN
INTRAVENOUS | Status: DC | PRN
  Administered 2013-03-03 (×2): 20 mg via INTRAVENOUS

## 2013-03-03 MED ORDER — MIDAZOLAM HCL 2 MG/2ML IJ SOLN
INTRAMUSCULAR | Status: DC
Start: 2013-03-03 — End: 2013-03-04
  Filled 2013-03-03: qty 6

## 2013-03-03 MED ORDER — LIDOCAINE HCL (CARDIAC) 20 MG/ML IV SOLN
INTRAVENOUS | Status: AC
  Filled 2013-03-03: qty 5

## 2013-03-03 MED ORDER — DEXAMETHASONE SODIUM PHOSPHATE 10 MG/ML IJ SOLN
INTRAMUSCULAR | Status: AC
  Filled 2013-03-03: qty 1

## 2013-03-03 MED ORDER — CIPROFLOXACIN IN D5W 400 MG/200ML IV SOLN: 400.0000 mg | INTRAVENOUS | Status: DC

## 2013-03-03 MED ORDER — FENTANYL CITRATE 0.05 MG/ML IJ SOLN
INTRAMUSCULAR | Status: DC | PRN
  Administered 2013-03-03 (×4): 50 ug via INTRAVENOUS
  Administered 2013-03-03: 25 ug via INTRAVENOUS
  Administered 2013-03-03 (×4): 50 ug via INTRAVENOUS
  Administered 2013-03-03: 25 ug via INTRAVENOUS
  Administered 2013-03-03: 50 ug via INTRAVENOUS

## 2013-03-03 MED ORDER — ONDANSETRON HCL 4 MG/2ML IJ SOLN
4.0000 mg | INTRAMUSCULAR | Status: DC | PRN
  Administered 2013-03-04 – 2013-03-06 (×2): 4 mg via INTRAVENOUS
  Filled 2013-03-03 (×3): qty 2

## 2013-03-03 MED ORDER — BELLADONNA ALKALOIDS-OPIUM 16.2-60 MG RE SUPP
RECTAL | Status: AC
  Filled 2013-03-03: qty 1

## 2013-03-03 MED ORDER — FENTANYL CITRATE 0.05 MG/ML IJ SOLN
INTRAMUSCULAR | Status: AC
  Filled 2013-03-03: qty 5

## 2013-03-03 MED ORDER — ESMOLOL HCL 10 MG/ML IV SOLN
INTRAVENOUS | Status: AC
  Filled 2013-03-03: qty 10

## 2013-03-03 MED ORDER — PROMETHAZINE HCL 25 MG/ML IJ SOLN: 6.2500 mg | INTRAMUSCULAR | Status: DC | PRN

## 2013-03-03 MED ORDER — SODIUM CHLORIDE 0.9 % IR SOLN
Status: DC | PRN
  Administered 2013-03-03: 7000 mL

## 2013-03-03 MED ORDER — PROPOFOL 10 MG/ML IV BOLUS
INTRAVENOUS | Status: AC
  Filled 2013-03-03: qty 20

## 2013-03-03 MED ORDER — LACTATED RINGERS IV SOLN
INTRAVENOUS | Status: DC
  Administered 2013-03-03: 10 mL/h via INTRAVENOUS

## 2013-03-03 MED ORDER — LIDOCAINE HCL 1 % IJ SOLN
INTRAMUSCULAR | Status: AC
  Filled 2013-03-03: qty 20

## 2013-03-03 MED ORDER — FENTANYL CITRATE 0.05 MG/ML IJ SOLN
INTRAMUSCULAR | Status: DC
Start: 2013-03-03 — End: 2013-03-04
  Filled 2013-03-03: qty 4

## 2013-03-03 MED ORDER — LACTATED RINGERS IV SOLN
INTRAVENOUS | Status: DC | PRN
  Administered 2013-03-03 (×2): via INTRAVENOUS

## 2013-03-03 MED ORDER — MORPHINE SULFATE 2 MG/ML IJ SOLN
2.0000 mg | INTRAMUSCULAR | Status: DC | PRN
  Administered 2013-03-03 – 2013-03-04 (×5): 2 mg via INTRAVENOUS
  Administered 2013-03-04 – 2013-03-05 (×3): 4 mg via INTRAVENOUS
  Administered 2013-03-05: 2 mg via INTRAVENOUS
  Administered 2013-03-05: 4 mg via INTRAVENOUS
  Filled 2013-03-03 (×2): qty 1
  Filled 2013-03-03: qty 2
  Filled 2013-03-03: qty 1
  Filled 2013-03-03 (×2): qty 2
  Filled 2013-03-03 (×4): qty 1
  Filled 2013-03-03: qty 2
  Filled 2013-03-03: qty 1

## 2013-03-03 MED ORDER — LIDOCAINE HCL 2 % EX GEL
CUTANEOUS | Status: AC
  Filled 2013-03-03: qty 10

## 2013-03-03 MED ORDER — MIDAZOLAM HCL 5 MG/5ML IJ SOLN
INTRAMUSCULAR | Status: DC | PRN
  Administered 2013-03-03: 0.5 mg via INTRAVENOUS

## 2013-03-03 MED ORDER — MIDAZOLAM HCL 2 MG/2ML IJ SOLN
INTRAMUSCULAR | Status: AC | PRN
  Administered 2013-03-03: 1 mg via INTRAVENOUS
  Administered 2013-03-03: 2 mg via INTRAVENOUS
  Administered 2013-03-03 (×3): 1 mg via INTRAVENOUS
  Administered 2013-03-03: 2 mg via INTRAVENOUS

## 2013-03-03 MED ORDER — SODIUM CHLORIDE 0.9 % IV SOLN
INTRAVENOUS | Status: DC
  Administered 2013-03-03 – 2013-03-09 (×14): via INTRAVENOUS

## 2013-03-03 MED ORDER — HYDROMORPHONE HCL PF 1 MG/ML IJ SOLN
INTRAMUSCULAR | Status: AC
  Filled 2013-03-03: qty 1

## 2013-03-03 MED ORDER — FENTANYL CITRATE 0.05 MG/ML IJ SOLN
INTRAMUSCULAR | Status: AC | PRN
  Administered 2013-03-03 (×3): 100 ug via INTRAVENOUS
  Administered 2013-03-03 (×2): 50 ug via INTRAVENOUS

## 2013-03-03 MED ORDER — HYOSCYAMINE SULFATE 0.125 MG SL SUBL
0.1250 mg | SUBLINGUAL_TABLET | SUBLINGUAL | Status: DC | PRN
  Administered 2013-03-06 – 2013-03-08 (×4): 0.125 mg via SUBLINGUAL
  Filled 2013-03-03 (×4): qty 1

## 2013-03-03 MED ORDER — LACTATED RINGERS IV SOLN: INTRAVENOUS | Status: DC

## 2013-03-03 MED ORDER — OXYBUTYNIN CHLORIDE 5 MG PO TABS
5.0000 mg | ORAL_TABLET | Freq: Four times a day (QID) | ORAL | Status: DC | PRN
  Filled 2013-03-03: qty 1

## 2013-03-03 MED ORDER — LIDOCAINE HCL (CARDIAC) 20 MG/ML IV SOLN
INTRAVENOUS | Status: DC | PRN
  Administered 2013-03-03: 30 mg via INTRAVENOUS

## 2013-03-03 MED ORDER — OXYCODONE HCL 5 MG PO TABS
5.0000 mg | ORAL_TABLET | ORAL | Status: DC | PRN
  Administered 2013-03-03 – 2013-03-05 (×3): 5 mg via ORAL
  Administered 2013-03-05: 10 mg via ORAL
  Administered 2013-03-06: 5 mg via ORAL
  Filled 2013-03-03: qty 1
  Filled 2013-03-03: qty 2
  Filled 2013-03-03 (×4): qty 1
  Filled 2013-03-03: qty 2

## 2013-03-03 MED ORDER — PROPOFOL 10 MG/ML IV BOLUS
INTRAVENOUS | Status: DC | PRN
  Administered 2013-03-03: 150 mg via INTRAVENOUS

## 2013-03-03 MED ORDER — MIDAZOLAM HCL 2 MG/2ML IJ SOLN
INTRAMUSCULAR | Status: AC
  Filled 2013-03-03: qty 2

## 2013-03-03 MED ORDER — CISATRACURIUM BESYLATE 20 MG/10ML IV SOLN
INTRAVENOUS | Status: AC
  Filled 2013-03-03: qty 10

## 2013-03-03 MED ORDER — ONDANSETRON HCL 4 MG/2ML IJ SOLN
INTRAMUSCULAR | Status: DC | PRN
  Administered 2013-03-03 (×2): 2 mg via INTRAVENOUS

## 2013-03-03 MED ORDER — ONDANSETRON HCL 4 MG/2ML IJ SOLN
INTRAMUSCULAR | Status: AC
  Filled 2013-03-03: qty 2

## 2013-03-03 MED ORDER — BELLADONNA ALKALOIDS-OPIUM 16.2-60 MG RE SUPP
RECTAL | Status: DC | PRN
  Administered 2013-03-03: 1 via RECTAL

## 2013-03-03 MED ORDER — LIDOCAINE HCL 2 % EX GEL
CUTANEOUS | Status: DC | PRN
  Administered 2013-03-03: 1

## 2013-03-03 MED ORDER — SENNOSIDES-DOCUSATE SODIUM 8.6-50 MG PO TABS
1.0000 | ORAL_TABLET | Freq: Two times a day (BID) | ORAL | Status: DC
  Administered 2013-03-03 – 2013-03-09 (×12): 1 via ORAL
  Filled 2013-03-03 (×15): qty 1

## 2013-03-03 MED ORDER — IOHEXOL 300 MG/ML  SOLN
INTRAMUSCULAR | Status: DC | PRN
  Administered 2013-03-03: 19 mL

## 2013-03-03 MED ORDER — MIDAZOLAM HCL 2 MG/2ML IJ SOLN
INTRAMUSCULAR | Status: DC
Start: 2013-03-03 — End: 2013-03-04
  Filled 2013-03-03: qty 4

## 2013-03-03 MED ORDER — HYDROMORPHONE HCL PF 1 MG/ML IJ SOLN
0.2500 mg | INTRAMUSCULAR | Status: DC | PRN
  Administered 2013-03-03 (×2): 0.5 mg via INTRAVENOUS

## 2013-03-03 MED ORDER — FENTANYL CITRATE 0.05 MG/ML IJ SOLN
INTRAMUSCULAR | Status: AC
  Filled 2013-03-03: qty 6

## 2013-03-03 SURGICAL SUPPLY — 51 items
BAG URINE DRAINAGE (UROLOGICAL SUPPLIES) IMPLANT
BAG URO CATCHER STRL LF (DRAPE) ×3 IMPLANT
BASKET STONE NITINOL 3FRX115MB (UROLOGICAL SUPPLIES) IMPLANT
BENZOIN TINCTURE PRP APPL 2/3 (GAUZE/BANDAGES/DRESSINGS) IMPLANT
BNDG GAUZE ELAST 4 BULKY (GAUZE/BANDAGES/DRESSINGS) IMPLANT
CATH FOLEY 2W COUNCIL 20FR 5CC (CATHETERS) IMPLANT
CATH FOLEY 2WAY SLVR  5CC 18FR (CATHETERS)
CATH FOLEY 2WAY SLVR 5CC 18FR (CATHETERS) IMPLANT
CATH ROBINSON RED A/P 20FR (CATHETERS) IMPLANT
CATH URET 5FR 28IN OPEN ENDED (CATHETERS) IMPLANT
CATH URET DUAL LUMEN 6-10FR 50 (CATHETERS) IMPLANT
CATH X-FORCE N30 NEPHROSTOMY (TUBING) IMPLANT
CHLORAPREP W/TINT 26ML (MISCELLANEOUS) IMPLANT
COVER SURGICAL LIGHT HANDLE (MISCELLANEOUS) IMPLANT
DRAPE C-ARM 42X120 X-RAY (DRAPES) IMPLANT
DRAPE CAMERA CLOSED 9X96 (DRAPES) ×3 IMPLANT
DRAPE LINGEMAN PERC (DRAPES) IMPLANT
DRAPE SURG IRRIG POUCH 19X23 (DRAPES) IMPLANT
DRAPE UTILITY XL STRL (DRAPES) IMPLANT
FIBER LASER FLEXIVA 200 (UROLOGICAL SUPPLIES) ×3 IMPLANT
FIBER LASER FLEXIVA 550 (UROLOGICAL SUPPLIES) IMPLANT
GLOVE BIOGEL M 7.0 STRL (GLOVE) IMPLANT
GLOVE BIOGEL PI IND STRL 7.5 (GLOVE) IMPLANT
GLOVE BIOGEL PI INDICATOR 7.5 (GLOVE)
GLOVE ECLIPSE 7.0 STRL STRAW (GLOVE) ×3 IMPLANT
GOWN STRL REUS W/TWL XL LVL3 (GOWN DISPOSABLE) ×3 IMPLANT
GUIDEWIRE STR DUAL SENSOR (WIRE) ×3 IMPLANT
KIT BASIN OR (CUSTOM PROCEDURE TRAY) IMPLANT
LASER FIBER DISP 1000U (UROLOGICAL SUPPLIES) IMPLANT
MANIFOLD NEPTUNE II (INSTRUMENTS) ×3 IMPLANT
PACK BASIC VI WITH GOWN DISP (CUSTOM PROCEDURE TRAY) IMPLANT
PACK CYSTO (CUSTOM PROCEDURE TRAY) ×3 IMPLANT
PROBE LITHOCLAST ULTRA 3.8X403 (UROLOGICAL SUPPLIES) IMPLANT
PROBE PNEUMATIC 1.0MMX570MM (UROLOGICAL SUPPLIES) ×3 IMPLANT
SCRUB PCMX 4 OZ (MISCELLANEOUS) ×3 IMPLANT
SET IRRIG Y TYPE TUR BLADDER L (SET/KITS/TRAYS/PACK) IMPLANT
SET WARMING FLUID IRRIGATION (MISCELLANEOUS) IMPLANT
SHEATH ACCESS URETERAL 38CM (SHEATH) ×3 IMPLANT
SPONGE LAP 4X18 X RAY DECT (DISPOSABLE) IMPLANT
STENT CONTOUR 6FRX24X.038 (STENTS) ×3 IMPLANT
STENT ENDOURETEROTOMY 7-14 26C (STENTS) IMPLANT
STONE CATCHER W/TUBE ADAPTER (UROLOGICAL SUPPLIES) IMPLANT
SUT SILK 2 0 30  PSL (SUTURE)
SUT SILK 2 0 30 PSL (SUTURE) IMPLANT
SYR 20CC LL (SYRINGE) IMPLANT
SYRINGE 10CC LL (SYRINGE) IMPLANT
SYRINGE IRR TOOMEY STRL 70CC (SYRINGE) IMPLANT
TOWEL OR NON WOVEN STRL DISP B (DISPOSABLE) ×3 IMPLANT
TRAY FOLEY CATH 14FRSI W/METER (CATHETERS) IMPLANT
TUBING CONNECTING 10 (TUBING) ×2 IMPLANT
TUBING CONNECTING 10' (TUBING) ×1

## 2013-03-03 NOTE — Anesthesia Postprocedure Evaluation (Signed)
  Anesthesia Post-op Note  Patient: [name]  Procedure(s) Performed: Procedure(s) (LRB): Cystoscopy, Right Ureteroscopy with holmium laser (Right) HOLMIUM LASER APPLICATION (Right)  Patient Location: PACU  Anesthesia Type: General  Level of Consciousness: awake and alert   Airway and Oxygen Therapy: Patient Spontanous Breathing  Post-op Pain: mild  Post-op Assessment: Post-op Vital signs reviewed, Patient's Cardiovascular Status Stable, Respiratory Function Stable, Patent Airway and No signs of Nausea or vomiting  Last Vitals:  Filed Vitals:   03/03/13 1504  BP: 140/79  Pulse: 122  Temp: 37.7 C  Resp: 15    Post-op Vital Signs: stable   Complications: No apparent anesthesia complications

## 2013-03-03 NOTE — H&P (Signed)
Agree.  Large staghorn.  Will attempt perc access with ureteral catheter placement prior to OR case.

## 2013-03-03 NOTE — Procedures (Signed)
Procedure:  Right nephrostogram/renal perc access Findings:  Large stone burden in right renal collecting system.  Upper, lower and MP access performed.  Difficulty passing guidewires due to stone material. Via LP access, was successful in negotiating access into the proximal ureter.  However, due to tortuous course of catheter, could not advance stable access into distal ureter or bladder. Plan:  D/W Dr. Margarita GrizzleWoodruff.  Plan will be for OR ureteroscopy today with stone extraction at level of central collecting system.  Will then reattempt perc ureteral access Tue or Wed after patient admitted.

## 2013-03-03 NOTE — Preoperative (Signed)
Beta Blockers   Reason not to administer Beta Blockers:Not Applicable 

## 2013-03-03 NOTE — H&P (Signed)
[name] is an 38 y.o. female.   Chief Complaint: right kidney stone HPI: Patient with history of large right renal stone presents today for right percutaneous nephroureteral catheter placement prior to nephrolithotomy.  Past Medical History  Diagnosis Date  . Ectopic pregnancy   . Hypertension     hx pre eclampsia with last preg.  Marland Kitchen Shortness of breath     occasionally with exertion   . Tendonitis     rt hand  . Eczema   . Kidney stone     Past Surgical History  Procedure Laterality Date  . Ectopic pregnancy surgery  2005  . Cesarean section with bilateral tubal ligation Right 10/18/2012    Procedure: REPEAT CESAREAN SECTION WITH BILATERAL TUBAL LIGATION;  Surgeon: Logan Bores, MD;  Location: [facility];  Service: Obstetrics;  Laterality: Right; TOTAL OF 4 C SECTIONS    Family History  Problem Relation Age of Onset  . Diabetes type II Mother   . Hypertension Mother   . Diabetes type II Sister    Social History:  reports that she has been smoking.  She does not have any smokeless tobacco history on file. She reports that she drinks alcohol. She reports that she does not use illicit drugs.  Allergies: No Known Allergies  Medications Prior to Admission  Medication Sig Dispense Refill  . acetaminophen (TYLENOL) 500 MG tablet Take 1,000 mg by mouth every 6 (six) hours as needed for mild pain or moderate pain.      Marland Kitchen ibuprofen (ADVIL,MOTRIN) 200 MG tablet Take 400 mg by mouth every 6 (six) hours as needed for mild pain or moderate pain.        Results for orders placed during the hospital encounter of 03/03/13 (from the past 48 hour(s))  APTT     Status: None   Collection Time    03/03/13  7:25 AM      Result Value Ref Range   aPTT 29  24 - 37 seconds  BASIC METABOLIC PANEL     Status: Abnormal   Collection Time    03/03/13  7:25 AM      Result Value Ref Range   Sodium 140  137 - 147 mEq/L   Potassium 3.6 (*) 3.7 - 5.3 mEq/L   Chloride 104  96 - 112 mEq/L   CO2 22  19 - 32 mEq/L   Glucose, Bld 111 (*) 70 - 99 mg/dL   BUN 14  6 - 23 mg/dL   Creatinine, Ser 0.69  0.50 - 1.10 mg/dL   Calcium 9.3  8.4 - 10.5 mg/dL   GFR calc non Af Amer >90  >90 mL/min   GFR calc Af Amer >90  >90 mL/min   Comment: (NOTE)     The eGFR has been calculated using the CKD EPI equation.     This calculation has not been validated in all clinical situations.     eGFR's persistently <90 mL/min signify possible Chronic Kidney     Disease.  CBC     Status: Abnormal   Collection Time    03/03/13  7:25 AM      Result Value Ref Range   WBC 9.7  4.0 - 10.5 K/uL   RBC 4.63  3.87 - 5.11 MIL/uL   Hemoglobin 11.5 (*) 12.0 - 15.0 g/dL   HCT 34.7 (*) 36.0 - 46.0 %   MCV 74.9 (*) 78.0 - 100.0 fL   MCH 24.8 (*) 26.0 - 34.0 pg  MCHC 33.1  30.0 - 36.0 g/dL   RDW 15.5  11.5 - 15.5 %   Platelets 416 (*) 150 - 400 K/uL  PROTIME-INR     Status: None   Collection Time    03/03/13  7:25 AM      Result Value Ref Range   Prothrombin Time 13.3  11.6 - 15.2 seconds   INR 1.03  0.00 - 1.49   No results found.  Review of Systems  Constitutional: Negative for fever and chills.  Respiratory: Negative for hemoptysis and shortness of breath.        Occ cough  Cardiovascular: Negative for chest pain.  Gastrointestinal: Negative for nausea, vomiting, abdominal pain and blood in stool.  Genitourinary: Positive for flank pain. Negative for hematuria.       Pt with menses now  Musculoskeletal: Positive for back pain.  Neurological: Negative for headaches.  Psychiatric/Behavioral: The patient is nervous/anxious.     Blood pressure 136/82, pulse 71, temperature 97.9 F (36.6 C), temperature source Oral, resp. rate 16, last menstrual period 03/02/2013, SpO2 100.00%, not currently breastfeeding. Physical Exam  Constitutional: She is oriented to person, place, and time. She appears well-developed and well-nourished.  Cardiovascular: Normal rate and regular rhythm.   Respiratory: Effort  normal and breath sounds normal.  GI: Soft. Bowel sounds are normal.  Mild rt CVA tenderness  Musculoskeletal: Normal range of motion. She exhibits no edema.  Neurological: She is alert and oriented to person, place, and time.     Assessment/Plan Patient with history of large right renal stone presents today for right percutaneous nephroureteral catheter placement prior to nephrolithotomy. Details/risks of procedure d/w pt with her understanding and consent.  [name],D KEVIN 03/03/2013, 8:20 AM

## 2013-03-03 NOTE — Transfer of Care (Signed)
Immediate Anesthesia Transfer of Care Note  Patient: [name]  Procedure(s) Performed: Procedure(s): Cystoscopy, Right Ureteroscopy with holmium laser (Right) HOLMIUM LASER APPLICATION (Right)  Patient Location: PACU  Anesthesia Type:General  Level of Consciousness: awake, oriented, patient cooperative, lethargic and responds to stimulation  Airway & Oxygen Therapy: Patient Spontanous Breathing and Patient connected to face mask oxygen  Post-op Assessment: Report given to PACU RN, Post -op Vital signs reviewed and stable and Patient moving all extremities  Post vital signs: Reviewed and stable  Complications: No apparent anesthesia complications

## 2013-03-03 NOTE — Anesthesia Preprocedure Evaluation (Signed)
Anesthesia Evaluation  Patient identified by MRN, date of birth, ID band Patient awake    Reviewed: Allergy & Precautions, H&P , NPO status , Patient's Chart, lab work & pertinent test results  Airway Mallampati: II TM Distance: >3 FB Neck ROM: Full    Dental  (+) Chipped, Dental Advisory Given,    Pulmonary neg pulmonary ROS, shortness of breath and with exertion, Current Smoker,  breath sounds clear to auscultation  Pulmonary exam normal       Cardiovascular hypertension, negative cardio ROS  Rhythm:Regular Rate:Normal     Neuro/Psych negative neurological ROS  negative psych ROS   GI/Hepatic negative GI ROS, Neg liver ROS,   Endo/Other  negative endocrine ROS  Renal/GU Renal diseasenegative Renal ROS  negative genitourinary   Musculoskeletal negative musculoskeletal ROS (+)   Abdominal   Peds negative pediatric ROS (+)  Hematology negative hematology ROS (+)   Anesthesia Other Findings   Reproductive/Obstetrics negative OB ROS 4 months post partum                           Anesthesia Physical Anesthesia Plan  ASA: II  Anesthesia Plan: General   Post-op Pain Management:    Induction: Intravenous  Airway Management Planned: LMA  Additional Equipment:   Intra-op Plan:   Post-operative Plan: Extubation in OR  Informed Consent: I have reviewed the patients History and Physical, chart, labs and discussed the procedure including the risks, benefits and alternatives for the proposed anesthesia with the patient or authorized representative who has indicated his/her understanding and acceptance.   Dental advisory given  Plan Discussed with: CRNA  Anesthesia Plan Comments:         Anesthesia Quick Evaluation

## 2013-03-03 NOTE — Progress Notes (Signed)
Patient admitted from PACU to 1445, alert and oriented, continue to C/O right flank pain, PRN pain medication given in PACU prior to transfer but additional pain medication given. Oriented patient to room and unit and reviewed plan of care with patient. No wound, surgical incision on right flank area, dressing clean/dry/intact. Patient too sleepy to ambulate, will report off to night shift nurse to F/U.

## 2013-03-03 NOTE — Op Note (Signed)
Urology Operative Report  Date of Procedure: 03/03/13  Surgeon: Natalia Leatherwoodaniel Khloe Hunkele, MD Assistant:  None  Preoperative Diagnosis: Right staghorn renal stone Postoperative Diagnosis:  Same  Procedure(s): Right ureteroscopy with laser lithotripsy and stone removal. Right retrograde pyelogram with interpretation. Right ureter stent placement (6 x 24 with tethered). Cystoscopy.  Estimated blood loss: None  Specimen: Stones sent to [facility] for chemical analysis.  Drains: None  Complications: None  Findings: Large right staghorn renal calculus.  History of present illness: 38 year-old female presents today for a large right staghorn renal calculus. The plan was to perform a right-sided percutaneous nephrostolithotomy. This was going to be the first stage a two-stage process. Attempted her kidneys access earlier this morning was not successful in interventional radiology due to the stone taking up most of the calyces and renal pelvis. Because of this we elected to change the procedure to a right retrograde ureteroscopy with laser lithotripsy in order to create more space within the renal pelvis and calyces with the plan of proceeding with percutaneous access tomorrow and PCNL following day. Her stone is larger than 5 cm in size.   Procedure in detail: After informed consent was obtained, the patient was taken to the operating room. They were placed in the supine position. SCDs were turned on and in place. IV antibiotics were infused, and general anesthesia was induced. A timeout was performed in which the correct patient, surgical site, and procedure were identified and agreed upon by the team.  The patient was placed in a dorsolithotomy position, making sure to pad all pertinent neurovascular pressure points. A belladonna and opium suppository was placed into the rectum. The genitals were prepped and draped in the usual sterile fashion.  A rigid cystoscope was advanced through the  urethra and into the bladder. The bladder was drained. It was then fully distended and evaluated in a systematic fashion. This was negative for tumors.  Attention was turned to the right ureter orifice. 2 sensor wires were placed up the orifice and into the right renal pelvis. It was noted that the patient had a prominent right extrarenal pelvis. One wire was secured as a safety wire with the other was used as a working wire. A 12/14 ureter access sheath was placed into the distal ureter over the wire under fluoroscopy. The wire and obturator were removed.  A flexible digital ureter scope was then placed through the access sheath, into the ureter, and this was navigated up into the renal pelvis. There were no stones along the length of the ureter. I then placed a second sensor wire through the ureter scope and withdrew the ureter scope and the access sheath. Access sheath was then placed over the safety wire and the obturator and safety wire were removed. The end of the access sheath was left in the proximal ureter because this made an almost right angle turn into the renal pelvis.  I then placed the flexible digital ureter scope through the access sheath and into the right renal pelvis. There was a large stone seen obstructing the area. I used a 200  holmium laser filament and perform lithotripsy at 0.5 J and 5 Hz and answer 0.5 J and 20 Hz. Lithotripsy was carried out until I was able to access the o except in one of the middle calyces. This took a prolonged amount of time due to the large size of the stone. Once a satisfactory amount of stone was broken up elected to perform a retrograde  pyelogram.  I performed a retrograde pyelogram by withdrawing the ureter scope in the proximal ureter and injecting 12 cc of Omnipaque through the scope and into the collecting system. There was filling defect consistent with stone. I also saw the lateral posterior calyces in the inferior calyx filled contrast but I did  not see the superior calyx will contrast.  I then withdrew the ureter scope and access sheath and removed several of the broken up stone fragments. These fragments were set aside and taken to the last urology lab for chemical analysis.  I then loaded the safety wire through the cystoscope and placed a 6 x 24 double-J ureter stent with the proximal end deployed in the right renal pelvis and the distal end of the bladder. The tethering string was left in place and this was secured to the patient's abdomen. This allowed for removal of the stent percutaneous access either tomorrow or the next day.  The bladder was drained. I then placed 10 cc of lidocaine into the urethra.  The patient was placed back in a supine position, anesthesia was reversed, and she was taken to the PACU in stable condition.  All counts were correct at the end of the case.  She'll be kept overnight for observation in preparation for right percutaneous access for PCNL.

## 2013-03-04 ENCOUNTER — Inpatient Hospital Stay (HOSPITAL_COMMUNITY): Payer: Medicaid Other

## 2013-03-04 ENCOUNTER — Other Ambulatory Visit: Payer: Self-pay | Admitting: Urology

## 2013-03-04 ENCOUNTER — Encounter (HOSPITAL_COMMUNITY): Payer: Self-pay | Admitting: Radiology

## 2013-03-04 LAB — CBC
HEMATOCRIT: 32.4 % — AB (ref 36.0–46.0)
HEMOGLOBIN: 10.7 g/dL — AB (ref 12.0–15.0)
MCH: 24.8 pg — ABNORMAL LOW (ref 26.0–34.0)
MCHC: 33 g/dL (ref 30.0–36.0)
MCV: 75 fL — ABNORMAL LOW (ref 78.0–100.0)
Platelets: 373 10*3/uL (ref 150–400)
RBC: 4.32 MIL/uL (ref 3.87–5.11)
RDW: 15.7 % — ABNORMAL HIGH (ref 11.5–15.5)
WBC: 19.2 10*3/uL — AB (ref 4.0–10.5)

## 2013-03-04 LAB — BASIC METABOLIC PANEL
BUN: 10 mg/dL (ref 6–23)
CHLORIDE: 101 meq/L (ref 96–112)
CO2: 23 meq/L (ref 19–32)
Calcium: 8.5 mg/dL (ref 8.4–10.5)
Creatinine, Ser: 0.9 mg/dL (ref 0.50–1.10)
GFR calc Af Amer: >90 mL/min (ref >90)
GFR calc non Af Amer: 81 mL/min — ABNORMAL LOW (ref >90)
GLUCOSE: 117 mg/dL — AB (ref 70–99)
Potassium: 3.5 mEq/L — ABNORMAL LOW (ref 3.7–5.3)
Sodium: 137 mEq/L (ref 137–147)

## 2013-03-04 MED ORDER — ACETAMINOPHEN 325 MG PO TABS
650.0000 mg | ORAL_TABLET | ORAL | Status: DC | PRN
  Administered 2013-03-04 – 2013-03-07 (×13): 650 mg via ORAL
  Filled 2013-03-04 (×13): qty 2

## 2013-03-04 MED ORDER — DEXTROSE 5 % IV SOLN
1.0000 g | INTRAVENOUS | Status: DC
  Administered 2013-03-04 – 2013-03-05 (×2): 1 g via INTRAVENOUS
  Filled 2013-03-04 (×2): qty 10

## 2013-03-04 MED ORDER — POTASSIUM CHLORIDE 10 MEQ/100ML IV SOLN
10.0000 meq | INTRAVENOUS | Status: AC
  Administered 2013-03-04 (×3): 10 meq via INTRAVENOUS
  Filled 2013-03-04 (×3): qty 100

## 2013-03-04 MED ORDER — FENTANYL CITRATE 0.05 MG/ML IJ SOLN
INTRAMUSCULAR | Status: AC
  Filled 2013-03-04: qty 6

## 2013-03-04 MED ORDER — FENTANYL CITRATE 0.05 MG/ML IJ SOLN
INTRAMUSCULAR | Status: AC | PRN
Start: 2013-03-04 — End: 2013-03-04
  Administered 2013-03-04 (×5): 50 ug via INTRAVENOUS

## 2013-03-04 MED ORDER — LIDOCAINE HCL 1 % IJ SOLN
INTRAMUSCULAR | Status: AC
  Filled 2013-03-04: qty 20

## 2013-03-04 MED ORDER — MIDAZOLAM HCL 2 MG/2ML IJ SOLN
INTRAMUSCULAR | Status: AC | PRN
  Administered 2013-03-04: 2 mg via INTRAVENOUS
  Administered 2013-03-04 (×4): 1 mg via INTRAVENOUS

## 2013-03-04 MED ORDER — MIDAZOLAM HCL 2 MG/2ML IJ SOLN
INTRAMUSCULAR | Status: AC
  Filled 2013-03-04: qty 6

## 2013-03-04 MED ORDER — MIDAZOLAM HCL 2 MG/2ML IJ SOLN
INTRAMUSCULAR | Status: AC
  Filled 2013-03-04: qty 4

## 2013-03-04 NOTE — Progress Notes (Signed)
Urology Progress Note  Subjective:     Patient with fever and tachycardia overnight. Urine and blood cultures done. Patient stable. Has some urinary urgency and right flank pain from ureteroscopy yesterday. NPO since midnight.  ROS: Positive: emesis yesterday after surgery. Negative: chest pain  Objective:  Patient Vitals for the past 24 hrs:  BP Temp Temp src Pulse Resp SpO2 Height Weight  03/04/13 0520 129/62 mmHg 100 F (37.8 C) Oral 112 14 98 % - -  03/04/13 0330 - 100.5 F (38.1 C) - - - - - -  03/04/13 0012 - 102 F (38.9 C) - - - - - -  03/03/13 2300 125/63 mmHg 101 F (38.3 C) Oral 115 16 100 % - -  03/03/13 1716 - - - - - - 5' (1.524 m) 79.4 kg (175 lb 0.7 oz)  03/03/13 1633 138/76 mmHg 98.9 F (37.2 C) - 119 18 100 % - -  03/03/13 1600 115/86 mmHg 98.8 F (37.1 C) - - - - - -  03/03/13 1530 133/76 mmHg - - - - - - -  03/03/13 1504 140/79 mmHg 99.8 F (37.7 C) - 122 15 100 % - -    Physical Exam: General:  No acute distress, awake Cardiovascular:    [x]   S1/S2 present,mild tachycardia.  []   Irregularly irregular Chest:  CTA-B Abdomen:               []  Soft, appropriately TTP  [x]  Soft, NTTP  []  Soft, appropriately TTP, incision(s) clean/dry/intact  Genitourinary: No foley.     I/O last 3 completed shifts: In: 2695.8 [P.O.:360; I.V.:2335.8] Out: 400 [Urine:400]  Recent Labs     03/03/13  0725  03/04/13  0325  HGB  11.5*  10.7*  WBC  9.7  19.2*  PLT  416*  373    Recent Labs     03/03/13  0725  03/04/13  0325  NA  140  137  K  3.6*  3.5*  CL  104  101  CO2  22  23  BUN  14  10  CREATININE  0.69  0.90  CALCIUM  9.3  8.5  GFRNONAA  >90  81*  GFRAA  >90  >90     Recent Labs     03/03/13  0725  INR  1.03  APTT  29     No components found with this basename: ABG,     Length of stay: 1 days.  Assessment: Right staghorn renal stone. Fever (likely due to infected stone)  03/03/13- Right ureteroscopy      Plan: -Start rocephin  today. -Follow urine/blood cultures. -Increase IV fluids to 150 mL/hr. -Replace potassium IV. -Plan for right renal percutaneous access today by IR in preparation for PCNL tomorrow. Have requested 2 access sites (mid and lower pole). NPO for that procedure since midnight.  -OK to have regular diet after IR procedure today. Will need to be NPO at midnight tomorrow.   Natalia Leatherwoodaniel Karlina Suares, MD [phone]

## 2013-03-04 NOTE — Progress Notes (Addendum)
Pt has elevated temp after Tylendol was given at 0040 for temp101--current temp 102. NP on call notified and orders received. SRP, RN

## 2013-03-04 NOTE — Progress Notes (Signed)
Patient from interventional radiology post percutaneous access, patient arousable and follows command. Surgical dressing on right flank clean/dry/intact. Elevated temp 101.3 PRN tylenol given and Annia BeltAmanda Yarbourgh- PA notified that patient is back in the room and of the elevated temperature. Will continue to monitor patient.

## 2013-03-04 NOTE — Progress Notes (Signed)
Patient ID: [name], female   DOB: 12-01-1975, 38 y.o.   MRN: [id number] Post-op note  Subjective: The patient recently returned from IR after access placement for PCNL to be performed tomorrow by Dr. Margarita GrizzleWoodruff.  She is very sleepy. Temp elevated to 101 earlier this afternoon.  She received Tylenol.  Currently without complaints.    Objective: Vital signs in last 24 hours: Temp:  [98.8 F (37.1 C)-102 F (38.9 C)] 101.3 F (38.5 C) (03/03 1319) Pulse Rate:  [112-124] 124 (03/03 1319) Resp:  [14-26] 16 (03/03 1319) BP: (115-144)/(62-92) 144/82 mmHg (03/03 1319) SpO2:  [95 %-100 %] 95 % (03/03 1319) Weight:  [79.4 kg (175 lb 0.7 oz)] 79.4 kg (175 lb 0.7 oz) (03/02 1716)  Intake/Output from previous day: 03/02 0701 - 03/03 0700 In: 2695.8 [P.O.:360; I.V.:2335.8] Out: 400 [Urine:400] Intake/Output this shift: Total I/O In: 0  Out: 300 [Urine:300]  Physical Exam:  General: Alert and oriented. Abdomen: Soft, Nondistended. Site: Clean and dry.  Lab Results:  Recent Labs  03/03/13 0725 03/04/13 0325  HGB 11.5* 10.7*  HCT 34.7* 32.4*    Assessment/Plan: Pt stable after IR procedure.  Continue prn Tylenol for fevers.  Receiving Rocephin.  Continue IVF.  She can eat/drink today but will need to be NPO after MN tonight in anticipation of procedure tomorrow.  She will be re-dosed with amp/gent IV prior to surgery as well.     LOS: 1 day   YARBROUGH,Tannisha Kennington G. 03/04/2013, 2:52 PM

## 2013-03-04 NOTE — Progress Notes (Signed)
ANTIBIOTIC CONSULT NOTE - INITIAL  Pharmacy Consult for Rocephin Indication: Fever, suspected infected renal stone  No Known Allergies  Patient Measurements: Height: 5' (152.4 cm) Weight: 175 lb 0.7 oz (79.4 kg) IBW/kg (Calculated) : 45.5   Vital Signs: Temp: 100 F (37.8 C) (03/03 0520) Temp src: Oral (03/03 0520) BP: 129/62 mmHg (03/03 0520) Pulse Rate: 112 (03/03 0520) Intake/Output from previous day: 03/02 0701 - 03/03 0700 In: 2695.8 [P.O.:360; I.V.:2335.8] Out: 400 [Urine:400] Intake/Output from this shift:    Labs:  Recent Labs  03/03/13 0725 03/04/13 0325  WBC 9.7 19.2*  HGB 11.5* 10.7*  PLT 416* 373  CREATININE 0.69 0.90   Estimated Creatinine Clearance: 80.6 ml/min (by C-G formula based on Cr of 0.9). No results found for this basename: VANCOTROUGH, VANCOPEAK, VANCORANDOM, GENTTROUGH, GENTPEAK, GENTRANDOM, TOBRATROUGH, TOBRAPEAK, TOBRARND, AMIKACINPEAK, AMIKACINTROU, AMIKACIN,  in the last 72 hours   Microbiology: No results found for this or any previous visit (from the past 720 hour(s)).  Medical History: Past Medical History  Diagnosis Date  . Ectopic pregnancy   . Hypertension     hx pre eclampsia with last preg.  Marland Kitchen. Shortness of breath     occasionally with exertion   . Tendonitis     rt hand  . Eczema   . Kidney stone     Medications:  Scheduled:  . cefTRIAXone (ROCEPHIN)  IV  1 g Intravenous Q24H  . potassium chloride  10 mEq Intravenous Q1 Hr x 3  . senna-docusate  1 tablet Oral BID   Infusions:  . sodium chloride 50 mL/hr at 03/04/13 0736   PRN: acetaminophen, hyoscyamine, morphine injection, ondansetron, opium-belladonna, oxybutynin, oxyCODONE Assessment:  38 yo with R staghorn renal stone, s/p lithotripsy and stone removal and ureter stent placement on 3/2.  Now POD#1 with fever and starting Rocephin for suspected renal stone infection.   WBC today elevated 19.2 (9.7 on 3/2)  Fever 101   Scr remains WNL with estimated CrCl  80 ml/min  Micro   3/3 Blood x 2: Sent  3/3 Urine: Sent  Antibiotics  3/2 >> Cirpo 400 x 1 (Pre-op) 3/2 >> Ampicillin 2 gram x 1 (Pre-op) 3/2 >> Gentamicin 290 mg x 1 (Pre-op) 3/3 >> Rocephin >>   Goal of Therapy:  Eradication of infection   Plan:  1.) Rocephin 1 gram Q24h 2.) f/u cultures and WBC 3.) No renal adjustment needed, Pharmacy will sign off and follow peripherally.   Jhett Fretwell, Loma MessingMary Patricia PharmD Pager #: [phone] 7:52 AM 03/04/2013

## 2013-03-04 NOTE — Progress Notes (Signed)
Subjective: Pt went to OR yesterday after attempted antegrade perc access was not successful. Had laser litho, partial stone removal, and placement of ureteral stent IR requested to re-attempt perc antegrade access for completion PCNL. She developed post procedure fever and increased WBC. She is aware for request of repeat attempt.  Objective: Physical Exam: BP 129/62  Pulse 112  Temp(Src) 100 F (37.8 C) (Oral)  Resp 14  Ht 5' (1.524 m)  Wt 175 lb 0.7 oz (79.4 kg)  BMI 34.19 kg/m2  SpO2 98%  LMP 03/02/2013  Breastfeeding? No Lungs: CTA without w/r/r Heart: Regular Abdomen: soft, NT      Labs: CBC  Recent Labs  03/03/13 0725 03/04/13 0325  WBC 9.7 19.2*  HGB 11.5* 10.7*  HCT 34.7* 32.4*  PLT 416* 373   BMET  Recent Labs  03/03/13 0725 03/04/13 0325  NA 140 137  K 3.6* 3.5*  CL 104 101  CO2 22 23  GLUCOSE 111* 117*  BUN 14 10  CREATININE 0.69 0.90  CALCIUM 9.3 8.5   LFT No results found for this basename: PROT, ALBUMIN, AST, ALT, ALKPHOS, BILITOT, BILIDIR, IBILI, LIPASE,  in the last 72 hours PT/INR  Recent Labs  03/03/13 0725  LABPROT 13.3  INR 1.03     Studies/Results: Dg Abd 1 View  03/03/2013   CLINICAL DATA:  Kidney stone  EXAM: ABDOMEN - 1 VIEW  COMPARISON:  None.  FINDINGS: A ureteral scope is positioned in the right renal pelvis. Wire access and contrast in the right renal collecting system is documented. Final image demonstrates a right ureteral stent.  IMPRESSION: Intraoperative imaging for right ureteral stent placement.   Electronically Signed   By: Maryclare BeanArt  Hoss M.D.   On: 03/03/2013 16:05   Ir Nephrostogram Right  03/03/2013   CLINICAL DATA:  Right-sided staghorn renal calculus and need for percutaneous access prior to planned operative percutaneous nephrolithotomy procedure.  EXAM: 1. ULTRASOUND GUIDANCE FOR PUNCTURE OF THE RIGHT RENAL COLLECTING SYSTEM. 2. RIGHT PERCUTANEOUS NEPHROSTOMY. 3. RIGHT NEPHROSTOGRAM.  MEDICATIONS: 400 mg IV  Cipro. Ciprofloxacin was given within two hours of incision.  ANESTHESIA/SEDATION: 8.0 mg IV Versed; 400 mcg IV Fentanyl.  Total Moderate Sedation Time  90 minutes  CONTRAST:  50 ml Omnipaque 300  COMPARISON:  None.  FLUOROSCOPY TIME:  35 min and 48 seconds  PROCEDURE: The procedure, risks, benefits, and alternatives were explained to the patient. Questions regarding the procedure were encouraged and answered. The patient understands and consents to the procedure.  The right flank region was prepped with Betadine in a sterile fashion, and a sterile drape was applied covering the operative field. A sterile gown and sterile gloves were used for the procedure. Local anesthesia was provided with 1% Lidocaine.  Ultrasound was used to localize the right kidney. Under direct ultrasound guidance, a 21 gauge needle was advanced into the renal collecting system. Ultrasound image documentation was performed. Aspiration of urine sample was performed followed by contrast injection.  Additional advancement of 21 gauge needles were performed under fluoroscopy into different portions of the renal collecting system. Contrast injection was performed followed by guidewire advancement. Ultimately, a 5 French catheter was inserted into the collecting system and additional contrast injection performed. The catheter was advanced into the proximal ureter and additional nephrostogram performed. The catheter was advanced as far as the mid ureter. Catheter manipulation and advancement of guidewires were performed. Ultimately, the catheter was removed.  COMPLICATIONS: None.  FINDINGS: Ultrasound was used to localize the right  kidney. A large amount of shadowing calculus material is identified in the collecting system. There is visible dilatation/ obstruction of the upper pole collecting system. Initial needle access was performed at the level of the upper pole. Contrast injection shows dilatation and relative obstruction of the upper pole  collecting system with a calculus extending from the infundibulum into upper pole calices. There does appear to be relative obstruction/ stricture at the level of an upper pole infundibulum.  Extensive irregular staghorn calculi are seen extending throughout the collecting system. The renal pelvis is completely filled with a calculus. Calculi extend into both mid and lower pole collecting systems.  The lower pole collecting system was accessed in 3 separate locations with contrast injection confirming positioning within the collecting system. Guidewire advancement was extremely difficult due to the staghorn calculus and successful advancement to the level of the renal pelvis was only able to be accomplished once. Over a hydrophilic guidewire, a 5 French catheter was able to be advanced into the proximal ureter with contrast injection showing normal ureteral patency. However, due to a loop of catheter in the lower pole collecting system, a 5 French catheter was not able to be advanced beyond the mid ureter and stable access was not able to be accomplished to allow intraoperative dilatation. A mid pole calyx was also punctured with a 21 gauge needle. Guidewire advancement coiled in a dilated calyx and did not advanced to the level of the renal pelvis.  IMPRESSION: Percutaneous nephrostomy access was performed at the upper pole, mid pole and lower pole of the right kidney, as above. There is extensive calculus material throughout the collecting system which made guidewire advancement extremely difficult. Secure access for nephrolithotomy dilatation was unable to be obtained. Findings were discussed with Dr. Margarita Grizzle.   Electronically Signed   By: Irish Lack M.D.   On: 03/03/2013 17:09   Ir Perc Nephrostomy Right  03/03/2013   CLINICAL DATA:  Right-sided staghorn renal calculus and need for percutaneous access prior to planned operative percutaneous nephrolithotomy procedure.  EXAM: 1. ULTRASOUND GUIDANCE FOR  PUNCTURE OF THE RIGHT RENAL COLLECTING SYSTEM. 2. RIGHT PERCUTANEOUS NEPHROSTOMY. 3. RIGHT NEPHROSTOGRAM.  MEDICATIONS: 400 mg IV Cipro. Ciprofloxacin was given within two hours of incision.  ANESTHESIA/SEDATION: 8.0 mg IV Versed; 400 mcg IV Fentanyl.  Total Moderate Sedation Time  90 minutes  CONTRAST:  50 ml Omnipaque 300  COMPARISON:  None.  FLUOROSCOPY TIME:  35 min and 48 seconds  PROCEDURE: The procedure, risks, benefits, and alternatives were explained to the patient. Questions regarding the procedure were encouraged and answered. The patient understands and consents to the procedure.  The right flank region was prepped with Betadine in a sterile fashion, and a sterile drape was applied covering the operative field. A sterile gown and sterile gloves were used for the procedure. Local anesthesia was provided with 1% Lidocaine.  Ultrasound was used to localize the right kidney. Under direct ultrasound guidance, a 21 gauge needle was advanced into the renal collecting system. Ultrasound image documentation was performed. Aspiration of urine sample was performed followed by contrast injection.  Additional advancement of 21 gauge needles were performed under fluoroscopy into different portions of the renal collecting system. Contrast injection was performed followed by guidewire advancement. Ultimately, a 5 French catheter was inserted into the collecting system and additional contrast injection performed. The catheter was advanced into the proximal ureter and additional nephrostogram performed. The catheter was advanced as far as the mid ureter. Catheter manipulation and advancement  of guidewires were performed. Ultimately, the catheter was removed.  COMPLICATIONS: None.  FINDINGS: Ultrasound was used to localize the right kidney. A large amount of shadowing calculus material is identified in the collecting system. There is visible dilatation/ obstruction of the upper pole collecting system. Initial needle access  was performed at the level of the upper pole. Contrast injection shows dilatation and relative obstruction of the upper pole collecting system with a calculus extending from the infundibulum into upper pole calices. There does appear to be relative obstruction/ stricture at the level of an upper pole infundibulum.  Extensive irregular staghorn calculi are seen extending throughout the collecting system. The renal pelvis is completely filled with a calculus. Calculi extend into both mid and lower pole collecting systems.  The lower pole collecting system was accessed in 3 separate locations with contrast injection confirming positioning within the collecting system. Guidewire advancement was extremely difficult due to the staghorn calculus and successful advancement to the level of the renal pelvis was only able to be accomplished once. Over a hydrophilic guidewire, a 5 French catheter was able to be advanced into the proximal ureter with contrast injection showing normal ureteral patency. However, due to a loop of catheter in the lower pole collecting system, a 5 French catheter was not able to be advanced beyond the mid ureter and stable access was not able to be accomplished to allow intraoperative dilatation. A mid pole calyx was also punctured with a 21 gauge needle. Guidewire advancement coiled in a dilated calyx and did not advanced to the level of the renal pelvis.  IMPRESSION: Percutaneous nephrostomy access was performed at the upper pole, mid pole and lower pole of the right kidney, as above. There is extensive calculus material throughout the collecting system which made guidewire advancement extremely difficult. Secure access for nephrolithotomy dilatation was unable to be obtained. Findings were discussed with Dr. Margarita Grizzle.   Electronically Signed   By: Irish Lack M.D.   On: 03/03/2013 17:09   Ir US Guide Bx Asp/drain  03/03/2013   CLINICAL DATA:  Right-sided staghorn renal calculus and need for  percutaneous access prior to planned operative percutaneous nephrolithotomy procedure.  EXAM: 1. ULTRASOUND GUIDANCE FOR PUNCTURE OF THE RIGHT RENAL COLLECTING SYSTEM. 2. RIGHT PERCUTANEOUS NEPHROSTOMY. 3. RIGHT NEPHROSTOGRAM.  MEDICATIONS: 400 mg IV Cipro. Ciprofloxacin was given within two hours of incision.  ANESTHESIA/SEDATION: 8.0 mg IV Versed; 400 mcg IV Fentanyl.  Total Moderate Sedation Time  90 minutes  CONTRAST:  50 ml Omnipaque 300  COMPARISON:  None.  FLUOROSCOPY TIME:  35 min and 48 seconds  PROCEDURE: The procedure, risks, benefits, and alternatives were explained to the patient. Questions regarding the procedure were encouraged and answered. The patient understands and consents to the procedure.  The right flank region was prepped with Betadine in a sterile fashion, and a sterile drape was applied covering the operative field. A sterile gown and sterile gloves were used for the procedure. Local anesthesia was provided with 1% Lidocaine.  Ultrasound was used to localize the right kidney. Under direct ultrasound guidance, a 21 gauge needle was advanced into the renal collecting system. Ultrasound image documentation was performed. Aspiration of urine sample was performed followed by contrast injection.  Additional advancement of 21 gauge needles were performed under fluoroscopy into different portions of the renal collecting system. Contrast injection was performed followed by guidewire advancement. Ultimately, a 5 French catheter was inserted into the collecting system and additional contrast injection performed. The catheter was advanced  into the proximal ureter and additional nephrostogram performed. The catheter was advanced as far as the mid ureter. Catheter manipulation and advancement of guidewires were performed. Ultimately, the catheter was removed.  COMPLICATIONS: None.  FINDINGS: Ultrasound was used to localize the right kidney. A large amount of shadowing calculus material is identified in  the collecting system. There is visible dilatation/ obstruction of the upper pole collecting system. Initial needle access was performed at the level of the upper pole. Contrast injection shows dilatation and relative obstruction of the upper pole collecting system with a calculus extending from the infundibulum into upper pole calices. There does appear to be relative obstruction/ stricture at the level of an upper pole infundibulum.  Extensive irregular staghorn calculi are seen extending throughout the collecting system. The renal pelvis is completely filled with a calculus. Calculi extend into both mid and lower pole collecting systems.  The lower pole collecting system was accessed in 3 separate locations with contrast injection confirming positioning within the collecting system. Guidewire advancement was extremely difficult due to the staghorn calculus and successful advancement to the level of the renal pelvis was only able to be accomplished once. Over a hydrophilic guidewire, a 5 French catheter was able to be advanced into the proximal ureter with contrast injection showing normal ureteral patency. However, due to a loop of catheter in the lower pole collecting system, a 5 French catheter was not able to be advanced beyond the mid ureter and stable access was not able to be accomplished to allow intraoperative dilatation. A mid pole calyx was also punctured with a 21 gauge needle. Guidewire advancement coiled in a dilated calyx and did not advanced to the level of the renal pelvis.  IMPRESSION: Percutaneous nephrostomy access was performed at the upper pole, mid pole and lower pole of the right kidney, as above. There is extensive calculus material throughout the collecting system which made guidewire advancement extremely difficult. Secure access for nephrolithotomy dilatation was unable to be obtained. Findings were discussed with Dr. Margarita Grizzle.   Electronically Signed   By: Irish Lack M.D.   On:  03/03/2013 17:09   Dg C-arm 61-120 Min-no Report  03/03/2013   CLINICAL DATA: rt renal stone   C-ARM 61-120 MINUTES  Fluoroscopy was utilized by the requesting physician.  No radiographic  interpretation.     Assessment/Plan: Large right renal staghorn calculus, partial removed with laser lithotripsy yesterday. Discussed plan for repeat attemt at percutaneous antegrade access--PCN. Request is for both mid pole and lower pole access. Explained procedure, risks, complications, use o fsedation. Labs reviewed. Dr. Grace Isaac has requested unenhanced CT abd/pelvis to assess post-op changes prior to IR intervention. Consent signed in chart.  Brayton El PA-C Interventional Radiology 03/04/2013 11:17 AM      LOS: 1 day    Brayton El PA-C 03/04/2013 11:12 AM

## 2013-03-05 ENCOUNTER — Inpatient Hospital Stay (HOSPITAL_COMMUNITY): Admission: RE | Admit: 2013-03-05 | Payer: Medicaid Other | Source: Ambulatory Visit | Admitting: Urology

## 2013-03-05 ENCOUNTER — Encounter (HOSPITAL_COMMUNITY)
Admission: RE | Disposition: A | Discharge status: Home / Self Care | Payer: Self-pay | Source: Ambulatory Visit | Attending: Urology

## 2013-03-05 LAB — CBC
HCT: 34.2 % — ABNORMAL LOW (ref 36.0–46.0)
HEMOGLOBIN: 11.2 g/dL — AB (ref 12.0–15.0)
MCH: 24.8 pg — ABNORMAL LOW (ref 26.0–34.0)
MCHC: 32.7 g/dL (ref 30.0–36.0)
MCV: 75.7 fL — ABNORMAL LOW (ref 78.0–100.0)
Platelets: 283 10*3/uL (ref 150–400)
RBC: 4.52 MIL/uL (ref 3.87–5.11)
RDW: 15.9 % — AB (ref 11.5–15.5)
WBC: 16.4 10*3/uL — AB (ref 4.0–10.5)

## 2013-03-05 LAB — URINE CULTURE
Colony Count: NO GROWTH
Culture: NO GROWTH

## 2013-03-05 LAB — BASIC METABOLIC PANEL
BUN: 8 mg/dL (ref 6–23)
CO2: 22 mEq/L (ref 19–32)
Calcium: 8.8 mg/dL (ref 8.4–10.5)
Chloride: 102 mEq/L (ref 96–112)
Creatinine, Ser: 1.01 mg/dL (ref 0.50–1.10)
GFR, EST AFRICAN AMERICAN: 82 mL/min — AB (ref >90)
GFR, EST NON AFRICAN AMERICAN: 71 mL/min — AB (ref >90)
GLUCOSE: 113 mg/dL — AB (ref 70–99)
POTASSIUM: 3.8 meq/L (ref 3.7–5.3)
SODIUM: 137 meq/L (ref 137–147)

## 2013-03-05 SURGERY — NEPHROLITHOTOMY PERCUTANEOUS SECOND LOOK
Anesthesia: Choice | Laterality: Right

## 2013-03-05 MED ORDER — SODIUM CHLORIDE 0.9 % IV SOLN
2.0000 g | Freq: Four times a day (QID) | INTRAVENOUS | Status: DC
  Filled 2013-03-05 (×2): qty 2000

## 2013-03-05 MED ORDER — HYDROMORPHONE HCL PF 1 MG/ML IJ SOLN
1.0000 mg | INTRAMUSCULAR | Status: DC | PRN
Start: 2013-03-05 — End: 2013-03-09
  Administered 2013-03-05 – 2013-03-09 (×20): 1 mg via INTRAVENOUS
  Filled 2013-03-05 (×20): qty 1

## 2013-03-05 MED ORDER — SODIUM CHLORIDE 0.9 % IV SOLN
2.0000 g | Freq: Four times a day (QID) | INTRAVENOUS | Status: DC
  Administered 2013-03-05: 2 g via INTRAVENOUS
  Filled 2013-03-05 (×3): qty 2000

## 2013-03-05 MED ORDER — OXYBUTYNIN CHLORIDE ER 10 MG PO TB24
10.0000 mg | ORAL_TABLET | Freq: Every day | ORAL | Status: DC
  Administered 2013-03-05 – 2013-03-08 (×4): 10 mg via ORAL
  Filled 2013-03-05 (×6): qty 1

## 2013-03-05 MED ORDER — METHOCARBAMOL 500 MG PO TABS
500.0000 mg | ORAL_TABLET | Freq: Three times a day (TID) | ORAL | Status: AC
  Administered 2013-03-05 – 2013-03-06 (×6): 500 mg via ORAL
  Filled 2013-03-05 (×8): qty 1

## 2013-03-05 MED ORDER — GENTAMICIN SULFATE 40 MG/ML IJ SOLN
5.0000 mg/kg | INTRAVENOUS | Status: DC
  Filled 2013-03-05: qty 7.5

## 2013-03-05 MED ORDER — GENTAMICIN SULFATE 40 MG/ML IJ SOLN
410.0000 mg | INTRAVENOUS | Status: DC
  Administered 2013-03-05: 410 mg via INTRAVENOUS
  Filled 2013-03-05 (×2): qty 10.25

## 2013-03-05 MED ORDER — AMPICILLIN SODIUM 2 G IJ SOLR
2.0000 g | Freq: Four times a day (QID) | INTRAMUSCULAR | Status: DC
  Administered 2013-03-05 – 2013-03-06 (×3): 2 g via INTRAVENOUS
  Filled 2013-03-05 (×5): qty 2000

## 2013-03-05 NOTE — Progress Notes (Signed)
Urology Progress Note  Subjective:     Continues to have high fever and mild tachycardia. Urine & blood cultures still pending.  ROS: Positive: urinary incontinence and right flank pain. Negative: chest pain or SOB  Objective:  Patient Vitals for the past 24 hrs:  BP Temp Temp src Pulse Resp SpO2  03/05/13 0500 145/87 mmHg 100.9 F (38.3 C) Oral 110 22 97 %  03/04/13 2339 136/77 mmHg 102.3 F (39.1 C) Oral 115 14 96 %  03/04/13 1910 - 102.3 F (39.1 C) Oral - - -  03/04/13 1658 - 100.6 F (38.1 C) - - - -  03/04/13 1422 - 100 F (37.8 C) - - - -  03/04/13 1257 130/82 mmHg - - 123 19 95 %  03/04/13 1250 121/75 mmHg - - 122 18 100 %  03/04/13 1245 124/82 mmHg - - 118 17 100 %  03/04/13 1239 121/76 mmHg - - 113 17 99 %  03/04/13 1234 131/77 mmHg - - 114 18 100 %  03/04/13 1229 126/69 mmHg - - 115 18 98 %  03/04/13 1224 127/63 mmHg - - 114 19 98 %  03/04/13 1219 121/76 mmHg - - 114 17 97 %  03/04/13 1214 141/92 mmHg - - 114 18 95 %  03/04/13 1213 137/90 mmHg - - 114 20 98 %  03/04/13 1205 131/89 mmHg - - 116 26 98 %    Physical Exam: General:  No acute distress, awake Cardiovascular:    [x]   S1/S2 present,mild tachycardia.  []   Irregularly irregular Chest:  CTA-B Abdomen:               []  Soft, appropriately TTP  [x]  Soft, NTTP, right flank without ecchymosis.  []  Soft, appropriately TTP, incision(s) clean/dry/intact  Genitourinary: No foley.     I/O last 3 completed shifts: In: 3455.8 [P.O.:360; I.V.:3095.8] Out: 1300 [Urine:1300]  Recent Labs     03/03/13  0725  03/04/13  0325  HGB  11.5*  10.7*  WBC  9.7  19.2*  PLT  416*  373    Recent Labs     03/03/13  0725  03/04/13  0325  NA  140  137  K  3.6*  3.5*  CL  104  101  CO2  22  23  BUN  14  10  CREATININE  0.69  0.90  CALCIUM  9.3  8.5  GFRNONAA  >90  81*  GFRAA  >90  >90     Recent Labs     03/03/13  0725  INR  1.03  APTT  29     No components found with this basename: ABG,      Length of stay: 2 days.  Assessment: Right staghorn renal stone. Fever (likely due to infected stone)  03/03/13- Right ureteroscopy       Plan: -Cancel surgery today as I don't think it will be safe to proceed in face of ongoing fever.  -D/c rocephin. Start gent & ampicillin.  -Continue IV fluids.  -Regular diet.   Natalia Leatherwoodaniel Annel Zunker, MD [phone]

## 2013-03-05 NOTE — Progress Notes (Signed)
ANTIBIOTIC CONSULT NOTE - INITIAL  Pharmacy Consult for Gentamicin, Ampicillin Indication: UTI, pyelonephritis  No Known Allergies  Patient Measurements: Height: 5' (152.4 cm) Weight: 175 lb 0.7 oz (79.4 kg) IBW/kg (Calculated) : 45.5 ABW = 59 kg  Vital Signs: Temp: 100.9 F (38.3 C) (03/04 0500) Temp src: Oral (03/04 0500) BP: 145/87 mmHg (03/04 0500) Pulse Rate: 110 (03/04 0500) Intake/Output from previous day: 03/03 0701 - 03/04 0700 In: 2645 [I.V.:2645] Out: 900 [Urine:900]  Labs:  Recent Labs  03/03/13 0725 03/04/13 0325 03/05/13 0735  WBC 9.7 19.2* 16.4*  HGB 11.5* 10.7* 11.2*  PLT 416* 373 283  CREATININE 0.69 0.90 1.01   Estimated Creatinine Clearance: 71.8 ml/min (by C-G formula based on Cr of 1.01).   Microbiology: Recent Results (from the past 720 hour(s))  CULTURE, BLOOD (ROUTINE X 2)     Status: None   Collection Time    03/04/13  2:37 AM      Result Value Ref Range Status   Specimen Description BLOOD LEFT ARM   Final   Special Requests BOTTLES DRAWN AEROBIC AND ANAEROBIC 5CC   Final   Culture  Setup Time     Final   Value: 03/04/2013 08:57     Performed at [facility]   Culture     Final   Value:        BLOOD CULTURE RECEIVED NO GROWTH TO DATE CULTURE WILL BE HELD FOR 5 DAYS BEFORE ISSUING A FINAL NEGATIVE REPORT     Performed at [facility]   Report Status PENDING   Incomplete  CULTURE, BLOOD (ROUTINE X 2)     Status: None   Collection Time    03/04/13  2:42 AM      Result Value Ref Range Status   Specimen Description BLOOD LEFT ARM   Final   Special Requests BOTTLES DRAWN AEROBIC AND ANAEROBIC 5CC    Final   Culture  Setup Time     Final   Value: 03/04/2013 08:56     Performed at [facility]   Culture     Final   Value:        BLOOD CULTURE RECEIVED NO GROWTH TO DATE CULTURE WILL BE HELD FOR 5 DAYS BEFORE ISSUING A FINAL NEGATIVE REPORT     Performed at [facility]   Report Status PENDING    Incomplete  URINE CULTURE     Status: None   Collection Time    03/04/13  4:23 AM      Result Value Ref Range Status   Specimen Description URINE, CLEAN CATCH   Final   Special Requests NONE   Final   Culture  Setup Time     Final   Value: 03/04/2013 08:59     Performed at [facility] Count     Final   Value: NO GROWTH     Performed at [facility]   Culture     Final   Value: NO GROWTH     Performed at [facility]   Report Status 03/05/2013 FINAL   Final    Medical History: Past Medical History  Diagnosis Date  . Ectopic pregnancy   . Hypertension     hx pre eclampsia with last preg.  Marland Kitchen Shortness of breath     occasionally with exertion   . Tendonitis     rt hand  . Eczema   . Kidney stone  Anti-infectives 3/2 >> Ampicillin 2 gram x 1 (Pre-op) 3/2 >> Gentamicin 290 mg x 1 (Pre-op) 3/3 >> Rocephin >> 3/4 3/4 >> Ampicillin >> 3/4 >> Natasha BenceGent >>    Assessment: [age] yoF admitted 3/2 with large right staghorn kidney stone.  S/p first procedure, retrograde ureteroscopy and lithotripsy w/ holmium laster on 3/2 with plan for second procedure, right percutaneous nephrostolithotomy, on 3/4.  Surgery today is canceled d/t ongoing fever.  Pharmacy is consulted to dose gentamicin and ampicillin for UTI, pyelonephritis.   Previous Gentamicin 290mg  (5 mg/kg) and ampicillin given pre-op on 3/2.  Ceftriaxone d/c, last dose given on 3/4  Tmax:  102.3  WBC:  16.4  Renal: SCr 1.01 (trending up), CrCl ~ 72 ml/min CG  Blood and urine cultures: no growth to date.    Goal of Therapy:  Gentamicin trough level <1 mcg/ml Appropriate abx dosing, eradication of infection.   Plan:   Ampicillin 2g IV q6h.  Gentamicin, extended interval dosing, 410 mg IV q24h.  Gentamicin level after first dose.  Follow up renal fxn and culture results.   Lynann Beaverhristine Gaelen Brager PharmD, BCPS Pager [phone] 03/05/2013 10:26 AM

## 2013-03-05 NOTE — Progress Notes (Signed)
Patient experiencing new onset of incontinence since procedure on 3/3. Steady dripping at all times while in bed and during activity. Having to change bed linens every couple hours to keep dry. Pt verbalized concern. On call MD called to make aware. Was told to manage situation until procedure today 3/4. Also made on call MD aware of on-going fever. Will continue to monitor closely. Fraser DinMorris, Nimsi Males C, RN

## 2013-03-06 ENCOUNTER — Other Ambulatory Visit: Payer: Self-pay | Admitting: Urology

## 2013-03-06 LAB — CBC
HCT: 30.7 % — ABNORMAL LOW (ref 36.0–46.0)
Hemoglobin: 10.1 g/dL — ABNORMAL LOW (ref 12.0–15.0)
MCH: 24.5 pg — ABNORMAL LOW (ref 26.0–34.0)
MCHC: 32.9 g/dL (ref 30.0–36.0)
MCV: 74.5 fL — ABNORMAL LOW (ref 78.0–100.0)
Platelets: 292 10*3/uL (ref 150–400)
RBC: 4.12 MIL/uL (ref 3.87–5.11)
RDW: 15.8 % — ABNORMAL HIGH (ref 11.5–15.5)
WBC: 14.7 10*3/uL — AB (ref 4.0–10.5)

## 2013-03-06 LAB — BASIC METABOLIC PANEL
BUN: 11 mg/dL (ref 6–23)
CHLORIDE: 100 meq/L (ref 96–112)
CO2: 21 meq/L (ref 19–32)
Calcium: 8.3 mg/dL — ABNORMAL LOW (ref 8.4–10.5)
Creatinine, Ser: 1.14 mg/dL — ABNORMAL HIGH (ref 0.50–1.10)
GFR calc Af Amer: 71 mL/min — ABNORMAL LOW (ref >90)
GFR calc non Af Amer: 61 mL/min — ABNORMAL LOW (ref >90)
Glucose, Bld: 108 mg/dL — ABNORMAL HIGH (ref 70–99)
Potassium: 3.7 mEq/L (ref 3.7–5.3)
Sodium: 135 mEq/L — ABNORMAL LOW (ref 137–147)

## 2013-03-06 LAB — GENTAMICIN LEVEL, TROUGH: Gentamicin Trough: 2 ug/mL (ref 0.5–2.0)

## 2013-03-06 MED ORDER — HYDROMORPHONE HCL 2 MG PO TABS
2.0000 mg | ORAL_TABLET | ORAL | Status: DC | PRN
  Administered 2013-03-06: 4 mg via ORAL
  Administered 2013-03-06: 2 mg via ORAL
  Administered 2013-03-07: 4 mg via ORAL
  Filled 2013-03-06: qty 2
  Filled 2013-03-06 (×2): qty 1

## 2013-03-06 MED ORDER — AMOXICILLIN-POT CLAVULANATE 875-125 MG PO TABS
1.0000 | ORAL_TABLET | Freq: Two times a day (BID) | ORAL | Status: DC
  Administered 2013-03-06 – 2013-03-09 (×7): 1 via ORAL
  Filled 2013-03-06 (×8): qty 1

## 2013-03-06 MED ORDER — BISACODYL 10 MG RE SUPP
10.0000 mg | Freq: Two times a day (BID) | RECTAL | Status: DC
  Administered 2013-03-06 – 2013-03-08 (×4): 10 mg via RECTAL
  Filled 2013-03-06 (×4): qty 1

## 2013-03-06 NOTE — Progress Notes (Signed)
ANTIBIOTIC CONSULT NOTE - FOLLOW UP  Pharmacy Consult for Gentamicin Indication: UTI, pyelonephritis  No Known Allergies  Patient Measurements: Height: 5' (152.4 cm) Weight: 175 lb 0.7 oz (79.4 kg) IBW/kg (Calculated) : 45.5 Adjusted Body Weight:   Vital Signs: Temp: 99 F (37.2 C) (03/04 2055) Temp src: Oral (03/04 2055) BP: 142/82 mmHg (03/04 2055) Pulse Rate: 110 (03/04 2055) Intake/Output from previous day: 03/04 0701 - 03/05 0700 In: 2030.3 [P.O.:120; I.V.:1650; IV Piggyback:260.3] Out: 750 [Urine:750] Intake/Output from this shift: Total I/O In: -  Out: 750 [Urine:750]  Labs:  Recent Labs  03/03/13 0725 03/04/13 0325 03/05/13 0735 03/06/13 0430  WBC 9.7 19.2* 16.4* 14.7*  HGB 11.5* 10.7* 11.2* 10.1*  PLT 416* 373 283 292  CREATININE 0.69 0.90 1.01  --    Estimated Creatinine Clearance: 71.8 ml/min (by C-G formula based on Cr of 1.01).  Recent Labs  03/05/13 2200  GENTTROUGH 2.0     Microbiology: Recent Results (from the past 720 hour(s))  CULTURE, BLOOD (ROUTINE X 2)     Status: None   Collection Time    03/04/13  2:37 AM      Result Value Ref Range Status   Specimen Description BLOOD LEFT ARM   Final   Special Requests BOTTLES DRAWN AEROBIC AND ANAEROBIC 5CC   Final   Culture  Setup Time     Final   Value: 03/04/2013 08:57     Performed at [facility]   Culture     Final   Value:        BLOOD CULTURE RECEIVED NO GROWTH TO DATE CULTURE WILL BE HELD FOR 5 DAYS BEFORE ISSUING A FINAL NEGATIVE REPORT     Performed at [facility]   Report Status PENDING   Incomplete  CULTURE, BLOOD (ROUTINE X 2)     Status: None   Collection Time    03/04/13  2:42 AM      Result Value Ref Range Status   Specimen Description BLOOD LEFT ARM   Final   Special Requests BOTTLES DRAWN AEROBIC AND ANAEROBIC 5CC    Final   Culture  Setup Time     Final   Value: 03/04/2013 08:56     Performed at [facility]   Culture     Final   Value:         BLOOD CULTURE RECEIVED NO GROWTH TO DATE CULTURE WILL BE HELD FOR 5 DAYS BEFORE ISSUING A FINAL NEGATIVE REPORT     Performed at [facility]   Report Status PENDING   Incomplete  URINE CULTURE     Status: None   Collection Time    03/04/13  4:23 AM      Result Value Ref Range Status   Specimen Description URINE, CLEAN CATCH   Final   Special Requests NONE   Final   Culture  Setup Time     Final   Value: 03/04/2013 08:59     Performed at [facility] Count     Final   Value: NO GROWTH     Performed at [facility]   Culture     Final   Value: NO GROWTH     Performed at [facility]   Report Status 03/05/2013 FINAL   Final    Anti-infectives   Start     Dose/Rate Route Frequency Ordered Stop   03/05/13 1400  ampicillin (OMNIPEN) 2 g in sodium chloride  0.9 % 50 mL IVPB     2 g 150 mL/hr over 20 Minutes Intravenous 4 times per day 03/05/13 1026     03/05/13 1100  gentamicin (GARAMYCIN) 410 mg in dextrose 5 % 100 mL IVPB     410 mg 110.3 mL/hr over 60 Minutes Intravenous Every 24 hours 03/05/13 1023     03/05/13 0845  ampicillin (OMNIPEN) 2 g in sodium chloride 0.9 % 50 mL IVPB  Status:  Discontinued     2 g 150 mL/hr over 20 Minutes Intravenous 4 times per day 03/05/13 0844 03/05/13 0926   03/05/13 0730  ampicillin (OMNIPEN) 2 g in sodium chloride 0.9 % 50 mL IVPB  Status:  Discontinued     2 g 150 mL/hr over 20 Minutes Intravenous 4 times per day 03/05/13 0650 03/05/13 1017   03/05/13 0656  gentamicin (GARAMYCIN) 300 mg in dextrose 5 % 100 mL IVPB  Status:  Discontinued     5 mg/kg  59.1 kg (Adjusted) 107.5 mL/hr over 60 Minutes Intravenous 60 min pre-op 03/05/13 0656 03/05/13 1017   03/04/13 0900  cefTRIAXone (ROCEPHIN) 1 g in dextrose 5 % 50 mL IVPB  Status:  Discontinued     1 g 100 mL/hr over 30 Minutes Intravenous Every 24 hours 03/04/13 0745 03/05/13 1026   03/03/13 0700  gentamicin (GARAMYCIN) 290 mg in dextrose 5 % 100 mL IVPB      290 mg 107.3 mL/hr over 60 Minutes Intravenous 60 min pre-op 03/02/13 1402 03/03/13 1130   03/03/13 0700  ampicillin (OMNIPEN) 2 g in sodium chloride 0.9 % 50 mL IVPB     2 g 150 mL/hr over 20 Minutes Intravenous 30 min pre-op 03/02/13 1405 03/03/13 1150   03/02/13 1349  ampicillin (OMNIPEN) 2 g in sodium chloride 0.9 % 50 mL IVPB  Status:  Discontinued     2 g 150 mL/hr over 20 Minutes Intravenous 30 min pre-op 03/02/13 1349 03/02/13 1404   03/02/13 1349  gentamicin (GARAMYCIN) 130 mg in dextrose 5 % 50 mL IVPB  Status:  Discontinued     1.5 mg/kg  83.9 kg 106.5 mL/hr over 30 Minutes Intravenous 30 min pre-op 03/02/13 1349 03/02/13 1356      Assessment: Patient with 10hr gentamicin level within the q24hr dosing range.  Goal of Therapy:  [place] nomogram dosing  Plan:  Follow up culture results Continue current gentamicin dosing  Aleene DavidsonGrimsley Jr, Chariti Havel Crowford 03/06/2013,5:09 AM

## 2013-03-06 NOTE — Progress Notes (Signed)
Urology Progress Note  Subjective:     Still with elevated temperature, but no high spiking fevers. Tachycardia improved. Has constipation type pain; no BM since Sunday. Indwelling ureter stent came out last night; right nephroureter stent still in place. Pain much better controlled.  ROS: Positive: urinary incontinence and right flank pain. Negative: chest pain or SOB  Objective:  Patient Vitals for the past 24 hrs:  BP Temp Temp src Pulse Resp SpO2  03/06/13 0600 145/87 mmHg 99.3 F (37.4 C) Oral 106 14 96 %  03/05/13 2055 142/82 mmHg 99 F (37.2 C) Oral 110 18 100 %  03/05/13 1500 130/35 mmHg 100.5 F (38.1 C) Oral 55 18 97 %  03/05/13 1405 - 100.5 F (38.1 C) - - - -  03/05/13 1213 - 100.7 F (38.2 C) - - - -    Physical Exam: General:  No acute distress, awake Cardiovascular:    [x]  S1/S2 present,mild tachycardia.  []  Irregularly irregular Chest:  CTA-B Abdomen:               [] Soft, appropriately TTP  [x] Soft, NTTP, right flank without ecchymosis. Right flank dressed w/ sterile dressing; right nephroureter tube in place.  [] Soft, appropriately TTP, incision(s) clean/dry/intact  Genitourinary: No foley.     I/O last 3 completed shifts: In: 3915.3 [P.O.:120; I.V.:3535; IV Piggyback:260.3] Out: 750 [Urine:750]  Recent Labs     03/05/13  0735  03/06/13  0430  HGB  11.2*  10.1*  WBC  16.4*  14.7*  PLT  283  292    Recent Labs     03/05/13  0735  03/06/13  0430  NA  137  135*  K  3.8  3.7  CL  102  100  CO2  22  21  BUN  8  11  CREATININE  1.01  1.14*  CALCIUM  8.8  8.3*  GFRNONAA  71*  61*  GFRAA  82*  71*     Recent Labs     03 /02/15  0725  INR  1.03  APTT  29     No components found with this basename: ABG,   Microbiology: Urine culture: no growth Blood cultures: negative to date  Length of stay: 3 days.  Assessment: Right staghorn renal stone. Fever (likely due to infected stone)  03/03/13- Right ureteroscopy   03/04/13- RIght  percutaneous renal access (nephroureteral tube) by IR.     Plan: -Transition to PO antibiotics- amoxacillin. -Transition to PO dilaudid. -Dulcolax suppository for constipation. -Still needs high volume of IV fluids. -Continue to monitor. -My office is working on rescheduling 1st stage of PCNL. -Disposition- earliest home would be tomorrow depending on how she is doing.   Natalia Leatherwoodaniel Marshae Azam, MD [phone]

## 2013-03-06 NOTE — Progress Notes (Addendum)
Pateint urinary stent came out during the night. Taped stent to ABD for MD to check. SRP, RN

## 2013-03-07 LAB — CBC
HEMATOCRIT: 28.1 % — AB (ref 36.0–46.0)
HEMOGLOBIN: 9.2 g/dL — AB (ref 12.0–15.0)
MCH: 24 pg — ABNORMAL LOW (ref 26.0–34.0)
MCHC: 32.7 g/dL (ref 30.0–36.0)
MCV: 73.2 fL — ABNORMAL LOW (ref 78.0–100.0)
Platelets: 349 10*3/uL (ref 150–400)
RBC: 3.84 MIL/uL — ABNORMAL LOW (ref 3.87–5.11)
RDW: 15.8 % — ABNORMAL HIGH (ref 11.5–15.5)
WBC: 12.7 10*3/uL — ABNORMAL HIGH (ref 4.0–10.5)

## 2013-03-07 LAB — BASIC METABOLIC PANEL
BUN: 9 mg/dL (ref 6–23)
CHLORIDE: 101 meq/L (ref 96–112)
CO2: 22 mEq/L (ref 19–32)
Calcium: 8.2 mg/dL — ABNORMAL LOW (ref 8.4–10.5)
Creatinine, Ser: 1.08 mg/dL (ref 0.50–1.10)
GFR calc Af Amer: 76 mL/min — ABNORMAL LOW (ref >90)
GFR, EST NON AFRICAN AMERICAN: 65 mL/min — AB (ref >90)
GLUCOSE: 112 mg/dL — AB (ref 70–99)
Potassium: 3.4 mEq/L — ABNORMAL LOW (ref 3.7–5.3)
Sodium: 135 mEq/L — ABNORMAL LOW (ref 137–147)

## 2013-03-07 MED ORDER — HYOSCYAMINE SULFATE 0.125 MG SL SUBL: 0.1250 mg | SUBLINGUAL_TABLET | SUBLINGUAL | Status: DC | PRN

## 2013-03-07 MED ORDER — BISACODYL 10 MG RE SUPP: 10.0000 mg | Freq: Two times a day (BID) | RECTAL | Status: DC

## 2013-03-07 MED ORDER — HYDROMORPHONE HCL 2 MG PO TABS: 2.0000 mg | ORAL_TABLET | ORAL | Status: DC | PRN

## 2013-03-07 MED ORDER — OXYBUTYNIN CHLORIDE ER 10 MG PO TB24: 10.0000 mg | ORAL_TABLET | Freq: Every day | ORAL | Status: DC

## 2013-03-07 MED ORDER — SENNOSIDES-DOCUSATE SODIUM 8.6-50 MG PO TABS: 1.0000 | ORAL_TABLET | Freq: Two times a day (BID) | ORAL | Status: DC

## 2013-03-07 MED ORDER — AMOXICILLIN-POT CLAVULANATE 875-125 MG PO TABS: 1.0000 | ORAL_TABLET | Freq: Two times a day (BID) | ORAL | Status: DC

## 2013-03-07 NOTE — Discharge Instructions (Signed)
DISCHARGE INSTRUCTIONS FOR PCNL   MEDICATIONS:  1.Resume all your other meds from home.  ACTIVITY 1. No strenuous activity, sexual activity, or lifting greater than 10 pounds for 3 weeks. 2. No driving while on narcotic pain medications 3. Drink plenty of water 4. Continue to walk at home - you can still get blood clots when you are at home, so keep active, but don't over do it. 5. May return to work in 1 week (but not heavy or strenuous activity).   BATHING 1. You can shower and we recommend daily showers.  Cover your wound with a dressing and remove the dressing immediately after the shower.  Do not submerge wound under water.  WOUND CARE  Do not dislodge the tube coming out of your back.  Change the dressing daily: place a dry gauze between your skin and the tube, and place a gauze over the tube. Do not place tape over the tube itself.    SIGNS/SYMPTOMS TO CALL: 1. Please call us if you have a fever greater than 101.5, uncontrolled nausea/vomiting, uncontrolled pain, dizziness, unable to urinate, chest pain, shortness of breath, leg swelling, leg pain, redness around wound, drainage from wound, or any other concerns or questions. 2. You can reach us at [phone].

## 2013-03-07 NOTE — Progress Notes (Signed)
Urology Progress Note  Subjective:     No fever over last 24 hours. Tachycardia continues to trend down. Starting to be able to control pain w/ PO meds, but still needs work. Positive flatus and BM today.    ROS: Positive: urinary incontinence and right flank pain. Negative: chest pain or SOB  Objective:  Patient Vitals for the past 24 hrs:  BP Temp Temp src Pulse Resp SpO2  03/07/13 0546 150/87 mmHg 100.4 F (38 C) Oral 100 18 97 %  03/06/13 2144 151/82 mmHg 100 F (37.8 C) Oral 107 18 100 %  03/06/13 1306 149/84 mmHg 98.4 F (36.9 C) Oral 109 16 98 %  03/06/13 1243 - 100.3 F (37.9 C) - - - -    Physical Exam: General:  No acute distress, awake Cardiovascular:    [x]   S1/S2 present,mild tachycardia.  []   Irregularly irregular Chest:  CTA-B Abdomen:               []  Soft, appropriately TTP  [x]  Soft, NTTP, right flank without ecchymosis. Right flank dressed w/ sterile dressing; right nephroureter tube in place.  []  Soft, appropriately TTP, incision(s) clean/dry/intact  Genitourinary: No foley.     I/O last 3 completed shifts: In: 4030 [P.O.:480; I.V.:3450; IV Piggyback:100] Out: 2100 [Urine:2100]  Recent Labs     03/06/13  0430  03/07/13  0420  HGB  10.1*  9.2*  WBC  14.7*  12.7*  PLT  292  349    Recent Labs     03/06/13  0430  03/07/13  0420  NA  135*  135*  K  3.7  3.4*  CL  100  101  CO2  21  22  BUN  11  9  CREATININE  1.14*  1.08  CALCIUM  8.3*  8.2*  GFRNONAA  61*  65*  GFRAA  71*  76*     No results found for this basename: PT, INR, APTT,  in the last 72 hours   No components found with this basename: ABG,   Microbiology: Urine culture: no growth Blood cultures: negative to date  Length of stay: 4 days.  Assessment: Right staghorn renal stone. Fever (likely due to infected stone)  03/03/13- Right ureteroscopy   03/04/13- RIght percutaneous renal access (nephroureteral tube) by IR.     Plan: -Dressing of nephrostomy tube  changed today. I have explained to patient how to change dressing.  -We reviewed discharge instructions. -Discharge Rx's printed and on chart.  -Change dilaudid 2-4 mg PO every 3 hours. -Not ready for discharge yet as need better pain control. Possible discharge home over the weekend.   Natalia Leatherwoodaniel Kannon Baum, MD [phone]

## 2013-03-08 LAB — CBC
HCT: 26.5 % — ABNORMAL LOW (ref 36.0–46.0)
Hemoglobin: 9 g/dL — ABNORMAL LOW (ref 12.0–15.0)
MCH: 24.7 pg — AB (ref 26.0–34.0)
MCHC: 34 g/dL (ref 30.0–36.0)
MCV: 72.6 fL — ABNORMAL LOW (ref 78.0–100.0)
PLATELETS: 369 10*3/uL (ref 150–400)
RBC: 3.65 MIL/uL — ABNORMAL LOW (ref 3.87–5.11)
RDW: 15.8 % — ABNORMAL HIGH (ref 11.5–15.5)
WBC: 9.9 10*3/uL (ref 4.0–10.5)

## 2013-03-08 LAB — BASIC METABOLIC PANEL
BUN: 9 mg/dL (ref 6–23)
CALCIUM: 8.5 mg/dL (ref 8.4–10.5)
CO2: 23 mEq/L (ref 19–32)
CREATININE: 0.96 mg/dL (ref 0.50–1.10)
Chloride: 104 mEq/L (ref 96–112)
GFR calc Af Amer: 87 mL/min — ABNORMAL LOW (ref >90)
GFR calc non Af Amer: 75 mL/min — ABNORMAL LOW (ref >90)
Glucose, Bld: 100 mg/dL — ABNORMAL HIGH (ref 70–99)
Potassium: 3.6 mEq/L — ABNORMAL LOW (ref 3.7–5.3)
Sodium: 137 mEq/L (ref 137–147)

## 2013-03-08 NOTE — Progress Notes (Addendum)
Urology Progress Note  Subjective:     No fever over last 48 hours after being switched to PO abx.  Pain improving with shortened interval of pain medicationbut still needs work.  Tachycardia improving.     Positive flatus and BM today.    ROS: Positive: urinary incontinence and right flank pain. Negative: chest pain or SOB  Objective:  Patient Vitals for the past 24 hrs:  BP Temp Temp src Pulse Resp SpO2  03/08/13 0414 157/87 mmHg 98 F (36.7 C) Oral 83 18 100 %  03/07/13 2011 146/84 mmHg 98.8 F (37.1 C) Oral 85 18 100 %  03/07/13 1859 - 98.9 F (37.2 C) Axillary - - -  03/07/13 1453 152/79 mmHg 100.3 F (37.9 C) Oral 106 20 98 %    Physical Exam: General:  No acute distress, awake Abdomen:               []  Soft, appropriately TTP  [x]  Soft, NTTP, right flank without ecchymosis. Right flank dressed w/ sterile dressing; right nephroureter tube in place.  []  Soft, appropriately TTP, incision(s) clean/dry/intact  Genitourinary: No foley.     I/O last 3 completed shifts: In: 6030 [P.O.:480; I.V.:5550] Out: -   Recent Labs     03/07/13  0420  03/08/13  0458  HGB  9.2*  9.0*  WBC  12.7*  9.9  PLT  349  369    Recent Labs     03/07/13  0420  03/08/13  0458  NA  135*  137  K  3.4*  3.6*  CL  101  104  CO2  22  23  BUN  9  9  CREATININE  1.08  0.96  CALCIUM  8.2*  8.5  GFRNONAA  65*  75*  GFRAA  76*  87*     No results found for this basename: PT, INR, APTT,  in the last 72 hours   No components found with this basename: ABG,   Microbiology: Urine culture: no growth Blood cultures: negative to date  Length of stay: 5 days.  Assessment: Right staghorn renal stone. Fever (likely due to infected stone) initally, improved with abx.  Tachycardia improving, pain poorly controlled.  03/03/13- Right ureteroscopy   03/04/13- RIght percutaneous renal access (nephroureteral tube) by IR.   Plan: -Dressing of nephrostomy tube changed today. I have explained  to patient how to change dressing.  -We reviewed discharge instructions. -Discharge Rx's printed and on chart.  -Change dilaudid 2-4 mg PO every 3 hours. -Not ready for discharge yet as need better pain control.  Hoping for discharge tomorrow after adjusting pain medication.

## 2013-03-09 LAB — CBC
HCT: 28.1 % — ABNORMAL LOW (ref 36.0–46.0)
Hemoglobin: 9.5 g/dL — ABNORMAL LOW (ref 12.0–15.0)
MCH: 24.4 pg — ABNORMAL LOW (ref 26.0–34.0)
MCHC: 33.8 g/dL (ref 30.0–36.0)
MCV: 72.1 fL — ABNORMAL LOW (ref 78.0–100.0)
PLATELETS: 449 10*3/uL — AB (ref 150–400)
RBC: 3.9 MIL/uL (ref 3.87–5.11)
RDW: 15.7 % — AB (ref 11.5–15.5)
WBC: 11.8 10*3/uL — AB (ref 4.0–10.5)

## 2013-03-09 LAB — BASIC METABOLIC PANEL
BUN: 6 mg/dL (ref 6–23)
CO2: 23 mEq/L (ref 19–32)
CREATININE: 0.73 mg/dL (ref 0.50–1.10)
Calcium: 8.6 mg/dL (ref 8.4–10.5)
Chloride: 99 mEq/L (ref 96–112)
GFR calc non Af Amer: >90 mL/min (ref >90)
Glucose, Bld: 100 mg/dL — ABNORMAL HIGH (ref 70–99)
POTASSIUM: 3.3 meq/L — AB (ref 3.7–5.3)
Sodium: 135 mEq/L — ABNORMAL LOW (ref 137–147)

## 2013-03-09 NOTE — Discharge Summary (Signed)
Date of admission: 03/03/2013  Date of discharge: 03/09/2013  Admission diagnosis: staghorn calculus  Discharge diagnosis: fever, tachycardia, staghorn calculus  Secondary diagnoses:  Patient Active Problem List   Diagnosis Date Noted  . 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 S/P repeat low transverse C-section 10/18/2012  . H/O unilateral salpingectomy 10/18/2012  . UTI (lower urinary tract infection) 04/01/2012  . Renal stone 04/01/2012  . Hydronephrosis 04/01/2012  . Pregnancy 04/01/2012    History and Physical: For full details, please see admission history and physical. Briefly, [name] is a 38 y.o. year old patient with history of staghorn calculus.  She presented for PCNL.   Hospital Course:  Patient presented for PCNL, however, Dr. Fredia Sorrow with interventional radiology attempted to place right-sided percutaneous access today, but due to the size of the stone there was no room in the collecting system to allow for safe access passage of a guidewire down to the bladder.  She was taken to the OR for ureterscopy/laser lithotripsy.  The next day she had a successful placement of a neph tube.  She became febrile and tachycardic.  She was placed on broad spectrum abx and narrowed to augmentin with sensativities.  She deffervesced and her HR normalized.  Her pain was controlled with oral pain medications.  On HD #7 she was deemed safe for discharge.  Plan to perform a PCNL in 1-2 weeks.   Laboratory values:   Recent Labs  03/07/13 0420 03/08/13 0458 03/09/13 0527  WBC 12.7* 9.9 11.8*  HGB 9.2* 9.0* 9.5*  HCT 28.1* 26.5* 28.1*    Recent Labs  03/07/13 0420 03/08/13 0458 03/09/13 0527  NA 135* 137 135*  K 3.4* 3.6* 3.3*  CL 101 104 99  CO2 22 23 23   GLUCOSE 112* 100* 100*  BUN 9 9 6   CREATININE 1.08 0.96 0.73  CALCIUM 8.2* 8.5 8.6   No results found for this basename: LABPT, INR,  in the last 72 hours No results found for this basename: LABURIN,  in the last 72  hours Results for orders placed during the hospital encounter of 03/03/13  CULTURE, BLOOD (ROUTINE X 2)     Status: None   Collection Time    03/04/13  2:37 AM      Result Value Ref Range Status   Specimen Description BLOOD LEFT ARM   Final   Special Requests BOTTLES DRAWN AEROBIC AND ANAEROBIC 5CC   Final   Culture  Setup Time     Final   Value: 03/04/2013 08:57     Performed at [facility]   Culture     Final   Value:        BLOOD CULTURE RECEIVED NO GROWTH TO DATE CULTURE WILL BE HELD FOR 5 DAYS BEFORE ISSUING A FINAL NEGATIVE REPORT     Performed at [facility]   Report Status PENDING   Incomplete  CULTURE, BLOOD (ROUTINE X 2)     Status: None   Collection Time    03/04/13  2:42 AM      Result Value Ref Range Status   Specimen Description BLOOD LEFT ARM   Final   Special Requests BOTTLES DRAWN AEROBIC AND ANAEROBIC 5CC    Final   Culture  Setup Time     Final   Value: 03/04/2013 08:56     Performed at [facility]   Culture     Final   Value:        BLOOD CULTURE RECEIVED  NO GROWTH TO DATE CULTURE WILL BE HELD FOR 5 DAYS BEFORE ISSUING A FINAL NEGATIVE REPORT     [facility]   Report Status PENDING   Incomplete  URINE CULTURE     Status: None   Collection Time    03/04/13  4:23 AM      Result Value Ref Range Status   Specimen Description URINE, CLEAN CATCH   Final   Special Requests NONE   Final   Culture  Setup Time     Final   Value: 03/04/2013 08:59     [facility]   [place] Count     Final   Value: NO GROWTH     [facility]   Culture     Final   Value: NO GROWTH     [facility]   Report Status 03/05/2013 FINAL   Final    Disposition: Home  Discharge instruction: The patient was instructed to be ambulatory but told to refrain from heavy lifting, strenuous activity, or driving.   Discharge medications:    Medication List    STOP taking these  medications       acetaminophen 500 MG tablet  Commonly known as:  TYLENOL     ibuprofen 200 MG tablet  Commonly known as:  ADVIL,MOTRIN      TAKE these medications       amoxicillin-clavulanate 875-125 MG per tablet  Commonly known as:  AUGMENTIN  Take 1 tablet by mouth every 12 (twelve) hours.     bisacodyl 10 MG suppository  Commonly known as:  DULCOLAX  Place 1 suppository (10 mg total) rectally 2 (two) times daily.     HYDROmorphone 2 MG tablet  Commonly known as:  DILAUDID  Take 1-2 tablets (2-4 mg total) by mouth every 3 (three) hours as needed for severe pain.     hyoscyamine 0.125 MG SL tablet  Commonly known as:  LEVSIN SL  Place 1 tablet (0.125 mg total) under the tongue every 4 (four) hours as needed (bladder spasms).     oxybutynin 10 MG 24 hr tablet  Commonly known as:  DITROPAN-XL  Take 1 tablet (10 mg total) by mouth at bedtime.     senna-docusate 8.6-50 MG per tablet  Commonly known as:  Senokot-S  Take 1 tablet by mouth 2 (two) times daily.        Followup:      Follow-up Information   Follow up with Milford CageWoodruff, Daniel Young, MD. (Surgery will be 03/18/13.)    Specialty:  Urology   Contact information:   [address] [facility]  [place] [address] [phone]

## 2013-03-09 NOTE — Progress Notes (Signed)
Pt left hospital with her "friend".  Alert and oriented. Discharge instructions/prescriptions given/explained with pt verbalizing understanding.  Dressing supplies given for right back dressing. Followup appointment noted.

## 2013-03-09 NOTE — Progress Notes (Signed)
Urology Progress Note  Subjective:     No issues in past 24 hrs. No fevers Pain better controlled Tachycardia resolved Moving bowels   ROS: Positive: urinary incontinence and right flank pain. Negative: chest pain or SOB  Objective:  Patient Vitals for the past 24 hrs:  BP Temp Temp src Pulse Resp SpO2  03/09/13 0604 144/89 mmHg 99.2 F (37.3 C) Oral 75 18 100 %  03/08/13 2227 152/82 mmHg 98.9 F (37.2 C) Oral 81 18 100 %  03/08/13 1359 144/75 mmHg 99.5 F (37.5 C) Oral 88 18 100 %    Physical Exam: General:  No acute distress, awake Abdomen:               []  Soft, appropriately TTP  [x]  Soft, NTTP, right flank without ecchymosis. Right flank dressed w/ sterile dressing; right nephroureter tube in place.  []  Soft, appropriately TTP, incision(s) clean/dry/intact  Genitourinary: No foley.     I/O last 3 completed shifts: In: 4757.5 [P.O.:760; I.V.:3997.5] Out: 1750 [Urine:1750]  Recent Labs     03/08/13  0458  03/09/13  0527  HGB  9.0*  9.5*  WBC  9.9  11.8*  PLT  369  449*    Recent Labs     03/08/13  0458  03/09/13  0527  NA  137  135*  K  3.6*  3.3*  CL  104  99  CO2  23  23  BUN  9  6  CREATININE  0.96  0.73  CALCIUM  8.5  8.6  GFRNONAA  75*  >90  GFRAA  87*  >90     No results found for this basename: PT, INR, APTT,  in the last 72 hours   No components found with this basename: ABG,   Microbiology: Urine culture: no growth Blood cultures: negative to date  Length of stay: 6 days.  Assessment: Right staghorn renal stone. Fever (likely due to infected stone) initally, improved with abx.  Tachycardia improving, pain poorly controlled.  03/03/13- Right ureteroscopy   03/04/13- RIght percutaneous renal access (nephroureteral tube) by IR.   Plan: D/c home Follow up scheduled for planned PCNL.

## 2013-03-10 LAB — CULTURE, BLOOD (ROUTINE X 2)
CULTURE: NO GROWTH
Culture: NO GROWTH

## 2013-03-11 ENCOUNTER — Encounter (HOSPITAL_COMMUNITY): Payer: Self-pay | Admitting: Emergency Medicine

## 2013-03-11 ENCOUNTER — Emergency Department (HOSPITAL_COMMUNITY)
Admission: EM | Admit: 2013-03-11 | Discharge: 2013-03-11 | Disposition: A | Discharge status: Home / Self Care | Payer: Medicaid Other | Attending: Emergency Medicine | Admitting: Emergency Medicine

## 2013-03-11 DIAGNOSIS — F17200 Nicotine dependence, unspecified, uncomplicated: Secondary | ICD-10-CM | POA: Insufficient documentation

## 2013-03-11 DIAGNOSIS — H4600 Optic papillitis, unspecified eye: Secondary | ICD-10-CM

## 2013-03-11 DIAGNOSIS — Z872 Personal history of diseases of the skin and subcutaneous tissue: Secondary | ICD-10-CM | POA: Insufficient documentation

## 2013-03-11 DIAGNOSIS — K140 Glossitis: Secondary | ICD-10-CM | POA: Insufficient documentation

## 2013-03-11 DIAGNOSIS — Z8679 Personal history of other diseases of the circulatory system: Secondary | ICD-10-CM | POA: Insufficient documentation

## 2013-03-11 DIAGNOSIS — Z87442 Personal history of urinary calculi: Secondary | ICD-10-CM | POA: Insufficient documentation

## 2013-03-11 DIAGNOSIS — Z8739 Personal history of other diseases of the musculoskeletal system and connective tissue: Secondary | ICD-10-CM | POA: Insufficient documentation

## 2013-03-11 DIAGNOSIS — Z79899 Other long term (current) drug therapy: Secondary | ICD-10-CM | POA: Insufficient documentation

## 2013-03-11 MED ORDER — MAGIC MOUTHWASH W/LIDOCAINE: 5.0000 mL | Freq: Three times a day (TID) | ORAL | Status: DC | PRN

## 2013-03-11 MED ORDER — MAGIC MOUTHWASH W/LIDOCAINE
5.0000 mL | Freq: Once | ORAL | Status: AC
  Administered 2013-03-11: 5 mL via ORAL
  Filled 2013-03-11: qty 5

## 2013-03-11 NOTE — Progress Notes (Signed)
   CARE MANAGEMENT ED NOTE 03/11/2013  Patient:  [name],[name]   Account Number:  [id number]  Date Initiated:  03/11/2013  Documentation initiated by:  Radford PaxFERRERO,Romaldo Saville  Subjective/Objective Assessment:   Patient presents to Ed with bumps on her tongue and difficulty swallowing.     Subjective/Objective Assessment Detail:     Action/Plan:   Action/Plan Detail:   Anticipated DC Date:  03/11/2013     Status Recommendation to Physician:   Result of Recommendation:    Other ED Services  Consult Working Plan    DC Planning Services  Other  PCP issues    Choice offered to / List presented to:            Status of service:  Completed, signed off  ED Comments:   ED Comments Detail:  Patient confirms she has Medicaid insurnace without a pcp. EDCM provided patient with alist of pcps who accept Medicaid in [place].  Patient thankful for resources.

## 2013-03-11 NOTE — ED Provider Notes (Signed)
CSN: [id number]     Arrival date & time 03/11/13  1652 History   First MD Initiated Contact with Patient 03/11/13 1726     Chief Complaint  Patient presents with  . bumps on tongue   . trouble swallowing      (Consider location/radiation/quality/duration/timing/severity/associated sxs/prior Treatment) Patient is a 38 y.o. female presenting with mouth sores. The history is provided by the patient. No language interpreter was used.  Mouth Lesions Location:  Tongue Quality:  Red and blistered Onset quality:  Gradual Duration:  3 days Ineffective treatments:  None tried Associated symptoms: no fever, no rash and no swollen glands    Pt is a 38 year old female who presents with blisters on her tongue. She reports that she has had some discomfort and trouble swallowing since her hospital admission. She was discharged on Sunday and reports that she is having difficulty swallowing due to the lesions in her mouth. She denies any other skin disruptions or rash. She denies fever, chills or N/V/D. She reports that she is recovering ok otherwise.   Past Medical History  Diagnosis Date  . Ectopic pregnancy   . Hypertension     hx pre eclampsia with last preg.  Marland Kitchen. Shortness of breath     occasionally with exertion   . Tendonitis     rt hand  . Eczema   . Kidney stone    Past Surgical History  Procedure Laterality Date  . Ectopic pregnancy surgery  2005  . Cesarean section with bilateral tubal ligation Right 10/18/2012    Procedure: REPEAT CESAREAN SECTION WITH BILATERAL TUBAL LIGATION;  Surgeon: Oliver PilaKathy W Richardson, MD;  Location: [facility];  Service: Obstetrics;  Laterality: Right; TOTAL OF 4 C SECTIONS  . Nephrolithotomy Right 03/03/2013    Procedure: Cystoscopy, Right Ureteroscopy with holmium laser With Strent;  Surgeon: Milford Cageaniel Young Woodruff, MD;  Location: [facility];  Service: Urology;  Laterality: Right;  . Holmium laser application Right 03/03/2013    Procedure: HOLMIUM LASER APPLICATION;   Surgeon: Milford Cageaniel Young Woodruff, MD;  Location: [facility];  Service: Urology;  Laterality: Right;   Family History  Problem Relation Age of Onset  . Diabetes type II Mother   . Hypertension Mother   . Diabetes type II Sister    History  Substance Use Topics  . Smoking status: Light Tobacco Smoker -- 10 years  . Smokeless tobacco: Not on file     Comment: patient states she only smokes 1 cigarette a day  . Alcohol Use: Yes     Comment: occasional   OB History   Grav Para Term Preterm Abortions TAB SAB Ect Mult Living   5 4 4  0 1 0 0 1  4     Review of Systems  Constitutional: Negative for fever and chills.  HENT: Positive for mouth sores.   Respiratory: Negative for cough, shortness of breath and wheezing.   Cardiovascular: Negative for chest pain.  Skin: Negative for rash.  All other systems reviewed and are negative.      Allergies  Review of patient's allergies indicates no known allergies.  Home Medications   Current Outpatient Rx  Name  Route  Sig  Dispense  Refill  . amoxicillin-clavulanate (AUGMENTIN) 875-125 MG per tablet   Oral   Take 1 tablet by mouth every 12 (twelve) hours.   20 tablet   0   . bisacodyl (DULCOLAX) 10 MG suppository   Rectal   Place 1 suppository (10 mg total)  rectally 2 (two) times daily.   12 suppository   3   . HYDROmorphone (DILAUDID) 2 MG tablet   Oral   Take 1-2 tablets (2-4 mg total) by mouth every 3 (three) hours as needed for severe pain.   100 tablet   0   . hyoscyamine (LEVSIN SL) 0.125 MG SL tablet   Sublingual   Place 1 tablet (0.125 mg total) under the tongue every 4 (four) hours as needed (bladder spasms).   40 tablet   4   . oxybutynin (DITROPAN-XL) 10 MG 24 hr tablet   Oral   Take 1 tablet (10 mg total) by mouth at bedtime.   30 tablet   0   . senna-docusate (SENOKOT-S) 8.6-50 MG per tablet   Oral   Take 1 tablet by mouth 2 (two) times daily.   60 tablet   3    BP 133/83  Pulse 85  Temp(Src) 98.3 F  (36.8 C) (Oral)  Resp 17  SpO2 100%  LMP 03/03/2013 Physical Exam  Nursing note and vitals reviewed. Constitutional: She is oriented to person, place, and time. She appears well-developed and well-nourished. No distress.  Well-appearing  HENT:  Head: Normocephalic and atraumatic.  Right Ear: External ear normal.  Left Ear: External ear normal.  Mouth/Throat: Uvula is midline. Oral lesions present.  Raised, blister-like lesions on tongue.   Eyes: Conjunctivae and EOM are normal.  Neck: Normal range of motion. Neck supple. No JVD present. No tracheal deviation present. No thyromegaly present.  Cardiovascular: Normal rate, regular rhythm, normal heart sounds and intact distal pulses.   Pulmonary/Chest: Effort normal and breath sounds normal. No respiratory distress. She has no wheezes.  Abdominal: Soft. Bowel sounds are normal. She exhibits no distension. There is no tenderness.  Musculoskeletal: Normal range of motion.  Lymphadenopathy:    She has no cervical adenopathy.  Neurological: She is alert and oriented to person, place, and time.  Skin: Skin is warm and dry. No rash noted.  Psychiatric: She has a normal mood and affect. Her behavior is normal. Judgment and thought content normal.    ED Course  Procedures (including critical care time) Labs Review Labs Reviewed - No data to display Imaging Review No results found.   EKG Interpretation None      MDM   Final diagnoses:  Papillitis   Afebrile. Well-appearing. Recent hospitalization for kidney stone. Blisters on tongue, papillitis. Relief from magic mouthwash here. Discussed plan with pt and she agrees. Prescription for magic mouthwash given. Return if no gradual improvement or if symptoms worsen.       Irish Elders, NP 03/11/13 1902

## 2013-03-11 NOTE — ED Notes (Signed)
Pt states she was discharged from here on Sunday where she had surgery. Pt states that after she was extubated and switched from Morphine to Dilaudid she has bumps on her tongue that were first painful but not so much anymore, just a nuisance and has trouble eating and swallowing causing her to have decrease in appetite.  Pt states that she kept telling the staff while in hospital but no one would address her concern.

## 2013-03-11 NOTE — Discharge Instructions (Signed)
Oral Ulcers Oral ulcers are painful, shallow sores around the lining of the mouth. They can affect the gums, the inside of the lips and the cheeks (sores on the outside of the lips and on the face are different). They typically first occur in school aged children and teenagers. Oral ulcers may also be called canker sores or cold sores. CAUSES  Canker sores and cold sores can be caused by many factors including:  Infection.  Injury.  Sun exposure.  Medications.  Emotional stress.  Food allergies.  Vitamin deficiencies.  Toothpastes containing sodium lauryl sulfate. The Herpes Virus can be the cause of mouth ulcers. The first infection can be severe and cause 10 or more ulcers on the gums, tongue and lips with fever and difficulty in swallowing. This infection usually occurs between the ages of 1 and 3 years.  SYMPTOMS  The typical sore is about  inch (6 mm) in size, is an oval or round ulcer with red borders. DIAGNOSIS  Your caregiver can diagnose simple oral ulcers by examination. Additional testing is usually not required.  TREATMENT  Treatment is aimed at pain relief. Generally, oral ulcers resolve by themselves within 1 to 2 weeks without medication and are not contagious unless caused by Herpes (and other viruses). Antibiotics are not effective with mouth sores. Avoid direct contact with others until the ulcer is completely healed. See your caregiver for follow-up care as recommended. Also:  Offer a soft diet.  Encourage plenty of fluids to prevent dehydration. Popsicles and milk shakes can be helpful.  Avoid acidic and salty foods and drinks such as orange juice.  Infants and young children will often refuse to drink because of pain. Using a teaspoon, cup or syringe to give small amounts of fluids frequently can help prevent dehydration.  Cold compresses on the face may help reduce pain.  Pain medication can help control soreness.  A solution of diphenhydramine mixed  with a liquid antacid can be useful to decrease the soreness of ulcers. Consult a caregiver for the dosing.  Liquids or ointments with a numbing ingredient may be helpful when used as recommended.  Older children and teenagers can rinse their mouth with a salt-water mixture (1/2 teaspoonof salt in 8 ounces of water) four times a day. This treatment is uncomfortable but may reduce the time the ulcers are present.  There are many over the counter throat lozenges and medications available for oral ulcers. There effectiveness has not been studied.  Consult your medical caregiver prior to using homeopathic treatments for oral ulcers. SEEK MEDICAL CARE IF:   You think your child needs to be seen.  The pain worsens and you cannot control it.  There are 4 or more ulcers.  The lips and gums begin to bleed and crust.  A single mouth ulcer is near a tooth that is causing a toothache or pain.  Your child has a fever, swollen face, or swollen glands.  The ulcers began after starting a medication.  Mouth ulcers keep re-occurring or last more than 2 weeks.  You think your child is not taking adequate fluids. SEEK IMMEDIATE MEDICAL CARE IF:   Your child has a high fever.  Your child is unable to swallow or becomes dehydrated.  Your child looks or acts very ill.  An ulcer caused by a chemical your child accidentally put in their mouth. Document Released: 01/27/2004 Document Revised: 03/13/2011 Document Reviewed: 09/10/2008 [facility] Patient Information 2014 [place], [place].    Use magic mouth wash  three times a day before meals Return if symptoms worsen

## 2013-03-13 ENCOUNTER — Other Ambulatory Visit (HOSPITAL_COMMUNITY): Payer: Self-pay | Admitting: Urology

## 2013-03-13 NOTE — Patient Instructions (Addendum)
    20 [name] BloodM Gropp  03/17/2013   Your procedure is scheduled on: 3-17  -2015  Report to [facility] at     0815   AM .  Call this number if you have problems the morning of surgery: [phone]  Or Presurgical Testing [phone]([name]) For Living Will and/or Health Care Power Attorney Forms: please provide copy for your medical record,may bring AM of surgery(Forms should be already notarized -we do not provide this service).(03-17-13 Desires no further information at this time)     Do not eat food:After Midnight.    Take these medicines the morning of surgery with A SIP OF WATER: antibiotic as usual.   Do not wear jewelry, make-up or nail polish.  Do not wear lotions, powders, or perfumes. You may wear deodorant.  Do not shave 48 hours(2 days) prior to first CHG shower(legs and under arms).(Shaving face and neck okay.)  Do not bring valuables to the hospital.(Hospital is not responsible for lost valuables).  Contacts, dentures or removable bridgework, body piercing, hair pins may not be worn into surgery.  Leave suitcase in the car. After surgery it may be brought to your room.  For patients admitted to the hospital, checkout time is 11:00 AM the day of discharge.(Restricted visitors-Any Persons displaying flu-like symptoms or illness).    Patients discharged the day of surgery will not be allowed to drive home. Must have responsible person with you x 24 hours once discharged.  Name and phone number of your driver: will arrange  Special Instructions: CHG(Chlorhedine 4%-"Hibiclens","Betasept","Aplicare") Shower Use Special Wash: see special instructions.(avoid face and genitals)   Please read over the following fact sheets that you were given:Transfusion fact sheet, Incentive Spirometry Instruction.  Remember : Type/Screen "Blue armbands" - may not be removed once applied(would result in being retested AM of surgery, if removed).  Failure to follow these  instructions may result in Cancellation of your surgery.   Patient signature_______________________________________________________

## 2013-03-17 ENCOUNTER — Encounter (HOSPITAL_COMMUNITY): Payer: Self-pay

## 2013-03-17 ENCOUNTER — Encounter (HOSPITAL_COMMUNITY)
Admission: RE | Admit: 2013-03-17 | Discharge: 2013-03-17 | Disposition: A | Discharge status: Home / Self Care | Payer: Medicaid Other | Source: Ambulatory Visit | Attending: Urology | Admitting: Urology

## 2013-03-17 LAB — CBC
HCT: 37.9 % (ref 36.0–46.0)
Hemoglobin: 12.3 g/dL (ref 12.0–15.0)
MCH: 24.1 pg — AB (ref 26.0–34.0)
MCHC: 32.5 g/dL (ref 30.0–36.0)
MCV: 74.3 fL — ABNORMAL LOW (ref 78.0–100.0)
PLATELETS: 700 10*3/uL — AB (ref 150–400)
RBC: 5.1 MIL/uL (ref 3.87–5.11)
RDW: 15.6 % — AB (ref 11.5–15.5)
WBC: 6 10*3/uL (ref 4.0–10.5)

## 2013-03-17 LAB — BASIC METABOLIC PANEL
BUN: 9 mg/dL (ref 6–23)
CALCIUM: 9.9 mg/dL (ref 8.4–10.5)
CO2: 23 mEq/L (ref 19–32)
CREATININE: 0.85 mg/dL (ref 0.50–1.10)
Chloride: 99 mEq/L (ref 96–112)
GFR calc Af Amer: >90 mL/min (ref >90)
GFR calc non Af Amer: 87 mL/min — ABNORMAL LOW (ref >90)
GLUCOSE: 124 mg/dL — AB (ref 70–99)
Potassium: 3.3 mEq/L — ABNORMAL LOW (ref 3.7–5.3)
Sodium: 138 mEq/L (ref 137–147)

## 2013-03-17 LAB — HCG, SERUM, QUALITATIVE: PREG SERUM: NEGATIVE

## 2013-03-17 MED ORDER — DEXTROSE 5 % IV SOLN
300.0000 mg | INTRAVENOUS | Status: AC
  Administered 2013-03-18: 300 mg via INTRAVENOUS
  Filled 2013-03-17: qty 7.5

## 2013-03-17 MED ORDER — SODIUM CHLORIDE 0.9 % IV SOLN
2.0000 g | INTRAVENOUS | Status: AC
  Administered 2013-03-18: 2 g via INTRAVENOUS
  Filled 2013-03-17: qty 2000

## 2013-03-17 NOTE — Progress Notes (Signed)
03-17-13 1445 Labs viewable in [facility].

## 2013-03-17 NOTE — Pre-Procedure Instructions (Addendum)
03-17-13 CT abd/pelvis 03-04-13 Epic. 03-17-13 1445 Note via Epic-labs viewable in Epic.W. Kennon PortelaHendrick,RN

## 2013-03-18 ENCOUNTER — Inpatient Hospital Stay (HOSPITAL_COMMUNITY)
Admission: RE | Admit: 2013-03-18 | Discharge: 2013-03-22 | DRG: 661 | Disposition: A | Discharge status: Home Health | Payer: Medicaid Other | Source: Ambulatory Visit | Attending: Urology | Admitting: Urology

## 2013-03-18 ENCOUNTER — Encounter (HOSPITAL_COMMUNITY): Payer: Self-pay | Admitting: *Deleted

## 2013-03-18 ENCOUNTER — Encounter (HOSPITAL_COMMUNITY)
Admission: RE | Disposition: A | Discharge status: Home Health | Payer: Self-pay | Source: Ambulatory Visit | Attending: Urology

## 2013-03-18 ENCOUNTER — Inpatient Hospital Stay (HOSPITAL_COMMUNITY): Payer: Medicaid Other

## 2013-03-18 ENCOUNTER — Encounter (HOSPITAL_COMMUNITY): Payer: Medicaid Other | Admitting: *Deleted

## 2013-03-18 ENCOUNTER — Inpatient Hospital Stay (HOSPITAL_COMMUNITY): Payer: Medicaid Other | Admitting: *Deleted

## 2013-03-18 DIAGNOSIS — N200 Calculus of kidney: Principal | ICD-10-CM | POA: Diagnosis present

## 2013-03-18 DIAGNOSIS — Z833 Family history of diabetes mellitus: Secondary | ICD-10-CM

## 2013-03-18 DIAGNOSIS — F17200 Nicotine dependence, unspecified, uncomplicated: Secondary | ICD-10-CM | POA: Diagnosis present

## 2013-03-18 DIAGNOSIS — Z8249 Family history of ischemic heart disease and other diseases of the circulatory system: Secondary | ICD-10-CM

## 2013-03-18 DIAGNOSIS — Z79899 Other long term (current) drug therapy: Secondary | ICD-10-CM

## 2013-03-18 DIAGNOSIS — I10 Essential (primary) hypertension: Secondary | ICD-10-CM | POA: Diagnosis present

## 2013-03-18 HISTORY — PX: NEPHROLITHOTOMY: SHX5134

## 2013-03-18 HISTORY — PX: HOLMIUM LASER APPLICATION: SHX5852

## 2013-03-18 LAB — TYPE AND SCREEN
ABO/RH(D): B POS
Antibody Screen: NEGATIVE

## 2013-03-18 LAB — HEMOGLOBIN AND HEMATOCRIT, BLOOD
HCT: 31.6 % — ABNORMAL LOW (ref 36.0–46.0)
Hemoglobin: 10.6 g/dL — ABNORMAL LOW (ref 12.0–15.0)

## 2013-03-18 SURGERY — NEPHROLITHOTOMY PERCUTANEOUS
Anesthesia: General | Site: Flank | Laterality: Right

## 2013-03-18 MED ORDER — LACTATED RINGERS IV SOLN: INTRAVENOUS | Status: DC

## 2013-03-18 MED ORDER — CISATRACURIUM BESYLATE (PF) 10 MG/5ML IV SOLN
INTRAVENOUS | Status: DC | PRN
  Administered 2013-03-18: 1 mg via INTRAVENOUS
  Administered 2013-03-18: 2 mg via INTRAVENOUS
  Administered 2013-03-18: 1 mg via INTRAVENOUS

## 2013-03-18 MED ORDER — FENTANYL CITRATE 0.05 MG/ML IJ SOLN
INTRAMUSCULAR | Status: AC
  Filled 2013-03-18: qty 5

## 2013-03-18 MED ORDER — METOCLOPRAMIDE HCL 5 MG/ML IJ SOLN
INTRAMUSCULAR | Status: DC | PRN
  Administered 2013-03-18: 5 mg via INTRAVENOUS

## 2013-03-18 MED ORDER — HYDROMORPHONE HCL PF 1 MG/ML IJ SOLN
0.5000 mg | INTRAMUSCULAR | Status: DC | PRN
  Administered 2013-03-19 – 2013-03-22 (×19): 1 mg via INTRAVENOUS
  Filled 2013-03-18 (×19): qty 1

## 2013-03-18 MED ORDER — GLYCOPYRROLATE 0.2 MG/ML IJ SOLN
INTRAMUSCULAR | Status: AC
  Filled 2013-03-18: qty 3

## 2013-03-18 MED ORDER — FENTANYL CITRATE 0.05 MG/ML IJ SOLN
INTRAMUSCULAR | Status: DC | PRN
  Administered 2013-03-18: 25 ug via INTRAVENOUS
  Administered 2013-03-18: 75 ug via INTRAVENOUS
  Administered 2013-03-18: 100 ug via INTRAVENOUS
  Administered 2013-03-18 (×3): 50 ug via INTRAVENOUS

## 2013-03-18 MED ORDER — MIDAZOLAM HCL 5 MG/5ML IJ SOLN
INTRAMUSCULAR | Status: DC | PRN
  Administered 2013-03-18 (×2): 1 mg via INTRAVENOUS

## 2013-03-18 MED ORDER — ONDANSETRON HCL 4 MG/2ML IJ SOLN
INTRAMUSCULAR | Status: DC | PRN
  Administered 2013-03-18: 4 mg via INTRAVENOUS

## 2013-03-18 MED ORDER — DEXAMETHASONE SODIUM PHOSPHATE 10 MG/ML IJ SOLN
INTRAMUSCULAR | Status: DC | PRN
  Administered 2013-03-18: 5 mg via INTRAVENOUS

## 2013-03-18 MED ORDER — SODIUM CHLORIDE 0.9 % IV SOLN
1.0000 g | Freq: Four times a day (QID) | INTRAVENOUS | Status: AC
  Administered 2013-03-18 – 2013-03-19 (×3): 1 g via INTRAVENOUS
  Filled 2013-03-18 (×4): qty 1000

## 2013-03-18 MED ORDER — MIDAZOLAM HCL 2 MG/2ML IJ SOLN
INTRAMUSCULAR | Status: AC
  Filled 2013-03-18: qty 2

## 2013-03-18 MED ORDER — HYDROMORPHONE HCL PF 1 MG/ML IJ SOLN: 0.2500 mg | INTRAMUSCULAR | Status: DC | PRN

## 2013-03-18 MED ORDER — HYDROMORPHONE HCL PF 1 MG/ML IJ SOLN
INTRAMUSCULAR | Status: DC | PRN
  Administered 2013-03-18 (×4): 0.5 mg via INTRAVENOUS

## 2013-03-18 MED ORDER — KETOROLAC TROMETHAMINE 30 MG/ML IJ SOLN
INTRAMUSCULAR | Status: AC
  Filled 2013-03-18: qty 1

## 2013-03-18 MED ORDER — NEOSTIGMINE METHYLSULFATE 1 MG/ML IJ SOLN
INTRAMUSCULAR | Status: AC
  Filled 2013-03-18: qty 10

## 2013-03-18 MED ORDER — LACTATED RINGERS IV SOLN
INTRAVENOUS | Status: DC
  Administered 2013-03-18: 1000 mL via INTRAVENOUS
  Administered 2013-03-18: 12:00:00 via INTRAVENOUS

## 2013-03-18 MED ORDER — SODIUM CHLORIDE 0.9 % IJ SOLN
INTRAMUSCULAR | Status: AC
  Filled 2013-03-18: qty 10

## 2013-03-18 MED ORDER — OXYBUTYNIN CHLORIDE ER 10 MG PO TB24
10.0000 mg | ORAL_TABLET | Freq: Every day | ORAL | Status: DC
  Administered 2013-03-18 – 2013-03-21 (×4): 10 mg via ORAL
  Filled 2013-03-18 (×6): qty 1

## 2013-03-18 MED ORDER — LIP MEDEX EX OINT
TOPICAL_OINTMENT | CUTANEOUS | Status: DC | PRN
  Filled 2013-03-18: qty 7

## 2013-03-18 MED ORDER — GLYCOPYRROLATE 0.2 MG/ML IJ SOLN
INTRAMUSCULAR | Status: DC | PRN
  Administered 2013-03-18: .6 mg via INTRAVENOUS

## 2013-03-18 MED ORDER — SODIUM CHLORIDE 0.9 % IV SOLN
INTRAVENOUS | Status: DC
  Administered 2013-03-18 – 2013-03-22 (×14): via INTRAVENOUS

## 2013-03-18 MED ORDER — SENNOSIDES-DOCUSATE SODIUM 8.6-50 MG PO TABS
1.0000 | ORAL_TABLET | Freq: Two times a day (BID) | ORAL | Status: DC
  Administered 2013-03-18 – 2013-03-22 (×7): 1 via ORAL
  Filled 2013-03-18 (×11): qty 1

## 2013-03-18 MED ORDER — EPHEDRINE SULFATE 50 MG/ML IJ SOLN
INTRAMUSCULAR | Status: AC
  Filled 2013-03-18: qty 1

## 2013-03-18 MED ORDER — ONDANSETRON HCL 4 MG/2ML IJ SOLN: 4.0000 mg | INTRAMUSCULAR | Status: DC | PRN

## 2013-03-18 MED ORDER — NEOSTIGMINE METHYLSULFATE 1 MG/ML IJ SOLN
INTRAMUSCULAR | Status: DC | PRN
  Administered 2013-03-18: 4 mg via INTRAVENOUS

## 2013-03-18 MED ORDER — SODIUM CHLORIDE 0.9 % IR SOLN
Status: DC | PRN
  Administered 2013-03-18: 12000 mL

## 2013-03-18 MED ORDER — HYDROMORPHONE HCL 2 MG PO TABS
2.0000 mg | ORAL_TABLET | ORAL | Status: DC | PRN
  Administered 2013-03-18 – 2013-03-22 (×8): 4 mg via ORAL
  Filled 2013-03-18 (×8): qty 2

## 2013-03-18 MED ORDER — FENTANYL CITRATE 0.05 MG/ML IJ SOLN
INTRAMUSCULAR | Status: AC
  Filled 2013-03-18: qty 2

## 2013-03-18 MED ORDER — GENTAMICIN SULFATE 40 MG/ML IJ SOLN
300.0000 mg | INTRAVENOUS | Status: DC
  Administered 2013-03-19 – 2013-03-21 (×3): 300 mg via INTRAVENOUS
  Filled 2013-03-18 (×5): qty 7.5

## 2013-03-18 MED ORDER — ROCURONIUM BROMIDE 100 MG/10ML IV SOLN
INTRAVENOUS | Status: AC
  Filled 2013-03-18: qty 1

## 2013-03-18 MED ORDER — BUPIVACAINE HCL (PF) 0.25 % IJ SOLN
INTRAMUSCULAR | Status: AC
  Filled 2013-03-18: qty 30

## 2013-03-18 MED ORDER — LIDOCAINE HCL (CARDIAC) 20 MG/ML IV SOLN
INTRAVENOUS | Status: DC | PRN
  Administered 2013-03-18: 60 mg via INTRAVENOUS

## 2013-03-18 MED ORDER — LIDOCAINE HCL (CARDIAC) 20 MG/ML IV SOLN
INTRAVENOUS | Status: AC
  Filled 2013-03-18: qty 5

## 2013-03-18 MED ORDER — PROPOFOL 10 MG/ML IV BOLUS
INTRAVENOUS | Status: AC
  Filled 2013-03-18: qty 20

## 2013-03-18 MED ORDER — ONDANSETRON HCL 4 MG/2ML IJ SOLN
INTRAMUSCULAR | Status: AC
  Filled 2013-03-18: qty 2

## 2013-03-18 MED ORDER — IOHEXOL 300 MG/ML  SOLN
INTRAMUSCULAR | Status: DC | PRN
  Administered 2013-03-18: 23.5 mL

## 2013-03-18 MED ORDER — HYOSCYAMINE SULFATE 0.125 MG SL SUBL
0.1250 mg | SUBLINGUAL_TABLET | SUBLINGUAL | Status: DC | PRN
  Administered 2013-03-20 – 2013-03-21 (×4): 0.125 mg via SUBLINGUAL
  Filled 2013-03-18 (×3): qty 1

## 2013-03-18 MED ORDER — HYDROMORPHONE HCL PF 2 MG/ML IJ SOLN
INTRAMUSCULAR | Status: AC
  Filled 2013-03-18: qty 1

## 2013-03-18 MED ORDER — MAGIC MOUTHWASH W/LIDOCAINE
5.0000 mL | Freq: Three times a day (TID) | ORAL | Status: DC | PRN
  Filled 2013-03-18: qty 5

## 2013-03-18 MED ORDER — STERILE WATER FOR IRRIGATION IR SOLN
Status: DC | PRN
  Administered 2013-03-18: 1

## 2013-03-18 MED ORDER — ROCURONIUM BROMIDE 100 MG/10ML IV SOLN
INTRAVENOUS | Status: DC | PRN
  Administered 2013-03-18: 50 mg via INTRAVENOUS

## 2013-03-18 MED ORDER — PROPOFOL 10 MG/ML IV BOLUS
INTRAVENOUS | Status: DC | PRN
  Administered 2013-03-18: 180 mg via INTRAVENOUS

## 2013-03-18 MED ORDER — BUPIVACAINE HCL (PF) 0.25 % IJ SOLN
INTRAMUSCULAR | Status: DC | PRN
  Administered 2013-03-18: 30 mL

## 2013-03-18 SURGICAL SUPPLY — 57 items
BAG URINE DRAINAGE (UROLOGICAL SUPPLIES) ×3 IMPLANT
BASKET STONE NCOMPASS (UROLOGICAL SUPPLIES) ×3 IMPLANT
BASKET STONE NITINOL 3FRX115MB (UROLOGICAL SUPPLIES) IMPLANT
BENZOIN TINCTURE PRP APPL 2/3 (GAUZE/BANDAGES/DRESSINGS) ×6 IMPLANT
BNDG GAUZE ELAST 4 BULKY (GAUZE/BANDAGES/DRESSINGS) ×3 IMPLANT
CATH COUNCIL 22FR (CATHETERS) ×3 IMPLANT
CATH FOLEY 2W COUNCIL 20FR 5CC (CATHETERS) IMPLANT
CATH FOLEY 2WAY SLVR  5CC 18FR (CATHETERS) ×2
CATH FOLEY 2WAY SLVR 5CC 18FR (CATHETERS) ×1 IMPLANT
CATH ROBINSON RED A/P 20FR (CATHETERS) IMPLANT
CATH URET 5FR 28IN OPEN ENDED (CATHETERS) ×3 IMPLANT
CATH URET DUAL LUMEN 6-10FR 50 (CATHETERS) ×3 IMPLANT
CATH X-FORCE N30 NEPHROSTOMY (TUBING) ×3 IMPLANT
CHLORAPREP W/TINT 26ML (MISCELLANEOUS) IMPLANT
COVER SURGICAL LIGHT HANDLE (MISCELLANEOUS) ×3 IMPLANT
DRAPE C-ARM 42X120 X-RAY (DRAPES) ×3 IMPLANT
DRAPE CAMERA CLOSED 9X96 (DRAPES) ×3 IMPLANT
DRAPE LINGEMAN PERC (DRAPES) ×3 IMPLANT
DRAPE SURG IRRIG POUCH 19X23 (DRAPES) ×3 IMPLANT
DRAPE UTILITY XL STRL (DRAPES) ×3 IMPLANT
FIBER LASER FLEXIVA 365 (UROLOGICAL SUPPLIES) ×3 IMPLANT
FIBER LASER FLEXIVA 550 (UROLOGICAL SUPPLIES) IMPLANT
FIBER LASER TRAC TIP (UROLOGICAL SUPPLIES) ×3 IMPLANT
GAUZE SPONGE 4X4 16PLY XRAY LF (GAUZE/BANDAGES/DRESSINGS) ×3 IMPLANT
GLOVE BIOGEL M 7.0 STRL (GLOVE) IMPLANT
GLOVE BIOGEL PI IND STRL 7.5 (GLOVE) ×1 IMPLANT
GLOVE BIOGEL PI INDICATOR 7.5 (GLOVE) ×2
GLOVE ECLIPSE 7.0 STRL STRAW (GLOVE) ×3 IMPLANT
GUIDEWIRE AMPLAZ .035X145 (WIRE) ×6 IMPLANT
GUIDEWIRE ANG ZIPWIRE 038X150 (WIRE) ×3 IMPLANT
GUIDEWIRE STR DUAL SENSOR (WIRE) ×3 IMPLANT
KIT BALLIN UROMAX 15FX10 (LABEL) ×1 IMPLANT
KIT BASIN OR (CUSTOM PROCEDURE TRAY) ×3 IMPLANT
LASER FIBER DISP 1000U (UROLOGICAL SUPPLIES) IMPLANT
MANIFOLD NEPTUNE II (INSTRUMENTS) ×3 IMPLANT
PACK BASIC VI WITH GOWN DISP (CUSTOM PROCEDURE TRAY) ×3 IMPLANT
PAD ABD 8X10 STRL (GAUZE/BANDAGES/DRESSINGS) ×3 IMPLANT
PROBE LITHOCLAST ULTRA 3.8X403 (UROLOGICAL SUPPLIES) ×3 IMPLANT
PROBE PNEUMATIC 1.0MMX570MM (UROLOGICAL SUPPLIES) IMPLANT
SCRUB PCMX 4 OZ (MISCELLANEOUS) ×3 IMPLANT
SET HIGH PRES BAL DIL (LABEL) ×2
SET IRRIG Y TYPE TUR BLADDER L (SET/KITS/TRAYS/PACK) IMPLANT
SET WARMING FLUID IRRIGATION (MISCELLANEOUS) ×3 IMPLANT
SPONGE GAUZE 4X4 12PLY (GAUZE/BANDAGES/DRESSINGS) ×3 IMPLANT
SPONGE LAP 4X18 X RAY DECT (DISPOSABLE) ×3 IMPLANT
STENT ENDOURETEROTOMY 7-14 26C (STENTS) IMPLANT
STONE CATCHER W/TUBE ADAPTER (UROLOGICAL SUPPLIES) ×3 IMPLANT
SUT SILK 2 0 30  PSL (SUTURE)
SUT SILK 2 0 30 PSL (SUTURE) IMPLANT
SYR 20CC LL (SYRINGE) ×6 IMPLANT
SYRINGE 10CC LL (SYRINGE) ×3 IMPLANT
SYRINGE IRR TOOMEY STRL 70CC (SYRINGE) ×3 IMPLANT
TAPE CLOTH SURG 4X10 WHT LF (GAUZE/BANDAGES/DRESSINGS) ×3 IMPLANT
TOWEL OR NON WOVEN STRL DISP B (DISPOSABLE) ×3 IMPLANT
TRAY FOLEY CATH 14FRSI W/METER (CATHETERS) ×3 IMPLANT
TUBING CONNECTING 10 (TUBING) ×6 IMPLANT
TUBING CONNECTING 10' (TUBING) ×3

## 2013-03-18 NOTE — Progress Notes (Signed)
ANTIBIOTIC CONSULT NOTE - INITIAL  Pharmacy Consult for Gentamicin and may adjust abx for renal fxn Indication: post-surgical prophylaxis  No Known Allergies  Patient Measurements:   Weight 73kg.  Vital Signs: Temp: 97.8 F (36.6 C) (03/17 1619) Temp src: Oral (03/17 0855) BP: 142/78 mmHg (03/17 1619) Pulse Rate: 69 (03/17 1619) Intake/Output from previous day:   Intake/Output from this shift: Total I/O In: 2000 [I.V.:2000] Out: 1026 [Urine:826; Blood:200]  Labs:  Recent Labs  03/17/13 1015 03/18/13 1558  WBC 6.0  --   HGB 12.3 10.6*  PLT 700*  --   CREATININE 0.85  --    The CrCl is unknown because both a height and weight (above a minimum accepted value) are required for this calculation. No results found for this basename: VANCOTROUGH, VANCOPEAK, VANCORANDOM, GENTTROUGH, GENTPEAK, GENTRANDOM, TOBRATROUGH, TOBRAPEAK, TOBRARND, AMIKACINPEAK, AMIKACINTROU, AMIKACIN,  in the last 72 hours   Microbiology: Recent Results (from the past 720 hour(s))  CULTURE, BLOOD (ROUTINE X 2)     Status: None   Collection Time    03/04/13  2:37 AM      Result Value Ref Range Status   Specimen Description BLOOD LEFT ARM   Final   Special Requests BOTTLES DRAWN AEROBIC AND ANAEROBIC 5CC   Final   Culture  Setup Time     Final   Value: 03/04/2013 08:57     Performed at [facility]   Culture     Final   Value: NO GROWTH 5 DAYS     Performed at [facility]   Report Status 03/10/2013 FINAL   Final  CULTURE, BLOOD (ROUTINE X 2)     Status: None   Collection Time    03/04/13  2:42 AM      Result Value Ref Range Status   Specimen Description BLOOD LEFT ARM   Final   Special Requests BOTTLES DRAWN AEROBIC AND ANAEROBIC 5CC    Final   Culture  Setup Time     Final   Value: 03/04/2013 08:56     Performed at [facility]   Culture     Final   Value: NO GROWTH 5 DAYS     Performed at [facility]   Report Status 03/10/2013 FINAL   Final  URINE  CULTURE     Status: None   Collection Time    03/04/13  4:23 AM      Result Value Ref Range Status   Specimen Description URINE, CLEAN CATCH   Final   Special Requests NONE   Final   Culture  Setup Time     Final   Value: 03/04/2013 08:59     Performed at [facility]   Colony Count     Final   Value: NO GROWTH     Performed at [facility]   Culture     Final   Value: NO GROWTH     Performed at [facility]   Report Status 03/05/2013 FINAL   Final    Medical History: Past Medical History  Diagnosis Date  . Ectopic pregnancy   . Hypertension     hx pre eclampsia with last preg.  . Tendonitis     rt hand  . Eczema 03-17-13    none apparent at this time  . Shortness of breath     occasionally with exertion ,03-17-13 resolved at this time  . Kidney stone     hydronephrosis due to kidney stone  right, nephrostomy tube placed right and remains    Medications:  Anti-infectives   Start     Dose/Rate Route Frequency Ordered Stop   03/18/13 1700  ampicillin (OMNIPEN) 1 g in sodium chloride 0.9 % 50 mL IVPB     1 g 150 mL/hr over 20 Minutes Intravenous Every 6 hours 03/18/13 1640 03/19/13 1059   03/17/13 1731  gentamicin (GARAMYCIN) 300 mg in dextrose 5 % 100 mL IVPB     300 mg 107.5 mL/hr over 60 Minutes Intravenous 30 min pre-op 03/17/13 1731 03/18/13 1023   03/17/13 1731  ampicillin (OMNIPEN) 2 g in sodium chloride 0.9 % 50 mL IVPB     2 g 150 mL/hr over 20 Minutes Intravenous 30 min pre-op 03/17/13 1731 03/18/13 1107     Assessment: 37yo F underwent R perc nephrostolithotomy, dilation of R nephrostomy tube tract and R antegrade pyelogram on 3/17. Pharmacy is asked to dose gentamicin and adjust any other abx for post-op for infection prophylaxis. Amp 2g and Gent 300mg  given pre-op. Weight 73kg. Renal: SCr wnl, CrCl >100.  Goal of Therapy:  Appropriate antibiotic dosing for renal function; eradication of infection  Plan:   Gentamicin 300mg  IV q24h  - next dose 10am on 3/18.   Cont ampicillin 1g IV q6h x 3 as ordered.  F/u daily. Will consider a random gent level 10 hours after the dose on 3/18 if abx are to continue.  Charolotte Eke, PharmD, pager [phone]. 03/18/2013,4:53 PM.

## 2013-03-18 NOTE — Anesthesia Postprocedure Evaluation (Signed)
  Anesthesia Post-op Note  Patient: [name]  Procedure(s) Performed: Procedure(s) (LRB): RIGHT NEPHROLITHOTOMY PERCUTANEOUS   (1 STAGE) WITH HOLMIUM LASER (Right) HOLMIUM LASER APPLICATION (Right)  Patient Location: PACU  Anesthesia Type: General  Level of Consciousness: awake and alert   Airway and Oxygen Therapy: Patient Spontanous Breathing  Post-op Pain: mild  Post-op Assessment: Post-op Vital signs reviewed, Patient's Cardiovascular Status Stable, Respiratory Function Stable, Patent Airway and No signs of Nausea or vomiting  Last Vitals:  Filed Vitals:   03/18/13 1530  BP: 131/78  Pulse: 76  Temp:   Resp: 17    Post-op Vital Signs: stable   Complications: No apparent anesthesia complications

## 2013-03-18 NOTE — Progress Notes (Signed)
Patient ambulated in hall ~50 feet total.  She felt dizzy at first but tolerated ambulation well after the dizziness subsided.  Told patient she needed to ambulate again at least one more time tonight.  Patient agreed.

## 2013-03-18 NOTE — Progress Notes (Signed)
GU post-op f/u.  Patient has minimal pain. Well controlled w/ meds.  I explained the course of the surgery and surgical findings. I explained that the majority of his stone was in the mid pole and that I recommend IR access of this midpole to clear the most stones out. Other options include retrograde URS followed by 2nd look PCNL through the same site.  Filed Vitals:   03/18/13 1619  BP: 142/78  Pulse: 69  Temp: 97.8 F (36.6 C)  Resp: 18    Hgb: 10.6  Gen: NAD, AAO CV: RRR Chest: CTA-B, no crackles GU: Urine blood tinged.  A/P: Right staghorn nephrolithiasis.  -Regular diet now; NPO at MN in prep for IR perc procedure tomorrow. -Labs & coags in the AM.

## 2013-03-18 NOTE — Anesthesia Preprocedure Evaluation (Addendum)
Anesthesia Evaluation  Patient identified by MRN, date of birth, ID band Patient awake    Reviewed: Allergy & Precautions, H&P , NPO status , Patient's Chart, lab work & pertinent test results, reviewed documented beta blocker date and time   Airway Mallampati: II TM Distance: >3 FB Neck ROM: full    Dental no notable dental hx. (+) Teeth Intact, Dental Advisory Given   Pulmonary shortness of breath and with exertion, Current Smoker,  breath sounds clear to auscultation  Pulmonary exam normal       Cardiovascular Exercise Tolerance: Good hypertension, Rhythm:regular Rate:Normal     Neuro/Psych negative neurological ROS  negative psych ROS   GI/Hepatic negative GI ROS, Neg liver ROS,   Endo/Other  negative endocrine ROS  Renal/GU negative Renal ROS  negative genitourinary   Musculoskeletal   Abdominal   Peds  Hematology Platelets 700K   Anesthesia Other Findings   Reproductive/Obstetrics negative OB ROS                          Anesthesia Physical Anesthesia Plan  ASA: III  Anesthesia Plan: General   Post-op Pain Management:    Induction: Intravenous  Airway Management Planned: Oral ETT  Additional Equipment:   Intra-op Plan:   Post-operative Plan: Extubation in OR  Informed Consent: I have reviewed the patients History and Physical, chart, labs and discussed the procedure including the risks, benefits and alternatives for the proposed anesthesia with the patient or authorized representative who has indicated his/her understanding and acceptance.   Dental Advisory Given  Plan Discussed with: CRNA and Surgeon  Anesthesia Plan Comments:         Anesthesia Quick Evaluation

## 2013-03-18 NOTE — Op Note (Signed)
Urology Operative Report  Date of Procedure: 03/18/13  Surgeon: Natalia Leatherwood, MD Assistant:  None  Preoperative Diagnosis: Right staghorn renal calculus Postoperative Diagnosis:  Same  Procedure(s): Right percutaneous nephrostolithotomy (>5 cm) Dilation of right nephrostomy tube tract. Right antegrade pyelogram.  Estimated blood loss: 200 cc.  Specimen: Stone fragments removed.  Drains: Foley. Right nephroureteral catheter. Right nephrostomy tube.  Complications: None  Findings: Large stone burden.  History of present illness: Patient presents with a right staghorn calculus of the kidney which is larger than 5 cm. Attempted 1st stage nephrostolithotomy 2 weeks ago was complicated by inability to access the kidney percutaneously followed by fevers and tachycardia. She presents today for first stage nephrostolithotomy.   Procedure in detail: After informed consent was obtained, the patient was taken to the operating room. They were placed in the supine position. SCDs were turned on and in place. IV antibiotics were infused, and general anesthesia was induced. A large 18 French Foley catheter was placed using sterile technique by the nursing staff. She was placed in a prone position making sure to pad all pertinent neurovascular pressure points. Large genitals perpendicular to her thorax placed under her clavicles and anterior superior spine. Padding was placed underneath and her knees were slightly bent. Arms were positioned padding all pertinent neurovascular pressure points. Her breasts were free of pressure. She was then secured in place.  Her right flank and back were then prepped and draped in usual sterile fashion. A timeout was performed in which the correct patient, surgical site, and procedure were identified and agreed upon by the team.  A wire was placed through the nephroureteral catheter and into the bladder under fluoroscopy. The nephroureteral catheter was removed.  An incision was made in the skin and the fascia with a 15 blade scalpel proximally 1 cm in length. A dual-lumen access sheath was placed over the superstiff wire and into the renal collecting system.  I obtaineda right antegrade nephrostogram by injecting 10 cc of Omnipaque through the dual-lumen access sheath. This outlined the renal collecting system well. Contrast was noted to flow easily down the wide open proximal ureter. A second superstiff wire was placed down the ureter and into the bladder. One wire was then secured as a safety wire and the other was used as a working wire.  Over the working wire I placed the 15 French 10 cm balloon dilator and dilated to 12 atmospheres and held this for 3 minutes after the tip of position just inside the inferior calyx. The balloon was then deflated and a 12/14 ureter access sheath was placed over this wire into the collecting system. The wire and obturator were removed.  A flexible digital ureter scope was placed through this and into the kidney. I was in the posterior inferior calyx. I was able to navigate to the ureter but there was a tortuous course given the complexity of her internal calyces. I then withdrew into the posterior inferior calyx and noted where I wanted the tip of my future balloon to dilate. I then placed a superstiff wire through this area and down the proximal ureter and into the bladder.  The ureter access sheath was removed and the nephrostomy tube dilating balloon was placed over this under fluoroscopy. This was inflated to 12 atmospheres and held for 3 minutes. I then placed the large nephrostomy access sheath over the inflated balloon into the inferior calyx. The balloon was inflated without return of a large amount of blood.  I then  placed a flexible for scope through the access sheath and into the collecting system. There were several large stones noted which were removed with a basket. I attempted to place a rigid nephroscope, but  the stone was needed the broken was in the middle calyx and not accessible with the rigid scope.  I then replaced the flexible digital scope with a 365 micron filament and carried out lithotripsy at 0.5 J and 20 Hz. After large amount of manipulation and injection of Omnipaque through the ureter scope, was able to identify the middle calyx where the majority of the stone burden was located. I had to remove the larger holmium laser filament in place a 200  tract tip filament to try to break the stone up. Due to the angle, I was only successful in removing a small amount of the stone.  At this point, there was no other stone fragments which could be broken up, and therefore elected to him that procedure.  I placed a 5 [place] ureter catheter over the safety wire with the distal tip in the bladder on fluoroscopy. I then placed a 22 French council-tip catheter over the working wire into the renal pelvis. This was inflated with 2 cc of sterile water. I then injected contrast to this which showed good opacification of the collecting system without extravasation.  I then removed the access sheath and sewed the nephrostomy tube and nephroureteral catheter in place. I then removed both wires. Her back was sterilely dressed after I injected 30 cc of quarter percent Marcaine.  She was placed back in a supine position, anesthesia was reversed, and she was taken to the PACU in stable condition.  All counts were correct at the end of the case.  She is scheduled for second look pertinent nephrostolithotomy in 2 days. We will consider gaining extra access to the middle pole via interventional radiology tomorrow.

## 2013-03-18 NOTE — Transfer of Care (Signed)
Immediate Anesthesia Transfer of Care Note  Patient: [name]  Procedure(s) Performed: Procedure(s): RIGHT NEPHROLITHOTOMY PERCUTANEOUS   (1 STAGE) WITH HOLMIUM LASER (Right)  Patient Location: PACU  Anesthesia Type:General  Level of Consciousness: awake, alert , oriented and patient cooperative  Airway & Oxygen Therapy: Patient Spontanous Breathing and Patient connected to face mask oxygen  Post-op Assessment: Report given to PACU RN, Post -op Vital signs reviewed and stable and Patient moving all extremities X 4  Post vital signs: stable  Complications: No apparent anesthesia complications

## 2013-03-18 NOTE — H&P (Signed)
Urology History and Physical Exam  CC: Right staghorn calculus.  HPI:  38 year old female presents today for right staghorn calculus.  CT scan in January 2015 revealed right staghorn calculus which is 3.1 x 5.3 cm in size.  She was scheduled for a 2 staged percutaneous nephrostolithotomy on the right side.  Attempted access percutaneously was not successful on 03/03/13 and therefore retrograde ureteroscopy was performed to try to create room in the pelvis and calyces.  She was admitted to the hospital and access was obtained, but only of the lower posterior pole on 03/04/13.  She was scheduled to proceed with percutaneous nephrostolithotomy the following day, but she developed tachycardia and fever.  She was started on IV antibiotics and surgery was canceled.  N urine cultures returned negative.  She is grossly discharged home with her kidneys access in place.  She presents today for first stage right percutaneous nephrostolithotomy.  She has had some bladder spasms from the nephroureteral tube.  She's also had some flank pain.  Negative fevers.  She is scheduled for first stage today and the second stage is scheduled for 2 days from now.  Since discharge from the hospital, she has not had any fever. She did have a vaginal yeast infection and oral thrush.  PMH: Past Medical History  Diagnosis Date  . Ectopic pregnancy   . Hypertension     hx pre eclampsia with last preg.  . Tendonitis     rt hand  . Eczema 03-17-13    none apparent at this time  . Shortness of breath     occasionally with exertion ,03-17-13 resolved at this time  . Kidney stone     hydronephrosis due to kidney stone right, nephrostomy tube placed right and remains    PSH: Past Surgical History  Procedure Laterality Date  . Ectopic pregnancy surgery  2005  . Cesarean section with bilateral tubal ligation Right 10/18/2012    Procedure: REPEAT CESAREAN SECTION WITH BILATERAL TUBAL LIGATION;  Surgeon: Oliver PilaKathy W Richardson, MD;   Location: [facility];  Service: Obstetrics;  Laterality: Right; TOTAL OF 4 C SECTIONS  . Nephrolithotomy Right 03/03/2013    Procedure: Cystoscopy, Right Ureteroscopy with holmium laser With Strent;  Surgeon: Milford Cageaniel Young Marialuisa Basara, MD;  Location: [facility];  Service: Urology;  Laterality: Right;  . Holmium laser application Right 03/03/2013    Procedure: HOLMIUM LASER APPLICATION;  Surgeon: Milford Cageaniel Young Rourke Mcquitty, MD;  Location: [facility];  Service: Urology;  Laterality: Right;  . Percutaneous nephrostomy Right     Allergies: No Known Allergies  Medications: No prescriptions prior to admission     Social History: History   Social History  . Marital Status: Significant Other    Spouse Name: N/A    Number of Children: N/A  . Years of Education: N/A   Occupational History  . Not on file.   Social History Main Topics  . Smoking status: Light Tobacco Smoker -- 10 years  . Smokeless tobacco: Not on file     Comment: patient states she only smokes 1 cigarette a day- plans to quit  . Alcohol Use: Yes     Comment: occasional -rare  . Drug Use: No  . Sexual Activity: Yes   Other Topics Concern  . Not on file   Social History Narrative  . No narrative on file    Family History: Family History  Problem Relation Age of Onset  . Diabetes type II Mother   . Hypertension Mother   .  Diabetes type II Sister     Review of Systems: Positive: Sore throat, passage of stones. Negative: Fever, SOB, or chest pain.  A further 10 point review of systems was negative except what is listed in the HPI.  Physical Exam: Filed Vitals:   03/18/13 0855  BP: 133/84  Pulse: 90  Temp: 98.3 F (36.8 C)  Resp: 18    General: No acute distress.  Awake. Head:  Normocephalic.  Atraumatic. ENT:  EOMI.  Mucous membranes moist Neck:  Supple.  No lymphadenopathy. CV:  S1 present. S2 present. Regular rate. Pulmonary: Equal effort bilaterally.  Clear to auscultation bilaterally. Abdomen: Soft.  Non- tender  to palpation. Skin:  Normal turgor.  No visible rash. Extremity: No gross deformity of bilateral upper extremities.  No gross deformity of    bilateral lower extremities. Neurologic: Alert. Appropriate mood.  GU:  Right flank with right nephroureteral tube intact.  Studies:  Recent Labs     03/17/13  1015  HGB  12.3  WBC  6.0  PLT  700*    Recent Labs     03/17/13  1015  NA  138  K  3.3*  CL  99  CO2  23  BUN  9  CREATININE  0.85  CALCIUM  9.9  GFRNONAA  87*  GFRAA  >90     No results found for this basename: PT, INR, APTT,  in the last 72 hours   No components found with this basename: ABG,     Assessment:  Right staghorn calculus.  Plan: To OR for 1st stage right percutaneous nephrostolithotomy.

## 2013-03-19 ENCOUNTER — Encounter (HOSPITAL_COMMUNITY): Payer: Self-pay | Admitting: Urology

## 2013-03-19 ENCOUNTER — Inpatient Hospital Stay (HOSPITAL_COMMUNITY): Payer: Medicaid Other

## 2013-03-19 LAB — BASIC METABOLIC PANEL
BUN: 9 mg/dL (ref 6–23)
CALCIUM: 8.6 mg/dL (ref 8.4–10.5)
CO2: 26 mEq/L (ref 19–32)
CREATININE: 0.75 mg/dL (ref 0.50–1.10)
Chloride: 102 mEq/L (ref 96–112)
Glucose, Bld: 110 mg/dL — ABNORMAL HIGH (ref 70–99)
Potassium: 3.7 mEq/L (ref 3.7–5.3)
Sodium: 139 mEq/L (ref 137–147)

## 2013-03-19 LAB — CBC
HCT: 29.7 % — ABNORMAL LOW (ref 36.0–46.0)
Hemoglobin: 9.6 g/dL — ABNORMAL LOW (ref 12.0–15.0)
MCH: 24 pg — ABNORMAL LOW (ref 26.0–34.0)
MCHC: 32.3 g/dL (ref 30.0–36.0)
MCV: 74.3 fL — AB (ref 78.0–100.0)
PLATELETS: 552 10*3/uL — AB (ref 150–400)
RBC: 4 MIL/uL (ref 3.87–5.11)
RDW: 15.6 % — AB (ref 11.5–15.5)
WBC: 12.7 10*3/uL — AB (ref 4.0–10.5)

## 2013-03-19 LAB — PROTIME-INR
INR: 1.12 (ref 0.00–1.49)
Prothrombin Time: 14.2 seconds (ref 11.6–15.2)

## 2013-03-19 LAB — APTT: aPTT: 27 seconds (ref 24–37)

## 2013-03-19 MED ORDER — IOHEXOL 300 MG/ML  SOLN
25.0000 mL | Freq: Once | INTRAMUSCULAR | Status: AC | PRN
  Administered 2013-03-19: 40 mL

## 2013-03-19 MED ORDER — LIDOCAINE HCL 1 % IJ SOLN
INTRAMUSCULAR | Status: AC
  Filled 2013-03-19: qty 20

## 2013-03-19 MED ORDER — MIDAZOLAM HCL 2 MG/2ML IJ SOLN
INTRAMUSCULAR | Status: AC | PRN
  Administered 2013-03-19 (×6): 1 mg via INTRAVENOUS

## 2013-03-19 MED ORDER — FENTANYL CITRATE 0.05 MG/ML IJ SOLN
INTRAMUSCULAR | Status: AC | PRN
  Administered 2013-03-19 (×6): 50 ug via INTRAVENOUS

## 2013-03-19 MED ORDER — FENTANYL CITRATE 0.05 MG/ML IJ SOLN
INTRAMUSCULAR | Status: AC
  Filled 2013-03-19: qty 8

## 2013-03-19 MED ORDER — CEFAZOLIN SODIUM-DEXTROSE 2-3 GM-% IV SOLR
2.0000 g | INTRAVENOUS | Status: AC
  Administered 2013-03-19: 2 g via INTRAVENOUS
  Filled 2013-03-19: qty 50

## 2013-03-19 MED ORDER — MIDAZOLAM HCL 2 MG/2ML IJ SOLN
INTRAMUSCULAR | Status: AC
  Filled 2013-03-19: qty 8

## 2013-03-19 NOTE — H&P (Signed)
Agree.  Will proceed with perc access of right kidney later today.

## 2013-03-19 NOTE — Progress Notes (Signed)
Urology Progress Note  Subjective:     No acute urologic events overnight. Pain controlled.   ROS: Negative: fever or SOB.  Objective:  Patient Vitals for the past 24 hrs:  BP Temp Temp src Pulse Resp SpO2 Height Weight  03/19/13 0532 120/80 mmHg 98.3 F (36.8 C) Oral 75 16 100 % - -  03/19/13 0102 123/67 mmHg 97.7 F (36.5 C) Oral 63 16 100 % - -  03/18/13 2205 135/71 mmHg 97.5 F (36.4 C) Oral 73 17 99 % - -  03/18/13 1624 - - - - - - 5' (1.524 m) 73.02 kg (160 lb 15.7 oz)  03/18/13 1619 142/78 mmHg 97.8 F (36.6 C) - 69 18 100 % - -  03/18/13 1600 135/72 mmHg 97.7 F (36.5 C) - 73 14 100 % - -  03/18/13 1545 131/77 mmHg - - 71 14 100 % - -  03/18/13 1530 131/78 mmHg - - 76 17 100 % - -  03/18/13 1518 133/71 mmHg 97.6 F (36.4 C) - 79 12 100 % - -  03/18/13 0855 133/84 mmHg 98.3 F (36.8 C) Oral 90 18 100 % - -    Physical Exam: General:  No acute distress, awake Cardiovascular:    [x]   S1/S2 present, RRR  []   Irregularly irregular Chest:  CTA-B Abdomen:               []  Soft, appropriately TTP  [x]  Soft, NTTP  []  Soft, appropriately TTP, incision(s) clean/dry/intact  Genitourinary: Nephrostomy tube w/ scant bloody urine. Foley:  Clear urine.    I/O last 3 completed shifts: In: 2050 [I.V.:2000; IV Piggyback:50] Out: 1026 [Urine:826; Blood:200]  Recent Labs     03/17/13  1015  03/18/13  1558  03/19/13  0400  HGB  12.3  10.6*  9.6*  WBC  6.0   --   12.7*  PLT  700*   --   552*    Recent Labs     03/17/13  1015  03/19/13  0400  NA  138  139  K  3.3*  3.7  CL  99  102  CO2  23  26  BUN  9  9  CREATININE  0.85  0.75  CALCIUM  9.9  8.6  GFRNONAA  87*  >90  GFRAA  >90  >90     Recent Labs     03/19/13  0400  INR  1.12  APTT  27     No components found with this basename: ABG,     Length of stay: 1 days.  Assessment: Right staghorn renal stone POD#1 right PCNL   Plan: NPO.  Consult IR for right midpole percutaneous  nephroureteral cath.  Plan for 2nd look PCNL tomorrow.     Natalia Leatherwoodaniel Meilani Edmundson, MD [phone]

## 2013-03-19 NOTE — Procedures (Signed)
Procedure:  Right perc nephrostomy access with ureteral catheter placement Findings:  Difficult renal access.  Eventual mid-pole collecting system access accomplished with catheter advanced into bladder. Safety cath replaced and advanced into ureter.  Large-bore nephrostomy almost out of kidney.

## 2013-03-19 NOTE — Progress Notes (Signed)
INITIAL NUTRITION ASSESSMENT  DOCUMENTATION CODES Per approved criteria  -Obesity Unspecified   INTERVENTION: -Provided "Kidney Stone Nutrition Therapy" handout -Encouraged PO intake of calcium, fruits, vegetables, fluid intake -Will continue to montior  NUTRITION DIAGNOSIS: Unintentional wt loss related to inadequate energy intake as evidenced by 16 lbs wt loss in 2.5 weeks.   Goal: Pt to meet >/= 90% of their estimated nutrition needs    Monitor:  Diet order, total protein/energy intake, labs, weights, renal profile, swallow profile  Reason for Assessment: MST  38 y.o. female  Admitting Dx: <principal problem not specified>  ASSESSMENT: Pt with large R staghorn renal stone .Attempt for staghorn stone removal was performed 3/3.Pt developed tachycardia and fever- surgery was cancelled and rescheduled to 03/18/13: nephrostolithotomy was performed with Dr Margarita GrizzleWoodruff. Most of Rt staghorn renal stone was removed from lower pole. Now scheduled for new access and stage 2 of procedure to proceed 3/19.Scheduled now for access to Rt mid pole of kidney to complete calculi removal  -Pt reported usual body weight of 178 lbs around 03/03/2013 -Has experienced decreased appetite d/t GI bug and recent hospitalization -Reported having thrush during previous admit, which significantly decreased intake d/t mouth pain with chewing/swallowing. Also had feelings of lethargy, weakness and was on medications that made her sleep through meals. Was eating <50% of meals -After d/c, pt reported eating snacks and one meal/day. Has been trying to eat yogurt, and small amount of cereal for snacks -Experienced large amount of nausea and vomiting on Sunday,which resulted in minimal PO intake. Has ate very little past 2-3 days. Had loose stools on Sunday, last semi-formed BM was on Monday. -Pt with improved appetite last night, able to eat sandwich.  -Eager for diet advancement, noted that thrush has improved -Had  questions concerning diet and kidney stones, answered questions and provided handout from Nutrition Care Manual -Pt eager that weight loss occurred, but understands importance of healthy weight loss and proper nutrition -No visible signs of muscle wasting or subcutaneous fat loss  Height: Ht Readings from Last 1 Encounters:  03/18/13 5' (1.524 m)    Weight: Wt Readings from Last 1 Encounters:  03/18/13 160 lb 15.7 oz (73.02 kg)    Ideal Body Weight: 100 lbs  % Ideal Body Weight: 160%  Wt Readings from Last 10 Encounters:  03/18/13 160 lb 15.7 oz (73.02 kg)  03/18/13 160 lb 15.7 oz (73.02 kg)  03/17/13 161 lb (73.029 kg)  03/03/13 175 lb 0.7 oz (79.4 kg)  03/03/13 175 lb 0.7 oz (79.4 kg)  03/03/13 175 lb 0.7 oz (79.4 kg)  02/26/13 176 lb (79.833 kg)  10/18/12 185 lb (83.915 kg)  10/18/12 185 lb (83.915 kg)  10/16/12 185 lb (83.915 kg)    Usual Body Weight: 175 lbs  % Usual Body Weight: 91%  BMI:  Body mass index is 31.44 kg/(m^2). Obesity I  Estimated Nutritional Needs: Kcal: 1450-1650 Protein: 70-80 gram Fluid: >/=1500 ml/daily  Skin: WDL  Diet Order: NPO  EDUCATION NEEDS: -Education needs addressed   Intake/Output Summary (Last 24 hours) at 03/19/13 1155 Last data filed at 03/19/13 1015  Gross per 24 hour  Intake 5497.5 ml  Output   2276 ml  Net 3221.5 ml    Last BM: 3/15  Labs:   Recent Labs Lab 03/17/13 1015 03/19/13 0400  NA 138 139  K 3.3* 3.7  CL 99 102  CO2 23 26  BUN 9 9  CREATININE 0.85 0.75  CALCIUM 9.9 8.6  GLUCOSE 124*  110*    CBG (last 3)  No results found for this basename: GLUCAP,  in the last 72 hours  Scheduled Meds: .  ceFAZolin (ANCEF) IV  2 g Intravenous On Call  . gentamicin  300 mg Intravenous Q24H  . oxybutynin  10 mg Oral QHS  . senna-docusate  1 tablet Oral BID    Continuous Infusions: . sodium chloride 125 mL/hr at 03/19/13 0703    Past Medical History  Diagnosis Date  . Ectopic pregnancy   .  Hypertension     hx pre eclampsia with last preg.  . Tendonitis     rt hand  . Eczema 03-17-13    none apparent at this time  . Shortness of breath     occasionally with exertion ,03-17-13 resolved at this time  . Kidney stone     hydronephrosis due to kidney stone right, nephrostomy tube placed right and remains    Past Surgical History  Procedure Laterality Date  . Ectopic pregnancy surgery  2005  . Cesarean section with bilateral tubal ligation Right 10/18/2012    Procedure: REPEAT CESAREAN SECTION WITH BILATERAL TUBAL LIGATION;  Surgeon: Oliver Pila, MD;  Location: [facility];  Service: Obstetrics;  Laterality: Right; TOTAL OF 4 C SECTIONS  . Nephrolithotomy Right 03/03/2013    Procedure: Cystoscopy, Right Ureteroscopy with holmium laser With Strent;  Surgeon: Milford Cage, MD;  Location: [facility];  Service: Urology;  Laterality: Right;  . Holmium laser application Right 03/03/2013    Procedure: HOLMIUM LASER APPLICATION;  Surgeon: Milford Cage, MD;  Location: [facility];  Service: Urology;  Laterality: Right;  . Percutaneous nephrostomy Right     Lloyd Huger MS RD LDN Clinical Dietitian [phone]

## 2013-03-19 NOTE — H&P (Signed)
[name] is an 38 y.o. female.   Chief Complaint: Pt with large R staghorn renal stone Attempt for staghorn stone removal was performed 3/3 Pt developed tachycardia and fever- surgery was cancelled and rescheduled to 03/18/13: nephrostolithotomy was performed with Dr Jasmine December Most of Rt staghorn renal stone was removed from lower pole. Now scheduled for new access and stage 2 of procedure to proceed 3/19 Scheduled now for access to Rt mid pole of kidney to complete calculi removal. IR will place Rt percutaneous nephrostomy per Dr Jasmine December request  Wbc 12.7 (6) Hgb 9.6 (10.6) Hct 29.7 (31.6) INR 1.12 afeb  HPI: HTN; renal stone  Past Medical History  Diagnosis Date  . Ectopic pregnancy   . Hypertension     hx pre eclampsia with last preg.  . Tendonitis     rt hand  . Eczema 03-17-13    none apparent at this time  . Shortness of breath     occasionally with exertion ,03-17-13 resolved at this time  . Kidney stone     hydronephrosis due to kidney stone right, nephrostomy tube placed right and remains    Past Surgical History  Procedure Laterality Date  . Ectopic pregnancy surgery  2005  . Cesarean section with bilateral tubal ligation Right 10/18/2012    Procedure: REPEAT CESAREAN SECTION WITH BILATERAL TUBAL LIGATION;  Surgeon: Logan Bores, MD;  Location: [facility];  Service: Obstetrics;  Laterality: Right; TOTAL OF 4 C SECTIONS  . Nephrolithotomy Right 03/03/2013    Procedure: Cystoscopy, Right Ureteroscopy with holmium laser With Strent;  Surgeon: Molli Hazard, MD;  Location: [facility];  Service: Urology;  Laterality: Right;  . Holmium laser application Right 06/05/6801    Procedure: HOLMIUM LASER APPLICATION;  Surgeon: Molli Hazard, MD;  Location: [facility];  Service: Urology;  Laterality: Right;  . Percutaneous nephrostomy Right     Family History  Problem Relation Age of Onset  . Diabetes type II Mother   . Hypertension Mother   . Diabetes type II  Sister    Social History:  reports that she has been smoking.  She does not have any smokeless tobacco history on file. She reports that she drinks alcohol. She reports that she does not use illicit drugs.  Allergies: No Known Allergies  Medications Prior to Admission  Medication Sig Dispense Refill  . hyoscyamine (LEVSIN SL) 0.125 MG SL tablet Place 1 tablet (0.125 mg total) under the tongue every 4 (four) hours as needed (bladder spasms).  40 tablet  4  . Alum & Mag Hydroxide-Simeth (MAGIC MOUTHWASH W/LIDOCAINE) SOLN Take 5 mLs by mouth 3 (three) times daily as needed for mouth pain.  120 mL  0  . amoxicillin-clavulanate (AUGMENTIN) 875-125 MG per tablet Take 1 tablet by mouth every 12 (twelve) hours.  20 tablet  0  . HYDROmorphone (DILAUDID) 2 MG tablet Take 1-2 tablets (2-4 mg total) by mouth every 3 (three) hours as needed for severe pain.  100 tablet  0  . oxybutynin (DITROPAN-XL) 10 MG 24 hr tablet Take 1 tablet (10 mg total) by mouth at bedtime.  30 tablet  0  . senna-docusate (SENOKOT-S) 8.6-50 MG per tablet Take 1 tablet by mouth 2 (two) times daily.  60 tablet  3    Results for orders placed during the hospital encounter of 03/18/13 (from the past 48 hour(s))  HEMOGLOBIN AND HEMATOCRIT, BLOOD     Status: Abnormal   Collection Time    03/18/13  3:58 PM      Result Value Ref Range   Hemoglobin 10.6 (*) 12.0 - 15.0 g/dL   HCT 31.6 (*) 36.0 - 46.0 %  CBC     Status: Abnormal   Collection Time    03/19/13  4:00 AM      Result Value Ref Range   WBC 12.7 (*) 4.0 - 10.5 K/uL   RBC 4.00  3.87 - 5.11 MIL/uL   Hemoglobin 9.6 (*) 12.0 - 15.0 g/dL   HCT 29.7 (*) 36.0 - 46.0 %   MCV 74.3 (*) 78.0 - 100.0 fL   MCH 24.0 (*) 26.0 - 34.0 pg   MCHC 32.3  30.0 - 36.0 g/dL   RDW 15.6 (*) 11.5 - 15.5 %   Platelets 552 (*) 150 - 400 K/uL  BASIC METABOLIC PANEL     Status: Abnormal   Collection Time    03/19/13  4:00 AM      Result Value Ref Range   Sodium 139  137 - 147 mEq/L   Potassium  3.7  3.7 - 5.3 mEq/L   Chloride 102  96 - 112 mEq/L   CO2 26  19 - 32 mEq/L   Glucose, Bld 110 (*) 70 - 99 mg/dL   BUN 9  6 - 23 mg/dL   Creatinine, Ser 0.75  0.50 - 1.10 mg/dL   Calcium 8.6  8.4 - 10.5 mg/dL   GFR calc non Af Amer >90  >90 mL/min   GFR calc Af Amer >90  >90 mL/min   Comment: (NOTE)     The eGFR has been calculated using the CKD EPI equation.     This calculation has not been validated in all clinical situations.     eGFR's persistently <90 mL/min signify possible Chronic Kidney     Disease.  APTT     Status: None   Collection Time    03/19/13  4:00 AM      Result Value Ref Range   aPTT 27  24 - 37 seconds  PROTIME-INR     Status: None   Collection Time    03/19/13  4:00 AM      Result Value Ref Range   Prothrombin Time 14.2  11.6 - 15.2 seconds   INR 1.12  0.00 - 1.49   Dg Retrograde Pyelogram  03/18/2013   CLINICAL DATA:  Right-sided percutaneous nephrolithotomy  EXAM: RETROGRADE PYELOGRAM  COMPARISON:  IR INTRO URET CATH PERC *R* dated 03/04/2013; CT ABD/PELV WO CM dated 03/04/2013; DG ABDOMEN 1V dated 03/03/2013; CT ABD-PEL WO/W CM dated 01/14/2013  Fluoroscopy time:  3 min, 49 seconds  FINDINGS: Initial image demonstrates a percutaneous nephrolithotomy access catheter with tip excluded from view, heading towards the expected location of the urinary bladder.  There are multiple opacities overlying the right renal collecting system compatible with the known staghorn calculi demonstrated on prior abdominal CT.  Subsequent images demonstrate dilatation of the percutaneous tract and subsequent removal of the majority of opacities. Several persistent opacities remain over the superior and mid aspect of the right renal fossa and likely represent residual stones.  IMPRESSION: Post right-sided percutaneous nephrolithotomy with the removal of the majority of right-sided staghorn calculi.   Electronically Signed   By: Sandi Mariscal M.D.   On: 03/18/2013 15:25   Dg C-arm Gt 120 Min-no  Report  03/18/2013   CLINICAL DATA: Right percutaneous Nephrolithotomy   C-ARM GT 120 MINUTE  Fluoroscopy was utilized by the requesting physician.  No  radiographic  interpretation.     Review of Systems  Constitutional: Negative for fever.  Respiratory: Negative for shortness of breath.   Cardiovascular: Negative for chest pain.  Gastrointestinal: Positive for nausea. Negative for vomiting and abdominal pain.  Genitourinary: Positive for flank pain.  Neurological: Positive for dizziness and weakness. Negative for headaches.  Psychiatric/Behavioral: Negative for substance abuse.    Blood pressure 120/80, pulse 75, temperature 98.3 F (36.8 C), temperature source Oral, resp. rate 16, height 5' (1.524 m), weight 73.02 kg (160 lb 15.7 oz), last menstrual period 03/03/2013, SpO2 100.00%, not currently breastfeeding. Physical Exam  Constitutional: She is oriented to person, place, and time. She appears well-developed and well-nourished.  Cardiovascular: Normal rate, regular rhythm and normal heart sounds.   No murmur heard. Respiratory: Effort normal and breath sounds normal. She has no wheezes.  GI: Soft. Bowel sounds are normal. There is no tenderness.  Rt flank with red foley in place at Low pole  Musculoskeletal: Normal range of motion.  Neurological: She is alert and oriented to person, place, and time.  Skin: Skin is warm and dry.  Psychiatric: She has a normal mood and affect. Her behavior is normal. Judgment and thought content normal.     Assessment/Plan Large Rt renal staghorn stone Nephrostolithotomy was performed 3/17 with removal of stone from lower pole Scheduled for stage 2 -  3/19 for removal of stone from mid pole with Dr Jasmine December Now is scheduled in IR for mid pole access with percutaneous nephrostomy Pt aware of procedure benefits and risks and agreeable to proceed Consent signed and in chart.   Emmalyn Hinson A 03/19/2013, 9:22 AM

## 2013-03-20 ENCOUNTER — Encounter (HOSPITAL_COMMUNITY): Payer: Self-pay | Admitting: Registered Nurse

## 2013-03-20 ENCOUNTER — Encounter (HOSPITAL_COMMUNITY)
Admission: RE | Disposition: A | Discharge status: Home Health | Payer: Self-pay | Source: Ambulatory Visit | Attending: Urology

## 2013-03-20 LAB — CBC
HCT: 31 % — ABNORMAL LOW (ref 36.0–46.0)
Hemoglobin: 9.9 g/dL — ABNORMAL LOW (ref 12.0–15.0)
MCH: 23.8 pg — ABNORMAL LOW (ref 26.0–34.0)
MCHC: 31.9 g/dL (ref 30.0–36.0)
MCV: 74.5 fL — AB (ref 78.0–100.0)
PLATELETS: 564 10*3/uL — AB (ref 150–400)
RBC: 4.16 MIL/uL (ref 3.87–5.11)
RDW: 15.6 % — AB (ref 11.5–15.5)
WBC: 15 10*3/uL — AB (ref 4.0–10.5)

## 2013-03-20 LAB — BASIC METABOLIC PANEL
BUN: 7 mg/dL (ref 6–23)
CHLORIDE: 97 meq/L (ref 96–112)
CO2: 24 mEq/L (ref 19–32)
CREATININE: 0.87 mg/dL (ref 0.50–1.10)
Calcium: 8.7 mg/dL (ref 8.4–10.5)
GFR, EST NON AFRICAN AMERICAN: 85 mL/min — AB (ref >90)
Glucose, Bld: 116 mg/dL — ABNORMAL HIGH (ref 70–99)
Potassium: 3.6 mEq/L — ABNORMAL LOW (ref 3.7–5.3)
Sodium: 135 mEq/L — ABNORMAL LOW (ref 137–147)

## 2013-03-20 SURGERY — NEPHROLITHOTOMY PERCUTANEOUS SECOND LOOK
Anesthesia: General | Laterality: Right

## 2013-03-20 MED ORDER — ACETAMINOPHEN 325 MG PO TABS
650.0000 mg | ORAL_TABLET | Freq: Four times a day (QID) | ORAL | Status: DC | PRN
  Administered 2013-03-20: 650 mg via ORAL
  Filled 2013-03-20: qty 2

## 2013-03-20 MED ORDER — SODIUM CHLORIDE 0.9 % IV SOLN
1.0000 g | Freq: Four times a day (QID) | INTRAVENOUS | Status: DC
  Administered 2013-03-20 – 2013-03-22 (×9): 1 g via INTRAVENOUS
  Filled 2013-03-20 (×11): qty 1000

## 2013-03-20 NOTE — Progress Notes (Signed)
Received a page from nurse Chloe at 2am that pt has temp of 100 F , HR 89.  Pt asymptomatic.  IVF ongoing 125cc/hr.  Ordered tylenol and asked for a call back on update.  Received an update at 4am that temp is now 100.8 , HR 100.  Ordered stat blood and urine culture and start on Gent IV abx.  Cont w/ tylenol and IVF.  Will cont to monitor.

## 2013-03-20 NOTE — Progress Notes (Signed)
Urology Progress Note  Subjective:     No acute urologic events overnight. Spiked high fever (103) early this morning w/ associated tachycardia. Blood and urine cultures obtained.  IR able to gain percutaneous access to middle pole yesterday.  ROS: Negative: fever or SOB.  Objective:  Patient Vitals for the past 24 hrs:  BP Temp Temp src Pulse Resp SpO2  03/20/13 0600 127/65 mmHg 99.6 F (37.6 C) Oral 105 16 100 %  03/20/13 0345 144/75 mmHg 100.8 F (38.2 C) Oral 100 - 97 %  03/20/13 0200 132/71 mmHg 103 F (39.4 C) Oral 111 20 95 %  03/19/13 2210 152/84 mmHg 99.4 F (37.4 C) Oral 89 16 100 %  03/19/13 1642 133/78 mmHg 98 F (36.7 C) - 85 14 98 %  03/19/13 1637 154/86 mmHg - - 77 13 100 %  03/19/13 1633 134/86 mmHg - - 88 11 98 %  03/19/13 1630 136/82 mmHg - - 84 14 99 %  03/19/13 1628 137/73 mmHg - - 77 13 99 %  03/19/13 1625 139/77 mmHg - - 85 18 99 %  03/19/13 1621 144/86 mmHg - - 85 12 95 %  03/19/13 1619 137/82 mmHg - - 89 18 97 %  03/19/13 1615 126/81 mmHg - - 87 13 94 %  03/19/13 1612 134/78 mmHg - - 87 17 94 %  03/19/13 1609 136/74 mmHg - - 82 16 100 %  03/19/13 1606 148/82 mmHg - - 85 13 96 %  03/19/13 1603 139/80 mmHg - - 82 13 96 %  03/19/13 1600 145/91 mmHg - - 75 12 96 %  03/19/13 1557 136/81 mmHg - - 86 9 96 %  03/19/13 1555 159/93 mmHg - - 87 16 95 %  03/19/13 1552 140/68 mmHg - - 82 12 90 %  03/19/13 1548 157/68 mmHg - - 83 11 97 %  03/19/13 1545 143/92 mmHg - - 91 16 99 %  03/19/13 1539 154/90 mmHg - - 72 21 99 %  03/19/13 1521 156/80 mmHg - - 70 12 100 %  03/19/13 1350 137/71 mmHg 98.2 F (36.8 C) Oral 84 18 100 %  03/19/13 1029 125/70 mmHg 98 F (36.7 C) Oral 74 18 99 %    Physical Exam: General:  No acute distress, awake Cardiovascular:    [x]   S1/S2 present, Mild tachycardia  []   Irregularly irregular Chest:  CTA-B Abdomen:               []  Soft, appropriately TTP  [x]  Soft, NTTP  []  Soft, appropriately TTP, incision(s)  clean/dry/intact  Genitourinary: Nephrostomy tube w/ scant bloody urine. Foley:  Clear urine.    I/O last 3 completed shifts: In: 7035 [P.O.:1320; I.V.:5500; IV Piggyback:215] Out: 6665 [Urine:6665]  Recent Labs     03/17/13  1015  03/18/13  1558  03/19/13  0400  HGB  12.3  10.6*  9.6*  WBC  6.0   --   12.7*  PLT  700*   --   552*    Recent Labs     03/17/13  1015  03/19/13  0400  NA  138  139  K  3.3*  3.7  CL  99  102  CO2  23  26  BUN  9  9  CREATININE  0.85  0.75  CALCIUM  9.9  8.6  GFRNONAA  87*  >90  GFRAA  >90  >90     Recent Labs     03/19/13  0400  INR  1.12  APTT  27     No components found with this basename: ABG,     Length of stay: 2 days.  Assessment: Right staghorn renal stone POD#2 right PCNL   Plan: Regular diet.  Labs.  Cancel 2nd look PCNL today.  Continue IV fluids; increase rate to 150cc/hr.  IV antibiotics: ampicillin and gentamicin.     Natalia Leatherwood, MD [phone]

## 2013-03-20 NOTE — Progress Notes (Signed)
Patient spiked fever of 103. HR 111, resp 20, BP 132/71, Oxygen saturation 95% on RA. Urologist NP on call notified. New order placed. Tylenol 650 mg PO given. Cool rag placed on forehead, Ice packs applied to underarms, and blankets removed. Will continue to monitor.

## 2013-03-20 NOTE — Progress Notes (Signed)
Rechecked patient's temp-100.8. BP 144/75, HR 100, resp 18, Oxygen saturation 97% on RA. Urologist NP on call. New orders placed. Antibiotic given, blood cultures drawn, and urine culture collected. Will continue to monitor.

## 2013-03-21 LAB — CBC
HCT: 28.3 % — ABNORMAL LOW (ref 36.0–46.0)
Hemoglobin: 9.2 g/dL — ABNORMAL LOW (ref 12.0–15.0)
MCH: 23.9 pg — ABNORMAL LOW (ref 26.0–34.0)
MCHC: 32.5 g/dL (ref 30.0–36.0)
MCV: 73.5 fL — ABNORMAL LOW (ref 78.0–100.0)
PLATELETS: 548 10*3/uL — AB (ref 150–400)
RBC: 3.85 MIL/uL — ABNORMAL LOW (ref 3.87–5.11)
RDW: 15.4 % (ref 11.5–15.5)
WBC: 11.8 10*3/uL — AB (ref 4.0–10.5)

## 2013-03-21 LAB — URINE CULTURE

## 2013-03-21 LAB — BASIC METABOLIC PANEL
BUN: 5 mg/dL — AB (ref 6–23)
CHLORIDE: 99 meq/L (ref 96–112)
CO2: 25 mEq/L (ref 19–32)
Calcium: 8.7 mg/dL (ref 8.4–10.5)
Creatinine, Ser: 0.75 mg/dL (ref 0.50–1.10)
GFR calc non Af Amer: >90 mL/min (ref >90)
Glucose, Bld: 117 mg/dL — ABNORMAL HIGH (ref 70–99)
POTASSIUM: 3.1 meq/L — AB (ref 3.7–5.3)
SODIUM: 138 meq/L (ref 137–147)

## 2013-03-21 LAB — GENTAMICIN LEVEL, RANDOM: GENTAMICIN RM: 1.1 ug/mL

## 2013-03-21 MED ORDER — SENNOSIDES-DOCUSATE SODIUM 8.6-50 MG PO TABS: 1.0000 | ORAL_TABLET | Freq: Every day | ORAL | Status: DC

## 2013-03-21 MED ORDER — AMOXICILLIN-POT CLAVULANATE 875-125 MG PO TABS: 1.0000 | ORAL_TABLET | Freq: Two times a day (BID) | ORAL | Status: DC

## 2013-03-21 MED ORDER — HYDROMORPHONE HCL 2 MG PO TABS: 2.0000 mg | ORAL_TABLET | ORAL | Status: DC | PRN

## 2013-03-21 NOTE — Progress Notes (Signed)
RN assisted the patient to walk from the bed to the hallway.  The total length ambulated in a slow, agonizing gait was 40 feet.  Will continue to monitor patient.

## 2013-03-21 NOTE — Progress Notes (Signed)
Patient sat on the bedside commode and passed flatus which made the patient nearly smile.  Will continue to monitor the patient.

## 2013-03-21 NOTE — Progress Notes (Signed)
Patient had c/o some "chaffing" between the thighs. The patient had a partial bath which help alleviate the problem.  Will continue to monitor the patient.

## 2013-03-21 NOTE — Progress Notes (Signed)
Patient has eyes closed upon entering. She is laying down resting.  Patient informed of catheter removal. Catheter removed; 750 mL in catheter.  Patient also went to urinate at moment and had output of 325. Patient bathed and linen changed. Patient assessment as follows: Head in normocephalic with no lesions. Ears are flexible with no lesions present. Eyes are aligned with lateral canvas; sclera color is normal for ethnicity; conjunctiva pink and moist; pupils react and accommodate to light. Nose is midline; no deviated septum; mucous membranes pink and moist with no lesions. Skin is intact with no lesions.  Does have rash present in perineal area; cream applied. Pulses felt in all extremities (radial and pedal); 2+. Capillary refill less than 3 seconds. Heart rate and rhythm within normal limits. Lung sounds diminished in all lobes. Breathing is regular and unlabored. Patient reported pain level of 9. Pain medication given to alleviate pain. Patient is comfortable and does not need anything else at the moment. Lorn Junesenise Ariel Wingrove, Students Nurse.

## 2013-03-21 NOTE — Progress Notes (Signed)
I have reviewed all documentation from student nurse. Student nurse explained procedure prior to catheter removal and patient verbalized understanding. Student nurse removed 16 foley catheter after 10ml sterile water from balloon. Patient tolerated procedure without any complications. Education given also to patient about urinating in container in bathroom so urine could be measured and strained and she verbalized understanding on this request.

## 2013-03-21 NOTE — Progress Notes (Signed)
Urology Progress Note  Subjective:     No acute urologic events overnight. No fever or tachycardia overnight. Still w/ some pain and nephrostomy tube site.  ROS: Negative: fever or SOB.  Objective:  Patient Vitals for the past 24 hrs:  BP Temp Temp src Pulse Resp SpO2  03/21/13 0517 146/81 mmHg 98.2 F (36.8 C) Oral 97 18 99 %  03/21/13 0253 - 98.1 F (36.7 C) Oral - - -  03/20/13 2043 122/64 mmHg 97.9 F (36.6 C) Oral 87 16 99 %  03/20/13 1348 134/72 mmHg - - 97 - 100 %  03/20/13 0900 134/67 mmHg 98.5 F (36.9 C) Oral 85 18 100 %    Physical Exam: General:  No acute distress, awake Cardiovascular:    [x]   S1/S2 present, Mild tachycardia  []   Irregularly irregular Chest:  CTA-B Abdomen:               []  Soft, appropriately TTP  [x]  Soft, NTTP  []  Soft, appropriately TTP, incision(s) clean/dry/intact  Genitourinary: Nephrostomy tube w/ scant output. Right flank without ecchymosis. Foley:  Clear urine.    I/O last 3 completed shifts: In: 6832.5 [P.O.:1560; I.V.:4965; IV Piggyback:307.5] Out: 9770 [Urine:9770]  Recent Labs     03/20/13  0525  03/21/13  0340  HGB  9.9*  9.2*  WBC  15.0*  11.8*  PLT  564*  548*    Recent Labs     03/20/13  0525  03/21/13  0340  NA  135*  138  K  3.6*  3.1*  CL  97  99  CO2  24  25  BUN  7  5*  CREATININE  0.87  0.75  CALCIUM  8.7  8.7  GFRNONAA  85*  >90  GFRAA  >90  >90     Recent Labs     03/19/13  0400  INR  1.12  APTT  27     No components found with this basename: ABG,   Urine culture: negative for growth  Length of stay: 3 days.  Assessment: Right staghorn renal stone POD#3 right PCNL   Plan: D/c foley.  Cap nephrostomy tube.  Change right flank dressing daily starting today- order placed.  D/c home once pain controlled.  Rx for pain meds, antibiotic, and stool softener printed and on chart.     Natalia Leatherwoodaniel Logan [place], MD [phone]

## 2013-03-21 NOTE — Progress Notes (Signed)
ANTIBIOTIC CONSULT NOTE - Follow Up  Pharmacy Consult for Gentamicin and may adjust abx for renal fxn Indication: post-surgical prophylaxis, fever  No Known Allergies  Patient Measurements: Height: 5' (152.4 cm) Weight: 160 lb 15.7 oz (73.02 kg) IBW/kg (Calculated) : 45.5 Weight 73kg.  Vital Signs: Temp: 98.6 F (37 C) (03/20 0939) Temp src: Oral (03/20 0939) BP: 145/79 mmHg (03/20 1138) Pulse Rate: 98 (03/20 1138) Intake/Output from previous day: 03/19 0701 - 03/20 0700 In: 4745 [P.O.:1080; I.V.:3465; IV Piggyback:200] Out: 5555 [Urine:5555] Intake/Output from this shift: Total I/O In: 240 [P.O.:240] Out: 1625 [Urine:1625]  Labs:  Recent Labs  03/19/13 0400 03/20/13 0525 03/21/13 0340  WBC 12.7* 15.0* 11.8*  HGB 9.6* 9.9* 9.2*  PLT 552* 564* 548*  CREATININE 0.75 0.87 0.75   Estimated Creatinine Clearance: 86.7 ml/min (by C-G formula based on Cr of 0.75). No results found for this basename: VANCOTROUGH, VANCOPEAK, VANCORANDOM, GENTTROUGH, GENTPEAK, GENTRANDOM, TOBRATROUGH, TOBRAPEAK, TOBRARND, AMIKACINPEAK, AMIKACINTROU, AMIKACIN,  in the last 72 hours   Microbiology: Recent Results (from the past 720 hour(s))  CULTURE, BLOOD (ROUTINE X 2)     Status: None   Collection Time    03/04/13  2:37 AM      Result Value Ref Range Status   Specimen Description BLOOD LEFT ARM   Final   Special Requests BOTTLES DRAWN AEROBIC AND ANAEROBIC 5CC   Final   Culture  Setup Time     Final   Value: 03/04/2013 08:57     Performed at [facility]   Culture     Final   Value: NO GROWTH 5 DAYS     Performed at [facility]   Report Status 03/10/2013 FINAL   Final  CULTURE, BLOOD (ROUTINE X 2)     Status: None   Collection Time    03/04/13  2:42 AM      Result Value Ref Range Status   Specimen Description BLOOD LEFT ARM   Final   Special Requests BOTTLES DRAWN AEROBIC AND ANAEROBIC 5CC    Final   Culture  Setup Time     Final   Value: 03/04/2013 08:56   Performed at [facility]   Culture     Final   Value: NO GROWTH 5 DAYS     Performed at [facility]   Report Status 03/10/2013 FINAL   Final  URINE CULTURE     Status: None   Collection Time    03/04/13  4:23 AM      Result Value Ref Range Status   Specimen Description URINE, CLEAN CATCH   Final   Special Requests NONE   Final   Culture  Setup Time     Final   Value: 03/04/2013 08:59     Performed at [facility] Count     Final   Value: NO GROWTH     Performed at [facility]   Culture     Final   Value: NO GROWTH     Performed at [facility]   Report Status 03/05/2013 FINAL   Final  URINE CULTURE     Status: None   Collection Time    03/20/13  5:12 AM      Result Value Ref Range Status   Specimen Description URINE, CATHETERIZED   Final   Special Requests NONE   Final   Culture  Setup Time     Final   Value: 03/20/2013 08:25  [facility]   [place] Count     Final   Value: NO GROWTH     [facility]   Culture     Final   Value: NO GROWTH     [facility]   Report Status 03/21/2013 FINAL   Final  CULTURE, BLOOD (ROUTINE X 2)     Status: None   Collection Time    03/20/13  5:15 AM      Result Value Ref Range Status   Specimen Description BLOOD RIGHT ARM   Final   Special Requests BOTTLES DRAWN AEROBIC AND ANAEROBIC [facility]7CC   Final   Culture  Setup Time     Final   Value: 03/20/2013 08:13     [facility]   Culture     Final   Value:        BLOOD CULTURE RECEIVED NO GROWTH TO DATE CULTURE WILL BE HELD FOR 5 DAYS BEFORE ISSUING A FINAL NEGATIVE REPORT     [facility]   Report Status PENDING   Incomplete  CULTURE, BLOOD (ROUTINE X 2)     Status: None   Collection Time    03/20/13  5:25 AM      Result Value Ref Range Status   Specimen Description BLOOD RIGHT ARM   Final   Special Requests BOTTLES DRAWN AEROBIC AND  ANAEROBIC 4CC   Final   Culture  Setup Time     Final   Value: 03/20/2013 08:13     [facility]   Culture     Final   Value:        BLOOD CULTURE RECEIVED NO GROWTH TO DATE CULTURE WILL BE HELD FOR 5 DAYS BEFORE ISSUING A FINAL NEGATIVE REPORT     [facility]   Report Status PENDING   Incomplete    Medical History: Past Medical History  Diagnosis Date  . Ectopic pregnancy   . Hypertension     hx pre eclampsia with last preg.  . Tendonitis     rt hand  . Eczema 03-17-13    none apparent at this time  . Shortness of breath     occasionally with exertion ,03-17-13 resolved at this time  . Kidney stone     hydronephrosis due to kidney stone right, nephrostomy tube placed right and remains   Assessment: 37yo F underwent R perc nephrostolithotomy, dilation of R nephrostomy tube tract and R antegrade pyelogram on 3/17. Pharmacy was asked to dose gentamicin and adjust any other abx for post-op for infection prophylaxis. Amp 2g and Gent 300mg  given pre-op.  Plan was for 2nd look PCNL 3/19 but patient spiked high fever (103) early 3/19, blood/urine cultures drawn and MD ordered to continue Gentamicin and Ampicillin for treatment.  2nd look rescheduled.  Per MD, continuing IV antibiotics while inpatient and discharge on PO abx once her pain is controlled.    3/17 >> Ampicillin >> 3/18, resumed 3/19>> 3/17 >> Gent >>   Tmax: 103 (3/19 AM), Tcurrent: 98.6 WBCs: 11.8, improved Renal: SCr 0.75, stable, CrCl >100 ml/min  3/19 blood x 2: ngtd 3/19 urine: no growth, final  Day #4 Gentamicin 300 mg IV q24h and Ampicillin 1g IV q6h.  Will check a 10 hour level following today's gentamicin dose since patient should now be at steady state.  Will adjust dose as needed, anticipate gentamicin will continue through  weekend.  Goal of Therapy:  Appropriate antibiotic dosing for renal function; eradication of infection  Plan:  Check gentamicin level at 2000  tonight.  This will be 10 hours following the 4th dose of extended interval gentamicin dosing.  Clance Boll, PharmD, BCPS Pager: [phone] 03/21/2013 1:32 PM

## 2013-03-21 NOTE — Progress Notes (Signed)
Patient laying in bed upon entry. Patient quietly watching tv.  Patient went to urinate at moment and had output of 625. Patient assessment as follows: Head in normocephalic with no lesions. Ears are flexible with no lesions present. Eyes are aligned with lateral canvas; sclera color is normal for ethnicity; conjunctiva pink and moist; pupils react and accommodate to light. Nose is midline; no deviated septum; mucous membranes pink and moist with no lesions. Skin is intact with no lesions. Rash present in perineal area. Pulses felt in all extremities (radial and pedal); 2+. Capillary refill less than 3 seconds. Heart rate and rhythm within normal limits. Lung sounds heard in all four quadrants; no adventitious sounds. Breathing is regular and unlabored. Patient reported pain level of 8. Notified nurse of pain level. Patient asked for two sodas. Received and was content. Patient is comfortable and does not need anything else at the moment. Lorn Junesenise Recardo [place], Students Nurse.  Filed Vitals:   03/21/13 1447  BP: 145/79  Pulse: 95  Temp: 99 F (37.2 C)  Resp:

## 2013-03-21 NOTE — Progress Notes (Signed)
Patient feeling much better this afternoon. Still with pain control issues, but it appears the PO dilaudid will work. Likely discharge home tomorrow.

## 2013-03-22 LAB — BASIC METABOLIC PANEL
BUN: 5 mg/dL — AB (ref 6–23)
CALCIUM: 8.8 mg/dL (ref 8.4–10.5)
CO2: 27 mEq/L (ref 19–32)
Chloride: 97 mEq/L (ref 96–112)
Creatinine, Ser: 0.8 mg/dL (ref 0.50–1.10)
Glucose, Bld: 94 mg/dL (ref 70–99)
Potassium: 3.4 mEq/L — ABNORMAL LOW (ref 3.7–5.3)
Sodium: 136 mEq/L — ABNORMAL LOW (ref 137–147)

## 2013-03-22 LAB — CBC
HCT: 27.8 % — ABNORMAL LOW (ref 36.0–46.0)
HEMOGLOBIN: 8.9 g/dL — AB (ref 12.0–15.0)
MCH: 23.7 pg — ABNORMAL LOW (ref 26.0–34.0)
MCHC: 32 g/dL (ref 30.0–36.0)
MCV: 73.9 fL — ABNORMAL LOW (ref 78.0–100.0)
Platelets: 518 10*3/uL — ABNORMAL HIGH (ref 150–400)
RBC: 3.76 MIL/uL — AB (ref 3.87–5.11)
RDW: 15.2 % (ref 11.5–15.5)
WBC: 12.5 10*3/uL — AB (ref 4.0–10.5)

## 2013-03-22 MED ORDER — GENTAMICIN SULFATE 40 MG/ML IJ SOLN
7.0000 mg/kg | INTRAVENOUS | Status: DC
  Administered 2013-03-22: 400 mg via INTRAVENOUS
  Filled 2013-03-22: qty 10

## 2013-03-22 NOTE — Progress Notes (Signed)
ANTIBIOTIC CONSULT NOTE - FOLLOW UP  Pharmacy Consult for Gentamicin Indication: Fever  No Known Allergies  Patient Measurements: Height: 5' (152.4 cm) Weight: 160 lb 15.7 oz (73.02 kg) IBW/kg (Calculated) : 45.5 Adjusted Body Weight:   Vital Signs: Temp: 98.4 F (36.9 C) (03/21 0516) Temp src: Oral (03/21 0516) BP: 135/78 mmHg (03/21 0516) Pulse Rate: 92 (03/21 0516) Intake/Output from previous day: 03/20 0701 - 03/21 0700 In: 3644.5 [P.O.:822; I.V.:2672.5; IV Piggyback:150] Out: 5325 [Urine:5325] Intake/Output from this shift:    Labs:  Recent Labs  03/20/13 0525 03/21/13 0340 03/22/13 0356  WBC 15.0* 11.8* 12.5*  HGB 9.9* 9.2* 8.9*  PLT 564* 548* 518*  CREATININE 0.87 0.75 0.80   Estimated Creatinine Clearance: 86.7 ml/min (by C-G formula based on Cr of 0.8).  Recent Labs  03/21/13 1940  GENTRANDOM 1.1     Microbiology: Recent Results (from the past 720 hour(s))  CULTURE, BLOOD (ROUTINE X 2)     Status: None   Collection Time    03/04/13  2:37 AM      Result Value Ref Range Status   Specimen Description BLOOD LEFT ARM   Final   Special Requests BOTTLES DRAWN AEROBIC AND ANAEROBIC 5CC   Final   Culture  Setup Time     Final   Value: 03/04/2013 08:57     [facility]formed at [facility]   Culture     Final   Value: NO GROWTH 5 DAYS     Performed at [facility]   Report Status 03/10/2013 FINAL   Final  CULTURE, BLOOD (ROUTINE X 2)     Status: None   Collection Time    03/04/13  2:42 AM      Result Value Ref Range Status   Specimen Description BLOOD LEFT ARM   Final   Special Requests BOTTLES DRAWN AEROBIC AND ANAEROBIC 5CC    Final   Culture  Setup Time     Final   Value: 03/04/2013 08:56     Performed at [facility]   Culture     Final   Value: NO GROWTH 5 DAYS     Performed at [facility]   Report Status 03/10/2013 FINAL   Final  URINE CULTURE     Status: None   Collection Time    03/04/13  4:23 AM      Result  Value Ref Range Status   Specimen Description URINE, CLEAN CATCH   Final   Special Requests NONE   Final   Culture  Setup Time     Final   Value: 03/04/2013 08:59     Performed at [facility]   Colony Count     Final   Value: NO GROWTH     Performed at [facility]   Culture     Final   Value: NO GROWTH     Performed at [facility]   Report Status 03/05/2013 FINAL   Final  URINE CULTURE     Status: None   Collection Time    03/20/13  5:12 AM      Result Value Ref Range Status   Specimen Description URINE, CATHETERIZED   Final   Special Requests NONE   Final   Culture  Setup Time     Final   Value: 03/20/2013 08:25     Performed at [facility]   Colony Count     Final   Value: NO GROWTH  Performed at [facility]     Final   Value: NO GROWTH     Performed at [facility]   Report Status 03/21/2013 FINAL   Final  CULTURE, BLOOD (ROUTINE X 2)     Status: None   Collection Time    03/20/13  5:15 AM      Result Value Ref Range Status   Specimen Description BLOOD RIGHT ARM   Final   Special Requests BOTTLES DRAWN AEROBIC AND ANAEROBIC [facility]   Final   Culture  Setup Time     Final   Value: 03/20/2013 08:13     Performed at [facility]   Culture     Final   Value:        BLOOD CULTURE RECEIVED NO GROWTH TO DATE CULTURE WILL BE HELD FOR 5 DAYS BEFORE ISSUING A FINAL NEGATIVE REPORT     Performed at [facility]   Report Status PENDING   Incomplete  CULTURE, BLOOD (ROUTINE X 2)     Status: None   Collection Time    03/20/13  5:25 AM      Result Value Ref Range Status   Specimen Description BLOOD RIGHT ARM   Final   Special Requests BOTTLES DRAWN AEROBIC AND ANAEROBIC 4CC   Final   Culture  Setup Time     Final   Value: 03/20/2013 08:13     Performed at [facility]   Culture     Final   Value:        BLOOD CULTURE RECEIVED NO GROWTH TO DATE CULTURE WILL BE HELD FOR 5 DAYS BEFORE ISSUING A  FINAL NEGATIVE REPORT     Performed at [facility]   Report Status PENDING   Incomplete    Anti-infectives   Start     Dose/Rate Route Frequency Ordered Stop   03/22/13 0800  gentamicin (GARAMYCIN) 400 mg in dextrose 5 % 100 mL IVPB     7 mg/kg  56.5 kg (Adjusted) 110 mL/hr over 60 Minutes Intravenous Every 24 hours 03/22/13 0709     03/21/13 0000  amoxicillin-clavulanate (AUGMENTIN) 875-125 MG per tablet     1 tablet Oral 2 times daily 03/21/13 0808     03/20/13 1000  ampicillin (OMNIPEN) 1 g in sodium chloride 0.9 % 50 mL IVPB     1 g 150 mL/hr over 20 Minutes Intravenous Every 6 hours 03/20/13 0736     03/19/13 1000  gentamicin (GARAMYCIN) 300 mg in dextrose 5 % 100 mL IVPB  Status:  Discontinued     300 mg 107.5 mL/hr over 60 Minutes Intravenous Every 24 hours 03/18/13 1655 03/22/13 0709   03/19/13 0945  ceFAZolin (ANCEF) IVPB 2 g/50 mL premix    Comments:  Hang ON CALL to xray 3/18   2 g 100 mL/hr over 30 Minutes Intravenous On call 03/19/13 0938 03/19/13 1552   03/18/13 1700  ampicillin (OMNIPEN) 1 g in sodium chloride 0.9 % 50 mL IVPB     1 g 150 mL/hr over 20 Minutes Intravenous Every 6 hours 03/18/13 1640 03/19/13 0512   03/17/13 1731  gentamicin (GARAMYCIN) 300 mg in dextrose 5 % 100 mL IVPB     300 mg 107.5 mL/hr over 60 Minutes Intravenous 30 min pre-op 03/17/13 1731 03/18/13 1023   03/17/13 1731  ampicillin (OMNIPEN) 2 g in sodium chloride 0.9 % 50 mL IVPB     2 g 150 mL/hr over  20 Minutes Intravenous 30 min pre-op 03/17/13 1731 03/18/13 1107      Assessment: Patient with 10hr gentamicin level within goal for post-surgical prophylaxis on 5mg /kg.  Now gentamicin is being continued for fever.  Goal of Therapy:  Gentamicin dosing based on [place] nomogram (7mg /kg)  Plan:  Follow up culture results Gentamicin 400mg  iv q24hr Recheck gentamicin level at [address], [place] 03/22/2013,7:09 AM

## 2013-03-22 NOTE — Progress Notes (Signed)
Vitals normal Dilaudid iv 1 mg twice, othwerwide po control Feels good and wants to go home Belly soft Dressing intact WBC 12.5; Hb stable at 8.9; Cr normal Organized home health for dressing change D/c home

## 2013-03-22 NOTE — Progress Notes (Signed)
   CARE MANAGEMENT NOTE 03/22/2013  Patient:  [name],[name]   Account Number:  [id number]  Date Initiated:  03/19/2013  Documentation initiated by:  Ezekiel InaMcGIBBONEY,COOKIE  Subjective/Objective Assessment:   pt admitted with Staghorn     Action/Plan:   from home   Anticipated DC Date:  03/22/2013   Anticipated DC Plan:  HOME/SELF CARE      DC Planning Services  CM consult      [facility]  [facility]   Choice offered to / List presented to:  C-1 Patient        HH arranged  HH-1 RN      [facility]   Status of service:  In process, will continue to follow Medicare Important Message given?  NA - LOS <3 / Initial given by admissions (If response is "NO", the following Medicare IM given date fields will be blank) Date Medicare IM given:   Date Additional Medicare IM given:    Discharge Disposition:  HOME W HOME HEALTH SERVICES  Per UR Regulation:  Reviewed for med. necessity/level of care/duration of stay  If discussed at Long Length of Stay Meetings, dates discussed:    Comments:  03/22/2013 1315 NCM spoke to pt and offered [facility] for nephrostomy wound care. Isidoro DonningAlesia Arthi Mcdonald RN CCM Case Mgmt phone [phone]  03/19/13 MMcGibboney, RN, BSN Chart reviewed.

## 2013-03-22 NOTE — Progress Notes (Signed)
Pt assessment unchanged. Denies discomfort at present time, resting quietly without distress. SRP,RN  

## 2013-03-22 NOTE — Discharge Instructions (Signed)
Dressing change with home nurse Monday and Wednesday Dos and donts discussed Surgery thursday

## 2013-03-24 ENCOUNTER — Other Ambulatory Visit: Payer: Self-pay | Admitting: Urology

## 2013-03-24 NOTE — ED Provider Notes (Signed)
Medical screening examination/treatment/procedure(s) were performed by non-physician practitioner and as supervising physician I was immediately available for consultation/collaboration.   Marvine Encalade L Rayven Hendrickson, MD 03/24/13 1113 

## 2013-03-26 ENCOUNTER — Encounter (HOSPITAL_COMMUNITY): Payer: Self-pay | Admitting: *Deleted

## 2013-03-26 LAB — CULTURE, BLOOD (ROUTINE X 2)
Culture: NO GROWTH
Culture: NO GROWTH

## 2013-03-26 MED ORDER — GENTAMICIN SULFATE 40 MG/ML IJ SOLN
5.0000 mg/kg | INTRAVENOUS | Status: AC
  Administered 2013-03-27: 370 mg via INTRAVENOUS
  Filled 2013-03-26: qty 9.25

## 2013-03-26 NOTE — Discharge Summary (Signed)
Physician Discharge Summary  Patient ID: [name] MRN: [id number] DOB/AGE: [birth year] 38 y.o.  Admit date: 03/18/2013 Discharge date: 03/22/2013  Admission Diagnoses: Right staghorn renal stone  Discharge Diagnoses:  Active Problems:   Staghorn kidney stones Tachycardia Fever  Discharged Condition: good  Hospital Course:  This patient was admitted to the hospital following first stage right-sided percutaneous nephrostolithotomy for a right staghorn renal calculus. She presented to the hospital for surgery. She already had percutaneous access from a previous hospitalization. She was taken to the operating room where the first stage of a right percutaneous nephrostolithotomy was performed. Due to the size of the stone and complexity of her internal anatomy, the majority of the stone remained intact. She was admitted as planned. The following day, she had a second percutaneous access obtained by interventional radiology. Early the next morning on postoperative day 2 when she was planned to have a second look percutaneous nephrostolithotomy, she began having tachycardia and fevers. This was felt to be likely due to bacteria associated with a stone. Urine and blood cultures were obtained and her second case was canceled. She was treated with IV antibiotics (ampicillin and gentamicin) with pharmacy dosing. She also received IV fluids. Fever and tachycardia resolved. Blood and urine cultures returned no growth. She was able to be discharged home with percutaneous ostomy tube in place. The plan was to restart her second look to the next week.  Consults: Interventional radiology  Significant Diagnostic Studies: microbiology: blood culture: negative and urine culture: negative  Treatments: IV hydration, antibiotics: gentamycin and ampicillin and surgery: Right percutaneous nephrostolithotomy.  Discharge Exam: Blood pressure 135/78, pulse 92, temperature 98.4 F (36.9 C), temperature source  Oral, resp. rate 20, height 5' (1.524 m), weight 73.02 kg (160 lb 15.7 oz), last menstrual period 03/03/2013, SpO2 97.00%, not currently breastfeeding. Refer to PE from date of discharge.  Disposition: 01-Home or Self Care     Medication List    STOP taking these medications       oxybutynin 10 MG 24 hr tablet  Commonly known as:  DITROPAN-XL      TAKE these medications       amoxicillin-clavulanate 875-125 MG per tablet  Commonly known as:  AUGMENTIN  Take 1 tablet by mouth every 12 (twelve) hours.     amoxicillin-clavulanate 875-125 MG per tablet  Commonly known as:  AUGMENTIN  Take 1 tablet by mouth 2 (two) times daily.     HYDROmorphone 2 MG tablet  Commonly known as:  DILAUDID  Take 1-2 tablets (2-4 mg total) by mouth every 3 (three) hours as needed for severe pain.     HYDROmorphone 2 MG tablet  Commonly known as:  DILAUDID  Take 1-2 tablets (2-4 mg total) by mouth every 4 (four) hours as needed for severe pain.     hyoscyamine 0.125 MG SL tablet  Commonly known as:  LEVSIN SL  Place 1 tablet (0.125 mg total) under the tongue every 4 (four) hours as needed (bladder spasms).     magic mouthwash w/lidocaine Soln  Take 5 mLs by mouth 3 (three) times daily as needed for mouth pain.     senna-docusate 8.6-50 MG per tablet  Commonly known as:  Senokot-S  Take 1 tablet by mouth 2 (two) times daily.     senna-docusate 8.6-50 MG per tablet  Commonly known as:  SENOKOT S  Take 1 tablet by mouth daily.           Follow-up Information  Follow up with Milford CageWoodruff, Dyllin Gulley Young, MD. (as scheduled)    Specialty:  Urology   Contact information:   [address] [facility]  [place] [address] [phone]       [facility]. [facility] Health RN)    Contact information:   [address] [place] [address] [phone]       Signed: Milford CageWoodruff, Osiel Stick Young 03/26/2013, 9:33 AM

## 2013-03-27 ENCOUNTER — Inpatient Hospital Stay (HOSPITAL_COMMUNITY): Payer: Medicaid Other | Admitting: Anesthesiology

## 2013-03-27 ENCOUNTER — Encounter (HOSPITAL_COMMUNITY): Payer: Self-pay

## 2013-03-27 ENCOUNTER — Encounter (HOSPITAL_COMMUNITY)
Admission: RE | Disposition: A | Discharge status: Home / Self Care | Payer: Self-pay | Source: Ambulatory Visit | Attending: Urology

## 2013-03-27 ENCOUNTER — Encounter (HOSPITAL_COMMUNITY): Payer: Medicaid Other | Admitting: Anesthesiology

## 2013-03-27 ENCOUNTER — Inpatient Hospital Stay (HOSPITAL_COMMUNITY)
Admission: RE | Admit: 2013-03-27 | Discharge: 2013-03-28 | DRG: 661 | Disposition: A | Discharge status: Home / Self Care | Payer: Medicaid Other | Source: Ambulatory Visit | Attending: Urology | Admitting: Urology

## 2013-03-27 ENCOUNTER — Inpatient Hospital Stay (HOSPITAL_COMMUNITY): Payer: Medicaid Other

## 2013-03-27 DIAGNOSIS — F17200 Nicotine dependence, unspecified, uncomplicated: Secondary | ICD-10-CM | POA: Diagnosis present

## 2013-03-27 DIAGNOSIS — Z87442 Personal history of urinary calculi: Secondary | ICD-10-CM

## 2013-03-27 DIAGNOSIS — Z833 Family history of diabetes mellitus: Secondary | ICD-10-CM

## 2013-03-27 DIAGNOSIS — N200 Calculus of kidney: Principal | ICD-10-CM | POA: Diagnosis present

## 2013-03-27 DIAGNOSIS — Z8249 Family history of ischemic heart disease and other diseases of the circulatory system: Secondary | ICD-10-CM

## 2013-03-27 DIAGNOSIS — Z936 Other artificial openings of urinary tract status: Secondary | ICD-10-CM

## 2013-03-27 HISTORY — PX: HOLMIUM LASER APPLICATION: SHX5852

## 2013-03-27 HISTORY — PX: CYSTOSCOPY/RETROGRADE/URETEROSCOPY: SHX5316

## 2013-03-27 HISTORY — DX: Candidiasis, unspecified: B37.9

## 2013-03-27 LAB — BASIC METABOLIC PANEL
BUN: 10 mg/dL (ref 6–23)
CO2: 29 mEq/L (ref 19–32)
Calcium: 10.2 mg/dL (ref 8.4–10.5)
Chloride: 98 mEq/L (ref 96–112)
Creatinine, Ser: 0.83 mg/dL (ref 0.50–1.10)
GFR calc Af Amer: >90 mL/min (ref >90)
GFR, EST NON AFRICAN AMERICAN: 89 mL/min — AB (ref >90)
Glucose, Bld: 103 mg/dL — ABNORMAL HIGH (ref 70–99)
Potassium: 3.6 mEq/L — ABNORMAL LOW (ref 3.7–5.3)
SODIUM: 140 meq/L (ref 137–147)

## 2013-03-27 LAB — TYPE AND SCREEN
ABO/RH(D): B POS
ANTIBODY SCREEN: NEGATIVE

## 2013-03-27 LAB — CBC
HCT: 32 % — ABNORMAL LOW (ref 36.0–46.0)
Hemoglobin: 10.2 g/dL — ABNORMAL LOW (ref 12.0–15.0)
MCH: 23.6 pg — AB (ref 26.0–34.0)
MCHC: 31.9 g/dL (ref 30.0–36.0)
MCV: 73.9 fL — AB (ref 78.0–100.0)
PLATELETS: 602 10*3/uL — AB (ref 150–400)
RBC: 4.33 MIL/uL (ref 3.87–5.11)
RDW: 15.4 % (ref 11.5–15.5)
WBC: 10.6 10*3/uL — ABNORMAL HIGH (ref 4.0–10.5)

## 2013-03-27 LAB — HCG, SERUM, QUALITATIVE: Preg, Serum: NEGATIVE

## 2013-03-27 SURGERY — CYSTOSCOPY/RETROGRADE/URETEROSCOPY
Anesthesia: General | Site: Ureter | Laterality: Right

## 2013-03-27 MED ORDER — GLYCOPYRROLATE 0.2 MG/ML IJ SOLN
INTRAMUSCULAR | Status: AC
  Filled 2013-03-27: qty 3

## 2013-03-27 MED ORDER — SENNOSIDES-DOCUSATE SODIUM 8.6-50 MG PO TABS: 1.0000 | ORAL_TABLET | Freq: Every day | ORAL | Status: DC

## 2013-03-27 MED ORDER — LIDOCAINE HCL 2 % EX GEL
CUTANEOUS | Status: AC
  Filled 2013-03-27: qty 10

## 2013-03-27 MED ORDER — PROMETHAZINE HCL 25 MG/ML IJ SOLN: 6.2500 mg | INTRAMUSCULAR | Status: DC | PRN

## 2013-03-27 MED ORDER — ONDANSETRON HCL 4 MG/2ML IJ SOLN
INTRAMUSCULAR | Status: DC | PRN
Start: 2013-03-27 — End: 2013-03-27
  Administered 2013-03-27: 4 mg via INTRAVENOUS

## 2013-03-27 MED ORDER — SUCCINYLCHOLINE CHLORIDE 20 MG/ML IJ SOLN
INTRAMUSCULAR | Status: DC | PRN
  Administered 2013-03-27: 100 mg via INTRAVENOUS

## 2013-03-27 MED ORDER — HEPARIN SODIUM (PORCINE) 5000 UNIT/ML IJ SOLN
5000.0000 [IU] | Freq: Three times a day (TID) | INTRAMUSCULAR | Status: DC
  Filled 2013-03-27 (×5): qty 1

## 2013-03-27 MED ORDER — HYDROMORPHONE HCL PF 1 MG/ML IJ SOLN: 0.2500 mg | INTRAMUSCULAR | Status: DC | PRN

## 2013-03-27 MED ORDER — ONDANSETRON HCL 4 MG/2ML IJ SOLN
INTRAMUSCULAR | Status: AC
  Filled 2013-03-27: qty 2

## 2013-03-27 MED ORDER — NEOSTIGMINE METHYLSULFATE 1 MG/ML IJ SOLN
INTRAMUSCULAR | Status: DC | PRN
  Administered 2013-03-27: 4 mg via INTRAVENOUS

## 2013-03-27 MED ORDER — OXYCODONE HCL 5 MG/5ML PO SOLN
5.0000 mg | Freq: Once | ORAL | Status: DC | PRN
  Filled 2013-03-27: qty 5

## 2013-03-27 MED ORDER — BISACODYL 10 MG RE SUPP
10.0000 mg | Freq: Two times a day (BID) | RECTAL | Status: DC
  Filled 2013-03-27 (×2): qty 1

## 2013-03-27 MED ORDER — MIDAZOLAM HCL 2 MG/2ML IJ SOLN
INTRAMUSCULAR | Status: AC
  Filled 2013-03-27: qty 2

## 2013-03-27 MED ORDER — HYOSCYAMINE SULFATE 0.125 MG SL SUBL
0.1250 mg | SUBLINGUAL_TABLET | SUBLINGUAL | Status: DC | PRN
  Administered 2013-03-27: 0.125 mg via SUBLINGUAL
  Filled 2013-03-27: qty 1

## 2013-03-27 MED ORDER — OXYCODONE HCL 5 MG PO TABS: 5.0000 mg | ORAL_TABLET | Freq: Once | ORAL | Status: DC | PRN

## 2013-03-27 MED ORDER — PROPOFOL 10 MG/ML IV BOLUS
INTRAVENOUS | Status: AC
  Filled 2013-03-27: qty 20

## 2013-03-27 MED ORDER — SODIUM CHLORIDE 0.9 % IR SOLN
Status: DC | PRN
  Administered 2013-03-27: 7000 mL

## 2013-03-27 MED ORDER — FENTANYL CITRATE 0.05 MG/ML IJ SOLN
INTRAMUSCULAR | Status: AC
Start: 2013-03-27 — End: 2013-03-27
  Filled 2013-03-27: qty 5

## 2013-03-27 MED ORDER — SODIUM CHLORIDE 0.9 % IV SOLN
2.0000 g | INTRAVENOUS | Status: AC
  Administered 2013-03-27: 2 g via INTRAVENOUS
  Filled 2013-03-27: qty 2000

## 2013-03-27 MED ORDER — BISACODYL 10 MG RE SUPP
10.0000 mg | Freq: Once | RECTAL | Status: AC
  Administered 2013-03-27: 10 mg via RECTAL

## 2013-03-27 MED ORDER — LACTATED RINGERS IV SOLN
INTRAVENOUS | Status: DC | PRN
  Administered 2013-03-27 (×2): via INTRAVENOUS

## 2013-03-27 MED ORDER — LACTATED RINGERS IV SOLN: INTRAVENOUS | Status: DC

## 2013-03-27 MED ORDER — SENNOSIDES-DOCUSATE SODIUM 8.6-50 MG PO TABS
1.0000 | ORAL_TABLET | Freq: Two times a day (BID) | ORAL | Status: DC
Start: 2013-03-27 — End: 2013-03-28
  Administered 2013-03-27: 1 via ORAL
  Filled 2013-03-27 (×3): qty 1

## 2013-03-27 MED ORDER — CISATRACURIUM BESYLATE 20 MG/10ML IV SOLN
INTRAVENOUS | Status: AC
  Filled 2013-03-27: qty 10

## 2013-03-27 MED ORDER — LIDOCAINE HCL (CARDIAC) 20 MG/ML IV SOLN
INTRAVENOUS | Status: DC | PRN
  Administered 2013-03-27: 100 mg via INTRAVENOUS

## 2013-03-27 MED ORDER — LIDOCAINE HCL (CARDIAC) 20 MG/ML IV SOLN
INTRAVENOUS | Status: AC
  Filled 2013-03-27: qty 5

## 2013-03-27 MED ORDER — MEPERIDINE HCL 50 MG/ML IJ SOLN: 6.2500 mg | INTRAMUSCULAR | Status: DC | PRN

## 2013-03-27 MED ORDER — CEFAZOLIN SODIUM 1-5 GM-% IV SOLN
1.0000 g | Freq: Three times a day (TID) | INTRAVENOUS | Status: AC
  Administered 2013-03-27 – 2013-03-28 (×2): 1 g via INTRAVENOUS
  Filled 2013-03-27 (×2): qty 50

## 2013-03-27 MED ORDER — CISATRACURIUM BESYLATE (PF) 10 MG/5ML IV SOLN
INTRAVENOUS | Status: DC | PRN
  Administered 2013-03-27: 3 mg via INTRAVENOUS
  Administered 2013-03-27 (×2): 2 mg via INTRAVENOUS
  Administered 2013-03-27: 6 mg via INTRAVENOUS

## 2013-03-27 MED ORDER — FENTANYL CITRATE 0.05 MG/ML IJ SOLN
INTRAMUSCULAR | Status: DC | PRN
  Administered 2013-03-27: 50 ug via INTRAVENOUS
  Administered 2013-03-27: 100 ug via INTRAVENOUS
  Administered 2013-03-27 (×2): 50 ug via INTRAVENOUS

## 2013-03-27 MED ORDER — HYDROMORPHONE HCL PF 1 MG/ML IJ SOLN
0.5000 mg | INTRAMUSCULAR | Status: DC | PRN
  Administered 2013-03-27: 1 mg via INTRAVENOUS
  Filled 2013-03-27: qty 1

## 2013-03-27 MED ORDER — GLYCOPYRROLATE 0.2 MG/ML IJ SOLN
INTRAMUSCULAR | Status: DC | PRN
  Administered 2013-03-27: 0.6 mg via INTRAVENOUS

## 2013-03-27 MED ORDER — BELLADONNA ALKALOIDS-OPIUM 16.2-60 MG RE SUPP
RECTAL | Status: AC
  Filled 2013-03-27: qty 1

## 2013-03-27 MED ORDER — SODIUM CHLORIDE 0.9 % IV SOLN
INTRAVENOUS | Status: DC
  Administered 2013-03-27 – 2013-03-28 (×2): via INTRAVENOUS

## 2013-03-27 MED ORDER — MIDAZOLAM HCL 5 MG/5ML IJ SOLN
INTRAMUSCULAR | Status: DC | PRN
  Administered 2013-03-27: 2 mg via INTRAVENOUS

## 2013-03-27 MED ORDER — BELLADONNA ALKALOIDS-OPIUM 16.2-60 MG RE SUPP
RECTAL | Status: DC | PRN
  Administered 2013-03-27: 1 via RECTAL

## 2013-03-27 MED ORDER — PROPOFOL 10 MG/ML IV BOLUS
INTRAVENOUS | Status: DC | PRN
  Administered 2013-03-27: 150 mg via INTRAVENOUS

## 2013-03-27 MED ORDER — ONDANSETRON HCL 4 MG/2ML IJ SOLN: 4.0000 mg | INTRAMUSCULAR | Status: DC | PRN

## 2013-03-27 MED ORDER — DEXAMETHASONE SODIUM PHOSPHATE 10 MG/ML IJ SOLN
INTRAMUSCULAR | Status: AC
  Filled 2013-03-27: qty 1

## 2013-03-27 MED ORDER — HYDROMORPHONE HCL 2 MG PO TABS
2.0000 mg | ORAL_TABLET | ORAL | Status: DC | PRN
  Administered 2013-03-27: 2 mg via ORAL
  Filled 2013-03-27: qty 1

## 2013-03-27 MED ORDER — VITAMINS A & D EX OINT
TOPICAL_OINTMENT | CUTANEOUS | Status: AC
  Administered 2013-03-28: 04:00:00
  Filled 2013-03-27: qty 5

## 2013-03-27 MED ORDER — DEXAMETHASONE SODIUM PHOSPHATE 10 MG/ML IJ SOLN
INTRAMUSCULAR | Status: DC | PRN
  Administered 2013-03-27: 10 mg via INTRAVENOUS

## 2013-03-27 SURGICAL SUPPLY — 62 items
BAG URINE DRAINAGE (UROLOGICAL SUPPLIES) IMPLANT
BAG URO CATCHER STRL LF (DRAPE) ×4 IMPLANT
BASKET STONE NCOMPASS (UROLOGICAL SUPPLIES) ×4 IMPLANT
BASKET STONE NITINOL 3FRX115MB (UROLOGICAL SUPPLIES) IMPLANT
BASKET ZERO TIP NITINOL 2.4FR (BASKET) IMPLANT
BENZOIN TINCTURE PRP APPL 2/3 (GAUZE/BANDAGES/DRESSINGS) IMPLANT
CATH FOLEY 2W COUNCIL 20FR 5CC (CATHETERS) IMPLANT
CATH FOLEY 2WAY SLVR  5CC 18FR (CATHETERS)
CATH FOLEY 2WAY SLVR 5CC 18FR (CATHETERS) IMPLANT
CATH INTERMIT  6FR 70CM (CATHETERS) IMPLANT
CATH ROBINSON RED A/P 14FR (CATHETERS) ×4 IMPLANT
CATH ROBINSON RED A/P 20FR (CATHETERS) IMPLANT
CATH URET 5FR 28IN OPEN ENDED (CATHETERS) ×4 IMPLANT
CATH URET DUAL LUMEN 6-10FR 50 (CATHETERS) ×4 IMPLANT
CATH X-FORCE N30 NEPHROSTOMY (TUBING) IMPLANT
COVER SURGICAL LIGHT HANDLE (MISCELLANEOUS) IMPLANT
DRAPE C-ARM 42X120 X-RAY (DRAPES) ×4 IMPLANT
DRAPE CAMERA CLOSED 9X96 (DRAPES) ×4 IMPLANT
DRAPE LG THREE QUARTER DISP (DRAPES) ×16 IMPLANT
DRAPE LINGEMAN PERC (DRAPES) IMPLANT
DRAPE SURG IRRIG POUCH 19X23 (DRAPES) IMPLANT
DRAPE UTILITY XL STRL (DRAPES) IMPLANT
DRSG TEGADERM 8X12 (GAUZE/BANDAGES/DRESSINGS) IMPLANT
FIBER LASER FLEXIVA 200 (UROLOGICAL SUPPLIES) ×4 IMPLANT
FIBER LASER FLEXIVA 550 (UROLOGICAL SUPPLIES) IMPLANT
GLOVE BIOGEL M 7.0 STRL (GLOVE) ×8 IMPLANT
GLOVE BIOGEL PI IND STRL 7.5 (GLOVE) ×2 IMPLANT
GLOVE BIOGEL PI INDICATOR 7.5 (GLOVE) ×2
GLOVE ECLIPSE 7.0 STRL STRAW (GLOVE) IMPLANT
GOWN STRL REUS W/TWL LRG LVL3 (GOWN DISPOSABLE) ×4 IMPLANT
GOWN STRL REUS W/TWL XL LVL3 (GOWN DISPOSABLE) ×4 IMPLANT
GUIDEWIRE ANG ZIPWIRE 038X150 (WIRE) IMPLANT
GUIDEWIRE STR DUAL SENSOR (WIRE) ×12 IMPLANT
KIT BASIN OR (CUSTOM PROCEDURE TRAY) ×4 IMPLANT
LASER FIBER DISP 1000U (UROLOGICAL SUPPLIES) IMPLANT
MANIFOLD NEPTUNE II (INSTRUMENTS) ×4 IMPLANT
NS IRRIG 1000ML POUR BTL (IV SOLUTION) IMPLANT
PACK BASIC VI WITH GOWN DISP (CUSTOM PROCEDURE TRAY) IMPLANT
PACK CYSTO (CUSTOM PROCEDURE TRAY) ×4 IMPLANT
PAD ABD 7.5X8 STRL (GAUZE/BANDAGES/DRESSINGS) IMPLANT
PROBE LITHOCLAST ULTRA 3.8X403 (UROLOGICAL SUPPLIES) IMPLANT
PROBE PNEUMATIC 1.0MMX570MM (UROLOGICAL SUPPLIES) IMPLANT
SCRUB PCMX 4 OZ (MISCELLANEOUS) ×4 IMPLANT
SET IRRIG Y TYPE TUR BLADDER L (SET/KITS/TRAYS/PACK) ×4 IMPLANT
SET WARMING FLUID IRRIGATION (MISCELLANEOUS) IMPLANT
SHEATH URET 14/16 FRX35CM (MISCELLANEOUS) ×4 IMPLANT
SPONGE GAUZE 4X4 12PLY (GAUZE/BANDAGES/DRESSINGS) IMPLANT
SPONGE LAP 4X18 X RAY DECT (DISPOSABLE) IMPLANT
STENT CONTOUR 6FRX24X.038 (STENTS) ×4 IMPLANT
STENT ENDOURETEROTOMY 7-14 26C (STENTS) IMPLANT
STONE CATCHER W/TUBE ADAPTER (UROLOGICAL SUPPLIES) IMPLANT
SUT SILK 2 0 30  PSL (SUTURE)
SUT SILK 2 0 30 PSL (SUTURE) IMPLANT
SYR 20CC LL (SYRINGE) IMPLANT
SYRINGE 10CC LL (SYRINGE) ×4 IMPLANT
SYSTEM UROSTOMY GENTLE TOUCH (WOUND CARE) ×4 IMPLANT
TOWEL OR NON WOVEN STRL DISP B (DISPOSABLE) ×4 IMPLANT
TRAY FOLEY CATH 14FRSI W/METER (CATHETERS) IMPLANT
TUBING CONNECTING 10 (TUBING) ×3 IMPLANT
TUBING CONNECTING 10' (TUBING) ×1
WATER STERILE IRR 1500ML POUR (IV SOLUTION) IMPLANT
WIRE COONS/BENSON .038X145CM (WIRE) ×4 IMPLANT

## 2013-03-27 NOTE — H&P (Signed)
Urology History and Physical Exam  CC: Right staghorn renal stone.  HPI: 38 year old female presents today for right staghorn renal stone. This is a chronic problem. She Beckey Downing Artie had a right percutaneous nephrostolithotomy last week as a first stage procedure. She was scheduled to have her second stage procedure last week, but she began to have tachycardia and fever following placement of a second percutaneous access by interventional radiology. She was rescheduled today. Blood and urine cultures following her episodes of fever and tachycardia were negative for growth. She is been taking Augmentin since discharge home. Her stone is large. It measures 5.3 cm in greatest dimension. It involves the renal pelvis as well as the lower, middle, and upper calyces. Previous lower pole access was inadequate for removal of stones in the middle and upper pole calyces do to difficult internal anatomy. She has had a second 13 access placed by interventional radiology last week in what appears to be a mid posterior calyx. I'm still concerned that this may not be adequate for accessing the majority of the stone, and therefore I recommend starting off with ureteroscopy today. We will also plan on doing 13 dorsolithotomy of needed. We discussed the risk and benefits of dilating the second nephrostomy tract. She is typed and screened for blood. We discussed the risk and benefits, alternatives, and likelihood of achieving goals.  PMH: Past Medical History  Diagnosis Date  . Ectopic pregnancy   . Hypertension     hx pre eclampsia with last preg.  . Tendonitis     rt hand  . Eczema 03-17-13    none apparent at this time  . Shortness of breath     occasionally with exertion ,03-17-13 resolved at this time  . Kidney stone     hydronephrosis due to kidney stone right, nephrostomy tube placed right and remains  . Yeast infection     current- 03/25/13- has notified Dr Margarita Grizzle. per patient   . Thrush, oral      PSH: Past Surgical History  Procedure Laterality Date  . Ectopic pregnancy surgery  2005  . Cesarean section with bilateral tubal ligation Right 10/18/2012    Procedure: REPEAT CESAREAN SECTION WITH BILATERAL TUBAL LIGATION;  Surgeon: Oliver Pila, MD;  Location: [facility];  Service: Obstetrics;  Laterality: Right; TOTAL OF 4 C SECTIONS  . Nephrolithotomy Right 03/03/2013    Procedure: Cystoscopy, Right Ureteroscopy with holmium laser With Strent;  Surgeon: Milford Cage, MD;  Location: [facility];  Service: Urology;  Laterality: Right;  . Holmium laser application Right 03/03/2013    Procedure: HOLMIUM LASER APPLICATION;  Surgeon: Milford Cage, MD;  Location: [facility];  Service: Urology;  Laterality: Right;  . Percutaneous nephrostomy Right   . Nephrolithotomy Right 03/18/2013    Procedure: RIGHT NEPHROLITHOTOMY PERCUTANEOUS   (1 STAGE) WITH HOLMIUM LASER;  Surgeon: Milford Cage, MD;  Location: [facility];  Service: Urology;  Laterality: Right;  . Holmium laser application Right 03/18/2013    Procedure: HOLMIUM LASER APPLICATION;  Surgeon: Milford Cage, MD;  Location: [facility];  Service: Urology;  Laterality: Right;    Allergies: No Known Allergies  Medications: Prescriptions prior to admission  Medication Sig Dispense Refill  . amoxicillin-clavulanate (AUGMENTIN) 875-125 MG per tablet Take 1 tablet by mouth 2 (two) times daily.  12 tablet  0  . HYDROmorphone (DILAUDID) 2 MG tablet Take 1-2 tablets (2-4 mg total) by mouth every 4 (four) hours as needed for severe pain.  60 tablet  0  . hyoscyamine (LEVSIN SL) 0.125 MG SL tablet Place 1 tablet (0.125 mg total) under the tongue every 4 (four) hours as needed (bladder spasms).  40 tablet  4  . senna-docusate (SENOKOT S) 8.6-50 MG per tablet Take 1 tablet by mouth daily.  60 tablet  0     Social History: History   Social History  . Marital Status: Single    Spouse Name: N/A    Number of Children: N/A  .  Years of Education: N/A   Occupational History  . Not on file.   Social History Main Topics  . Smoking status: Light Tobacco Smoker -- 10 years  . Smokeless tobacco: Not on file     Comment: patient states she only smokes 1 cigarette a day- plans to quit  . Alcohol Use: Yes     Comment: occasional -rare  . Drug Use: No  . Sexual Activity: Yes   Other Topics Concern  . Not on file   Social History Narrative  . No narrative on file    Family History: Family History  Problem Relation Age of Onset  . Diabetes type II Mother   . Hypertension Mother   . Diabetes type II Sister     Review of Systems: Positive: Right flank pain. Negative: Fever, SOB, or chest pain.  A further 10 point review of systems was negative except what is listed in the HPI.  Physical Exam: Filed Vitals:   03/27/13 0826  BP: 143/77  Pulse: 76  Temp: 97.8 F (36.6 C)  Resp: 18    General: No acute distress.  Awake. Head:  Normocephalic.  Atraumatic. ENT:  EOMI.  Mucous membranes moist Neck:  Supple.  No lymphadenopathy. CV:  S1 present. S2 present. Regular rate. Pulmonary: Equal effort bilaterally.  Clear to auscultation bilaterally. Abdomen: Soft.  Non- tender to palpation. Skin:  Normal turgor.  No visible rash. Extremity: No gross deformity of bilateral upper extremities.  No gross deformity of    bilateral lower extremities. Neurologic: Alert. Appropriate mood.   Studies:  Recent Labs     03/27/13  0915  HGB  10.2*  WBC  10.6*  PLT  602*    Recent Labs     03/27/13  0915  NA  140  K  3.6*  CL  98  CO2  29  BUN  10  CREATININE  0.83  CALCIUM  10.2  GFRNONAA  89*  GFRAA  >90     No results found for this basename: PT, INR, APTT,  in the last 72 hours   No components found with this basename: ABG,     Assessment:  Right staghorn renal stone (>2 cm)  Plan: To OR for cystoscopy, right ureteroscopy, laser lithotripsy, right second look PCNL, possible dilation of  right nephrostomy tract site.

## 2013-03-27 NOTE — Transfer of Care (Signed)
Immediate Anesthesia Transfer of Care Note  Patient: [name]  Procedure(s) Performed: Procedure(s) (LRB): CYSTOSCOPY RIGHT URETEROSCOPY LASER LITHOTRIPSY  RIGHT RETROGRADE PYELOGRAM STENT PLACEMENT ON RIGHT (Right) HOLMIUM LASER APPLICATION (Right)  Patient Location: PACU  Anesthesia Type: General  Level of Consciousness: sedated, patient cooperative and responds to stimulation  Airway & Oxygen Therapy: Patient Spontanous Breathing and Patient connected to face mask oxgen  Post-op Assessment: Report given to PACU RN and Post -op Vital signs reviewed and stable  Post vital signs: Reviewed and stable  Complications: No apparent anesthesia complications

## 2013-03-27 NOTE — Op Note (Signed)
Urology Operative Report  Date of Procedure: 03/27/13  Surgeon: Natalia Leatherwood, MD Assistant:  None  Preoperative Diagnosis: Right staghorn kidney stone. Postoperative Diagnosis:  Same  Procedure(s): Right percutaneous nephrolithotmy (>2 cm; 2nd stage). Right ureteroscopy with laser lithotripsy and stone removal. Right ureter stent placement (6 x 24, no tether). Cystoscopy. Fluoroscopy (<1 hour).  Estimated blood loss: Minimal  Specimen: Stones provided to patient.  Drains: None  Complications: None  Findings: Large renal stone burden. Dilated right ureter.  History of present illness: Patient presents for a large staghorn renal calculus. The stone was greater than 4 cm in size in fact it was larger than 5 cm in size. She first age-predicted nephrostolithotomy last week. She was scheduled for second stage but this was canceled due to high fevers and tachycardia. This resolved and urine blood cultures were negative. She returns today for second look percutaneous nephrostolithotomy.   Procedure in detail: After informed consent was obtained, the patient was taken to the operating room. They were placed in the supine position. SCDs were turned on and in place. IV antibiotics were infused, and general anesthesia was induced. A timeout was performed in which the correct patient, surgical site, and procedure were identified and agreed upon by the team.  The patient was placed in a dorsolithotomy position, making sure to pad all pertinent neurovascular pressure points. Two gel rolls were placed under her right gluteus and right flank in order to expose her right nephrostomy tubes. The genitals and right flank were prepped and draped in the usual sterile fashion.  I began with ureteroscopy in order to assess the new access site and the remaining stone burden. A cystoscope was advanced through the urethra and the bladder. Bladder was drained. Attention was turned the right ureter orifice.  2 nephroureteral tubes were emanating from the right ureter orifice and into the bladder. One was grasped and brought urethra meatus. A sensor wire was placed through this tube and out the other in into the sterile field of her right flank. The nephroureteral catheter was then removed from the right flank leaving the wire in place. A sensor wire was then placed from the flank position into grade and into the bladder and out the urethra. The nephroureteral catheter for this was also removed from the flank position. These wires were then secured to the drape a safety wires.  A flexible digital ureter scope was then placed through the urethra, into the bladder, and up the ureter with ease. This is noted to be very dilated. The calyces were evaluated in a systematic fashion. The majority the stone burden was in the upper and mid poles. The access site was identified and this was found to be in the mid pole, but accessing the stones from the flank position in this new site would be difficult due to the acute angle at which the stone to be located.   good visualization therefore elected to carry out lithotripsy with a 200  holmium laser filament at a setting of 0.5 J and 20 Hz. The stones were broken into smaller fragments. I also accessed the stones for lithotripsy with the laser passing the ureteroscope to the flank position. I was able to do this by removing the right nephrostomy tube, and placing a dual-lumen ureter access sheath over one of the safety wires. I then placed a Benson wire down the other access site under fluoroscopy into the proximal ureter. I then placed a 14-16 ureter access sheath under fluoroscopy over the  Benson wire and into the kidney. The obturator and wire were then removed. Ureter scope was advanced through this area and lithotripsy was carried out with holmium laser filament.  I then retrieved stones depending on their location in size through the nephrostomy access site or via the  ureter. Abdomen working for several hours and there were still large stone volume. All the stones were smaller and I was able to clear out the stones which would require percutaneous access. Remaining stone burden could be removed with ureteroscopy in the future. Though this might require 2 additional ureteroscopy stitches a large stone burden. There was no further obstruction from stone of any of the calyces.  I then placed the ureter scope in a retrograde fashion into the upper pole which obtained the majority of the stone burden. All the access wire was then removed. A sensor wire was then placed through the ureter scope and the scope was removed.  I then loaded a cystoscope over this wire and placed a 6 x 24 double-J ureter stent without tether under fluoroscopy. There was a good curl in the renal upper calyx and a curl in the bladder.  Stones were then removed from the flank access site and irrigated out of the bladder. These were provided to the patient.  Her bladder was drained and a belladonna and opium suppository was placed into the rectum. She's placed back in a supine position, anesthesia was reversed, she was taken to the PACU in a stable condition. A urostomy device was placed over the right nephrostomy tube site.  All counts were correct at the end of the case.  To be admitted overnight given the fact that previous limitations of her stone the rectal to the end fever and tachycardia.

## 2013-03-27 NOTE — Anesthesia Preprocedure Evaluation (Signed)
Anesthesia Evaluation  Patient identified by MRN, date of birth, ID band Patient awake    Reviewed: Allergy & Precautions, H&P , NPO status , Patient's Chart, lab work & pertinent test results, reviewed documented beta blocker date and time   Airway Mallampati: II TM Distance: >3 FB Neck ROM: full    Dental no notable dental hx. (+) Teeth Intact, Dental Advisory Given   Pulmonary shortness of breath and with exertion, Current Smoker,  breath sounds clear to auscultation  Pulmonary exam normal       Cardiovascular Exercise Tolerance: Good hypertension, Pt. on medications Rhythm:regular Rate:Normal     Neuro/Psych negative neurological ROS  negative psych ROS   GI/Hepatic negative GI ROS, Neg liver ROS,   Endo/Other  negative endocrine ROS  Renal/GU   negative genitourinary   Musculoskeletal   Abdominal   Peds  Hematology Platelets 700K   Anesthesia Other Findings   Reproductive/Obstetrics negative OB ROS                           Anesthesia Physical  Anesthesia Plan  ASA: III  Anesthesia Plan: General   Post-op Pain Management:    Induction: Intravenous  Airway Management Planned: Oral ETT  Additional Equipment:   Intra-op Plan:   Post-operative Plan: Extubation in OR  Informed Consent: I have reviewed the patients History and Physical, chart, labs and discussed the procedure including the risks, benefits and alternatives for the proposed anesthesia with the patient or authorized representative who has indicated his/her understanding and acceptance.   Dental Advisory Given  Plan Discussed with: CRNA  Anesthesia Plan Comments:         Anesthesia Quick Evaluation

## 2013-03-27 NOTE — Anesthesia Postprocedure Evaluation (Signed)
Anesthesia Post Note  Patient: [name]  Procedure(s) Performed: Procedure(s) (LRB): CYSTOSCOPY RIGHT URETEROSCOPY LASER LITHOTRIPSY  RIGHT RETROGRADE PYELOGRAM STENT PLACEMENT ON RIGHT (Right) HOLMIUM LASER APPLICATION (Right)  Anesthesia type: General  Patient location: PACU  Post pain: Pain level controlled  Post assessment: Post-op Vital signs reviewed  Last Vitals: BP 142/73  Pulse 72  Temp(Src) 36.8 C (Oral)  Resp 16  Ht 5' (1.524 m)  Wt 168 lb 2 oz (76.261 kg)  BMI 32.83 kg/m2  SpO2 100%  LMP 03/03/2013  Breastfeeding? No  Post vital signs: Reviewed  Level of consciousness: sedated  Complications: No apparent anesthesia complications

## 2013-03-28 ENCOUNTER — Encounter (HOSPITAL_COMMUNITY): Payer: Self-pay | Admitting: Urology

## 2013-03-28 ENCOUNTER — Other Ambulatory Visit: Payer: Self-pay | Admitting: Urology

## 2013-03-28 NOTE — Progress Notes (Signed)
Urology Progress Note  Subjective:     No acute urologic events overnight. Pain controlled.  ROS: Negative: chest pain or SOB.  Objective:  Patient Vitals for the past 24 hrs:  BP Temp Temp src Pulse Resp SpO2 Height Weight  03/28/13 0649 120/67 mmHg 98.1 F (36.7 C) Oral 65 18 100 % - -  03/27/13 2102 - - - - - 100 % - -  03/27/13 2043 133/83 mmHg 98.2 F (36.8 C) Oral 81 16 100 % - -  03/27/13 1650 142/73 mmHg 98.2 F (36.8 C) - 72 16 100 % 5' (1.524 m) 76.261 kg (168 lb 2 oz)  03/27/13 1630 138/83 mmHg - - 80 19 100 % - -  03/27/13 1625 - 98.1 F (36.7 C) - 80 17 100 % - -  03/27/13 1615 135/77 mmHg - - 77 17 100 % - -  03/27/13 1600 139/84 mmHg 98.1 F (36.7 C) - 76 13 100 % - -  03/27/13 1545 136/79 mmHg - - 76 16 100 % - -  03/27/13 1530 140/80 mmHg - - 100 19 100 % - -  03/27/13 1524 - 97.5 F (36.4 C) - 90 16 100 % - -  03/27/13 1522 126/105 mmHg - - - - - - -  03/27/13 0826 143/77 mmHg 97.8 F (36.6 C) Oral 76 18 100 % 5' (1.524 m) 76.261 kg (168 lb 2 oz)    Physical Exam: General:  No acute distress, awake Cardiovascular:    [x]   S1/S2 present, RRR  []   Irregularly irregular Chest:  CTA-B Abdomen:               []  Soft, appropriately TTP  [x]  Soft, NTTP  []  Soft, appropriately TTP, incision(s) clean/dry/intact  Genitourinary: Nephrostomy site with scant urine drainage. Foley:  None    I/O last 3 completed shifts: In: 2087.5 [P.O.:240; I.V.:1797.5; IV Piggyback:50] Out: 2325 [Urine:2325]  Recent Labs     03/27/13  0915  HGB  10.2*  WBC  10.6*  PLT  602*    Recent Labs     03/27/13  0915  NA  140  K  3.6*  CL  98  CO2  29  BUN  10  CREATININE  0.83  CALCIUM  10.2  GFRNONAA  89*  GFRAA  >90     No results found for this basename: PT, INR, APTT,  in the last 72 hours   No components found with this basename: ABG,     Length of stay: 1 days.  Assessment: Right staghorn renal stone POD#1 2nd look PCNL & right  ureteroscopy.   Plan: -D/c home. -States she has augmentin & pain meds at home. -We discussed remaining stone burden and I recommend right ureteroscopy.  We discussed risks, benefits, side effects, and likelihood of achieving levels.  We will start scheduling her for this.  Follow up with me next week as scheduled.   Natalia Leatherwoodaniel Landin Tallon, MD [phone]

## 2013-03-28 NOTE — Discharge Instructions (Signed)
DISCHARGE INSTRUCTIONS FOR PCNL  ° °MEDICATIONS:  °1. Resume all your other meds from home. ° ° °ACTIVITY °1. No strenuous activity, sexual activity, or lifting greater than 10 pounds for 3 weeks. °2. No driving while on narcotic pain medications °3. Drink plenty of water °4. Continue to walk at home - you can still get blood clots when you are at home, so keep active, but don't over do it. °5. May return to work in 1 week (but not heavy or strenuous activity).  ° °BATHING °1. You can shower and we recommend daily showers.  Cover your wound with a dressing and remove the dressing immediately after the shower.  Do not submerge wound under water.  ° °WOUND CARE °Your wound will drain bloody fluid and may do so for 7-14 days. You have 2 options for dressings:   ° °1. You may use kerlex (rolled up gauze) and tape to dress your wound.  If you choose this method, then change the dressing as it becomes soaked.  Change it at least once daily until it stops draining. ° °2. You may use and ostomy device.  This is a bag with an andhesive circle.  The circle has a hole in the middle of it and you cut the hole to the size needed to fit the wound.  This will collect the drainage in the bag and allow you to drain the bag as needed.  ° ° °SIGNS/SYMPTOMS TO CALL: °1. Please call us if you have a fever greater than 101.5, uncontrolled nausea/vomiting, uncontrolled pain, dizziness, unable to urinate, chest pain, shortness of breath, leg swelling, leg pain, redness around wound, drainage from wound, or any other concerns or questions. °2. You can reach us at [phone]. °

## 2013-03-28 NOTE — Discharge Summary (Signed)
Physician Discharge Summary  Patient ID: Eulis Cannervelin M Kauer MRN: [id number] DOB/AGE: September 26, 1975 38 y.o.  Admit date: 03/27/2013 Discharge date: 03/28/2013  Admission Diagnoses: Right staghorn kidney stone.  Discharge Diagnoses:  Active Problems:   Staghorn calculus   Discharged Condition: good  Hospital Course:  This patient was admitted following a second look right percutaneous nephrostolithotomy and right ureteroscopy for stone burden greater than 4 cm.  She previously had problems with fever and tachycardia following manipulation of her stone.  She had a very successful surgery although she still had a large amount of stone burden remaining.  None of that was obstructing her kidney.  Well overnight without fever or tachycardia.  Pain was well-controlled.  All of her percutaneous drains and lines were able to be removed following the surgery as well as her Foley catheter.  She only has a right ureter stent in place.  We discussed the remaining stone burden and I recommended repeating ureteroscopy.  I explained that this will take anywhere from 1-2 additional surgeries.  She would like to go ahead and try to start scheduling this.  She states she has plenty of Augmentin and pain medication at home.  She has a urostomy device over her percutaneous site which she was instructed to remove once there is no further drainage.  Consults: None  Significant Diagnostic Studies: None  Treatments: IV hydration, antibiotics: gentamycin and ampicillin and surgery: Right 2nd look percutaneous nephrostolithotomy and right ureteroscopy.  Discharge Exam: Blood pressure 120/67, pulse 65, temperature 98.1 F (36.7 C), temperature source Oral, resp. rate 18, height 5' (1.524 m), weight 76.261 kg (168 lb 2 oz), last menstrual period 03/03/2013, SpO2 100.00%, not currently breastfeeding. Refer to PE from date of discharge.  Disposition: Home- self care.  Discharge Orders   Future Orders Complete By Expires    Discharge patient  As directed        Medication List         amoxicillin-clavulanate 875-125 MG per tablet  Commonly known as:  AUGMENTIN  Take 1 tablet by mouth 2 (two) times daily.     HYDROmorphone 2 MG tablet  Commonly known as:  DILAUDID  Take 1-2 tablets (2-4 mg total) by mouth every 4 (four) hours as needed for severe pain.     hyoscyamine 0.125 MG SL tablet  Commonly known as:  LEVSIN SL  Place 1 tablet (0.125 mg total) under the tongue every 4 (four) hours as needed (bladder spasms).     senna-docusate 8.6-50 MG per tablet  Commonly known as:  SENOKOT S  Take 1 tablet by mouth daily.           Follow-up Information   Follow up with Milford CageWoodruff, Shanaiya Bene Young, MD On 04/03/2013. (9:15 am)    Specialty:  Urology   Contact information:   [address] [facility]  [place] [address] [phone]       Signed: Milford CageWoodruff, Vernita Tague Young 03/28/2013, 8:33 AM

## 2013-04-01 ENCOUNTER — Encounter (HOSPITAL_BASED_OUTPATIENT_CLINIC_OR_DEPARTMENT_OTHER): Payer: Self-pay | Admitting: *Deleted

## 2013-04-01 NOTE — Progress Notes (Signed)
NPO AFTER MN WITH EXCEPTION WATER / GATORADE UNTIL 0630.  ARRIVE AT 1100. CURRENT LAB RESULTS IN EPIC AND CHART. MAY TAKE DILAUDID/ LEVSIN IF NEEDED AM DOS W/ SIPS OF WATER.

## 2013-04-09 ENCOUNTER — Ambulatory Visit (HOSPITAL_BASED_OUTPATIENT_CLINIC_OR_DEPARTMENT_OTHER)
Admission: RE | Admit: 2013-04-09 | Discharge: 2013-04-09 | Disposition: A | Discharge status: Home / Self Care | Payer: Medicaid Other | Source: Ambulatory Visit | Attending: Urology | Admitting: Urology

## 2013-04-09 ENCOUNTER — Encounter (HOSPITAL_BASED_OUTPATIENT_CLINIC_OR_DEPARTMENT_OTHER)
Admission: RE | Disposition: A | Discharge status: Home / Self Care | Payer: Self-pay | Source: Ambulatory Visit | Attending: Urology

## 2013-04-09 ENCOUNTER — Encounter (HOSPITAL_BASED_OUTPATIENT_CLINIC_OR_DEPARTMENT_OTHER): Payer: Medicaid Other | Admitting: Anesthesiology

## 2013-04-09 ENCOUNTER — Ambulatory Visit (HOSPITAL_BASED_OUTPATIENT_CLINIC_OR_DEPARTMENT_OTHER): Payer: Medicaid Other | Admitting: Anesthesiology

## 2013-04-09 ENCOUNTER — Encounter (HOSPITAL_BASED_OUTPATIENT_CLINIC_OR_DEPARTMENT_OTHER): Payer: Self-pay | Admitting: Anesthesiology

## 2013-04-09 DIAGNOSIS — R0602 Shortness of breath: Secondary | ICD-10-CM | POA: Insufficient documentation

## 2013-04-09 DIAGNOSIS — M65839 Other synovitis and tenosynovitis, unspecified forearm: Secondary | ICD-10-CM | POA: Insufficient documentation

## 2013-04-09 DIAGNOSIS — L259 Unspecified contact dermatitis, unspecified cause: Secondary | ICD-10-CM | POA: Insufficient documentation

## 2013-04-09 DIAGNOSIS — N200 Calculus of kidney: Secondary | ICD-10-CM

## 2013-04-09 DIAGNOSIS — Z9889 Other specified postprocedural states: Secondary | ICD-10-CM | POA: Insufficient documentation

## 2013-04-09 DIAGNOSIS — Z87891 Personal history of nicotine dependence: Secondary | ICD-10-CM | POA: Insufficient documentation

## 2013-04-09 DIAGNOSIS — I10 Essential (primary) hypertension: Secondary | ICD-10-CM | POA: Insufficient documentation

## 2013-04-09 DIAGNOSIS — M65849 Other synovitis and tenosynovitis, unspecified hand: Secondary | ICD-10-CM

## 2013-04-09 HISTORY — DX: Calculus of kidney: N20.0

## 2013-04-09 HISTORY — DX: Other enthesopathies, not elsewhere classified: M77.8

## 2013-04-09 HISTORY — DX: Personal history of urinary calculi: Z98.890

## 2013-04-09 HISTORY — DX: Personal history of urinary calculi: Z87.442

## 2013-04-09 HISTORY — PX: CYSTOSCOPY WITH RETROGRADE PYELOGRAM, URETEROSCOPY AND STENT PLACEMENT: SHX5789

## 2013-04-09 HISTORY — PX: HOLMIUM LASER APPLICATION: SHX5852

## 2013-04-09 SURGERY — CYSTOURETEROSCOPY, WITH RETROGRADE PYELOGRAM AND STENT INSERTION
Anesthesia: General | Site: Ureter | Laterality: Right

## 2013-04-09 MED ORDER — HYDROCODONE-ACETAMINOPHEN 5-325 MG PO TABS: 1.0000 | ORAL_TABLET | Freq: Four times a day (QID) | ORAL | Status: DC | PRN

## 2013-04-09 MED ORDER — IOHEXOL 350 MG/ML SOLN
INTRAVENOUS | Status: DC | PRN
  Administered 2013-04-09: 14 mL

## 2013-04-09 MED ORDER — LACTATED RINGERS IV SOLN
INTRAVENOUS | Status: DC
  Administered 2013-04-09: 12:00:00 via INTRAVENOUS
  Filled 2013-04-09: qty 1000

## 2013-04-09 MED ORDER — CEFAZOLIN SODIUM-DEXTROSE 2-3 GM-% IV SOLR
INTRAVENOUS | Status: DC | PRN
  Administered 2013-04-09: 2 g via INTRAVENOUS

## 2013-04-09 MED ORDER — DEXAMETHASONE SODIUM PHOSPHATE 4 MG/ML IJ SOLN
INTRAMUSCULAR | Status: DC | PRN
  Administered 2013-04-09: 10 mg via INTRAVENOUS

## 2013-04-09 MED ORDER — ACETAMINOPHEN 10 MG/ML IV SOLN
INTRAVENOUS | Status: DC | PRN
  Administered 2013-04-09: 1000 mg via INTRAVENOUS

## 2013-04-09 MED ORDER — CEFAZOLIN SODIUM-DEXTROSE 2-3 GM-% IV SOLR
2.0000 g | INTRAVENOUS | Status: DC
  Filled 2013-04-09: qty 50

## 2013-04-09 MED ORDER — OXYCODONE HCL 5 MG/5ML PO SOLN
5.0000 mg | Freq: Once | ORAL | Status: DC | PRN
  Filled 2013-04-09: qty 5

## 2013-04-09 MED ORDER — MEPERIDINE HCL 25 MG/ML IJ SOLN
6.2500 mg | INTRAMUSCULAR | Status: DC | PRN
  Filled 2013-04-09: qty 1

## 2013-04-09 MED ORDER — OXYCODONE HCL 5 MG PO TABS
5.0000 mg | ORAL_TABLET | Freq: Once | ORAL | Status: DC | PRN
  Filled 2013-04-09: qty 1

## 2013-04-09 MED ORDER — LIDOCAINE HCL (CARDIAC) 20 MG/ML IV SOLN
INTRAVENOUS | Status: DC | PRN
  Administered 2013-04-09: 100 mg via INTRAVENOUS

## 2013-04-09 MED ORDER — CEPHALEXIN 500 MG PO CAPS: 500.0000 mg | ORAL_CAPSULE | Freq: Three times a day (TID) | ORAL | Status: DC

## 2013-04-09 MED ORDER — MIDAZOLAM HCL 2 MG/2ML IJ SOLN
INTRAMUSCULAR | Status: AC
  Filled 2013-04-09: qty 2

## 2013-04-09 MED ORDER — FENTANYL CITRATE 0.05 MG/ML IJ SOLN
INTRAMUSCULAR | Status: AC
  Filled 2013-04-09: qty 6

## 2013-04-09 MED ORDER — PROPOFOL 10 MG/ML IV BOLUS
INTRAVENOUS | Status: DC | PRN
  Administered 2013-04-09: 200 mg via INTRAVENOUS

## 2013-04-09 MED ORDER — MIDAZOLAM HCL 5 MG/5ML IJ SOLN
INTRAMUSCULAR | Status: DC | PRN
  Administered 2013-04-09: 2 mg via INTRAVENOUS

## 2013-04-09 MED ORDER — BELLADONNA ALKALOIDS-OPIUM 16.2-60 MG RE SUPP
RECTAL | Status: AC
  Filled 2013-04-09: qty 1

## 2013-04-09 MED ORDER — HYDROMORPHONE HCL PF 1 MG/ML IJ SOLN
0.2500 mg | INTRAMUSCULAR | Status: DC | PRN
  Filled 2013-04-09: qty 1

## 2013-04-09 MED ORDER — LIDOCAINE HCL 2 % EX GEL
CUTANEOUS | Status: DC | PRN
  Administered 2013-04-09: 1 via URETHRAL

## 2013-04-09 MED ORDER — SODIUM CHLORIDE 0.9 % IR SOLN
Status: DC | PRN
  Administered 2013-04-09: 4000 mL

## 2013-04-09 MED ORDER — BELLADONNA ALKALOIDS-OPIUM 16.2-60 MG RE SUPP
RECTAL | Status: DC | PRN
  Administered 2013-04-09: 1 via RECTAL

## 2013-04-09 MED ORDER — KETOROLAC TROMETHAMINE 30 MG/ML IJ SOLN
INTRAMUSCULAR | Status: DC | PRN
  Administered 2013-04-09: 30 mg via INTRAVENOUS

## 2013-04-09 MED ORDER — FENTANYL CITRATE 0.05 MG/ML IJ SOLN
INTRAMUSCULAR | Status: DC | PRN
  Administered 2013-04-09 (×4): 12.5 ug via INTRAVENOUS
  Administered 2013-04-09: 50 ug via INTRAVENOUS
  Administered 2013-04-09 (×12): 12.5 ug via INTRAVENOUS

## 2013-04-09 MED ORDER — ONDANSETRON HCL 4 MG/2ML IJ SOLN
INTRAMUSCULAR | Status: DC | PRN
  Administered 2013-04-09: 4 mg via INTRAVENOUS

## 2013-04-09 MED ORDER — PROMETHAZINE HCL 25 MG/ML IJ SOLN
6.2500 mg | INTRAMUSCULAR | Status: DC | PRN
  Filled 2013-04-09: qty 1

## 2013-04-09 MED ORDER — LACTATED RINGERS IV SOLN
INTRAVENOUS | Status: DC | PRN
  Administered 2013-04-09 (×2): via INTRAVENOUS

## 2013-04-09 SURGICAL SUPPLY — 41 items
BAG DRAIN URO-CYSTO SKYTR STRL (DRAIN) ×3 IMPLANT
BASKET LASER NITINOL 1.9FR (BASKET) IMPLANT
BASKET STNLS GEMINI 4WIRE 3FR (BASKET) IMPLANT
BASKET STONE NCOMPASS (UROLOGICAL SUPPLIES) ×3 IMPLANT
BASKET ZERO TIP NITINOL 2.4FR (BASKET) IMPLANT
CANISTER SUCT LVC 12 LTR MEDI- (MISCELLANEOUS) ×3 IMPLANT
CATH CLEAR GEL 3F BACKSTOP (CATHETERS) IMPLANT
CATH INTERMIT  6FR 70CM (CATHETERS) IMPLANT
CATH URET 5FR 28IN CONE TIP (BALLOONS)
CATH URET 5FR 28IN OPEN ENDED (CATHETERS) ×3 IMPLANT
CATH URET 5FR 70CM CONE TIP (BALLOONS) IMPLANT
CATH URET DUAL LUMEN 6-10FR 50 (CATHETERS) IMPLANT
CLOTH BEACON ORANGE TIMEOUT ST (SAFETY) ×3 IMPLANT
DRAPE CAMERA CLOSED 9X96 (DRAPES) ×3 IMPLANT
ELECT REM PT RETURN 9FT ADLT (ELECTROSURGICAL)
ELECTRODE REM PT RTRN 9FT ADLT (ELECTROSURGICAL) IMPLANT
FIBER LASER FLEXIVA 200 (UROLOGICAL SUPPLIES) IMPLANT
FIBER LASER FLEXIVA 365 (UROLOGICAL SUPPLIES) IMPLANT
GLOVE BIO SURGEON STRL SZ 6.5 (GLOVE) ×3 IMPLANT
GLOVE BIO SURGEON STRL SZ7 (GLOVE) ×3 IMPLANT
GLOVE ECLIPSE 7.0 STRL STRAW (GLOVE) ×3 IMPLANT
GLOVE INDICATOR 7.0 STRL GRN (GLOVE) ×6 IMPLANT
GLOVE INDICATOR 7.5 STRL GRN (GLOVE) ×3 IMPLANT
GOWN STRL REUS W/ TWL LRG LVL3 (GOWN DISPOSABLE) ×2 IMPLANT
GOWN STRL REUS W/TWL LRG LVL3 (GOWN DISPOSABLE) ×1
GOWN STRL REUS W/TWL XL LVL3 (GOWN DISPOSABLE) ×3 IMPLANT
GUIDEWIRE 0.038 PTFE COATED (WIRE) IMPLANT
GUIDEWIRE ANG ZIPWIRE 038X150 (WIRE) IMPLANT
GUIDEWIRE STR DUAL SENSOR (WIRE) ×3 IMPLANT
IV NS IRRIG 3000ML ARTHROMATIC (IV SOLUTION) ×3 IMPLANT
KIT BALLIN UROMAX 15FX10 (LABEL) IMPLANT
KIT BALLN UROMAX 15FX4 (MISCELLANEOUS) IMPLANT
KIT BALLN UROMAX 26 75X4 (MISCELLANEOUS)
PACK CYSTOSCOPY (CUSTOM PROCEDURE TRAY) ×3 IMPLANT
SET HIGH PRES BAL DIL (LABEL)
SHEATH ACCESS URETERAL 38CM (SHEATH) ×3 IMPLANT
SHEATH ACCESS URETERAL 54CM (SHEATH) IMPLANT
SHEATH URET ACCESS 12FR/35CM (UROLOGICAL SUPPLIES) IMPLANT
SHEATH URET ACCESS 12FR/55CM (UROLOGICAL SUPPLIES) IMPLANT
STENT POLARIS LOOP 6FR X 24 CM (STENTS) ×3 IMPLANT
SYRINGE IRR TOOMEY STRL 70CC (SYRINGE) IMPLANT

## 2013-04-09 NOTE — Op Note (Signed)
Urology Operative Report  Date of Procedure: 04/09/13  Surgeon: Natalia Leatherwoodaniel Lemar Bakos, MD Assistant:  None  Preoperative Diagnosis: Right nephrolithiasis. Postoperative Diagnosis:  Same  Procedure(s): Right ureteroscopy with stone removal. Right retrograde pyelogram with interpretation. Right ureter stent placement (6 x 24 polaris, no tether). Right ureter stent removal. Cystoscopy.  Estimated blood loss: None  Specimen: Stones provided to patient.  Drains: None  Complications: None  Findings: Large burden of small stone fragments. Negative extravasation of contrast from urinary collecting system.  History of present illness: 38 year old female with a 5.3 cm right staghorn stone presents for ureteroscopy to remove fragments after previous percutaneous nephrostolithotomy.   Procedure in detail: After informed consent was obtained, the patient was taken to the operating room. They were placed in the supine position. SCDs were turned on and in place. IV antibiotics were infused, and general anesthesia was induced. A timeout was performed in which the correct patient, surgical site, and procedure were identified and agreed upon by the team.  The patient was placed in a dorsolithotomy position, making sure to pad all pertinent neurovascular pressure points. A belladonna and opium suppository was placed into the rectum. The genitals were prepped and draped in the usual sterile fashion.  A rigid cystoscope was advanced through urethra and into the bladder. Attention was turned the right ureter orifice. The stent was removed with a stent grasper.  I obtained a right retrograde pyelogram by cannulating the right ureter orifice with a 5 French ureter catheter and injecting 10 cc of Omnipaque. This revealed no filling defects in the ureter and no hydronephrosis in the kidney. There was no extravasation of contrast indicating that the site of percutaneous access has healed well. The upper pole  collecting system did not drain well.  I placed a sensor wire up the right ureter and into the right renal pelvis on fluoroscopy. This was secured to the drape as a safety wire.  I then navigated flexible digital ureter scope through the urethra, into the bladder, and up the ureter with ease. I evaluated the kidney a systematic fashion. The upper pole have a large stone burden of mostly small stones but there were a large number of stone fragments. There were other calyces with a few stone fragments. I did not identify any were where there was disruption of the mucosa were previous access had been obtained percutaneously. I then placed a second sensor wire through the ureteroscope and backloaded the ureter scope.  I placed a 12-14 ureter access sheath over the working wire under fluoroscopy with ease up into the right renal pelvis. The access sheath obturator was then removed along with the wire. I then passed the digital ureter scope up the access sheath and into the kidney and removed the stone fragments with the basket. After doing this, all that remained were fragments which were too small to grasp with the basket. I felt it most of these fragments would be able to be passed with the stent in place and on her on without a stent.  I withdrew the ureter scope and the sheath and visualized the entire surface of the ureter. There was no injury to the ureter. The mucosa remained intact.  Elected to place a right ureter stent to help drain the remaining stone fragments. I insured that the sensor wire entered the upper pole kidney which appeared to have a difficult time draining. I then loaded this wire through the cystoscope and placed a 6 x 24 Polaris stent  without tether over the wire under fluoroscopy with ease. A good curl deployed in the right upper pole calyx and the loops were deployed in the bladder.  The cystoscope was removed and the bladder was drained. I placed 10 cc of lidocaine jelly into the  urethra.  This completed the procedure, anesthesia was reversed and she was placed in a supine position, and she was taken to the PACU in a stable condition.  All counts were correct at the end of the case.  She'll be given Keflex to begin the day prior to her followup appointment for stent removal. I do not believe that she will need another ureteroscopy as I removed all the stone burden that I could possibly remove.

## 2013-04-09 NOTE — H&P (Signed)
Urology History and Physical Exam  CC: Right nephrolithiasis.  HPI:  38 year old female presents today for right nephrolithiasis.  She had a very large right staghorn stone stone which measured up to 5.3 cm in size.  Her stones are made of carbonate apatite.  She presented for percutaneous nephrostolithotomy, but this was complicated by fever and tachycardia whenever the stone was manipulated.  The first stage was performed on 03/18/13.  The second stage could not be completed until 03/27/13 due to fever and tachycardia at other attempts.  At this second PCNL, it was found that all of the stone had been broken up into smaller pieces and it was felt that most of the pieces could now be removed via ureteroscopy.  We have discussed the risk and benefits and she presents today for cystoscopy, right ureteroscopy, possible laser lithotripsy, basket stone retrieval, right retrograde program, and possible right ureter stent exchange.  She understands that this may be the first of 2 ureteroscopy to remove all of her stone burden.  Many of her stones broken up into smaller pieces which may be too small to grasp with a basket.  We discussed risks, benefits, alternates, and likelihood of achieving goals.  She has had urinary frequency and some flank pain intermittently.  He is mostly controlled with oral medications.  Most recent urine culture 04/03/13 was negative for growth.  She has ongoing right flank pain that is relieved with hydrocodone; she no longer required dilaudid.  PMH: Past Medical History  Diagnosis Date  . Eczema   . Yeast infection     current- 03/25/13- has notified Dr Margarita Grizzle. per patient --  PT STATES 04-01-2013  IMPROVING BUT STILL IRRITATED  . Right nephrolithiasis   . Right wrist tendonitis   . History of kidney stones   . H/O nephrolithotomy with removal of calculi     RIGHT FLANK OPEN INCISION AREA--  CHANGE DRESSING DAILY    PSH: Past Surgical History  Procedure Laterality Date  .  Ectopic pregnancy surgery  2005  . Cesarean section with bilateral tubal ligation Right 10/18/2012    Procedure: REPEAT CESAREAN SECTION WITH BILATERAL TUBAL LIGATION;  Surgeon: Oliver Pila, MD;  Location: [facility];  Service: Obstetrics;  Laterality: Right; TOTAL OF 4 C SECTIONS  . Nephrolithotomy Right 03/03/2013    Procedure: Cystoscopy, Right Ureteroscopy with holmium laser With Strent;  Surgeon: Milford Cage, MD;  Location: [facility];  Service: Urology;  Laterality: Right;  . Holmium laser application Right 03/03/2013    Procedure: HOLMIUM LASER APPLICATION;  Surgeon: Milford Cage, MD;  Location: [facility];  Service: Urology;  Laterality: Right;  . Nephrolithotomy Right 03/18/2013    Procedure: RIGHT NEPHROLITHOTOMY PERCUTANEOUS   (1 STAGE) WITH HOLMIUM LASER;  Surgeon: Milford Cage, MD;  Location: [facility];  Service: Urology;  Laterality: Right;  . Holmium laser application Right 03/18/2013    Procedure: HOLMIUM LASER APPLICATION;  Surgeon: Milford Cage, MD;  Location: [facility];  Service: Urology;  Laterality: Right;  . Cystoscopy/retrograde/ureteroscopy Right 03/27/2013    Procedure: CYSTOSCOPY RIGHT URETEROSCOPY LASER LITHOTRIPSY  RIGHT RETROGRADE PYELOGRAM STENT PLACEMENT ON RIGHT;  Surgeon: Milford Cage, MD;  Location: [facility];  Service: Urology;  Laterality: Right;  . Holmium laser application Right 03/27/2013    Procedure: HOLMIUM LASER APPLICATION;  Surgeon: Milford Cage, MD;  Location: [facility];  Service: Urology;  Laterality: Right;    Allergies: No Known Allergies  Medications: No prescriptions prior to admission  Social History: History   Social History  . Marital Status: Single    Spouse Name: N/A    Number of Children: N/A  . Years of Education: N/A   Occupational History  . Not on file.   Social History Main Topics  . Smoking status: Former Smoker -- 0.25 packs/day for 10 years    Types: Cigarettes    Quit date:  03/11/2013  . Smokeless tobacco: Never Used     Comment: - plans to quit  . Alcohol Use: Yes     Comment: occasional -rare  . Drug Use: No  . Sexual Activity: Yes   Other Topics Concern  . Not on file   Social History Narrative  . No narrative on file    Family History: Family History  Problem Relation Age of Onset  . Diabetes type II Mother   . Hypertension Mother   . Diabetes type II Sister     Review of Systems: Positive: Right flank pain. Negative: Fever, SOB, or chest pain.  A further 10 point review of systems was negative except what is listed in the HPI.  Physical Exam: Filed Vitals:   04/09/13 1147  BP: 148/98  Pulse: 108  Temp: 98.5 F (36.9 C)  Resp: 16    General: No acute distress.  Awake. Head:  Normocephalic.  Atraumatic. ENT:  EOMI.  Mucous membranes moist Neck:  Supple.  No lymphadenopathy. CV:  S1 present. S2 present. Regular rate. Pulmonary: Equal effort bilaterally.  Clear to auscultation bilaterally. Abdomen: Soft.  Non- tender to palpation. Skin:  Normal turgor.  No visible rash. Extremity: No gross deformity of bilateral upper extremities.  No gross deformity of    bilateral lower extremities. Neurologic: Alert. Appropriate mood.    Studies:  No results found for this basename: HGB, WBC, PLT,  in the last 72 hours  No results found for this basename: NA, K, CL, CO2, BUN, CREATININE, CALCIUM, MAGNESIUM, GFRNONAA, GFRAA,  in the last 72 hours   No results found for this basename: PT, INR, APTT,  in the last 72 hours   No components found with this basename: ABG,     Assessment:  Right nephrolithiasis.  Plan: To OR for cystoscopy, right ureteroscopy, possible laser lithotripsy, basket stone retrieval, right retrograde program, and possible right ureter stent exchange.

## 2013-04-09 NOTE — Transfer of Care (Signed)
Immediate Anesthesia Transfer of Care Note  Patient: [name]  Procedure(s) Performed: Procedure(s) (LRB): CYSTOSCOPY WITH STAGED URETEROSCOPY AND STENT EXCHANGE (Right) HOLMIUM LASER APPLICATION (Right)  Patient Location: PACU  Anesthesia Type: General  Level of Consciousness: awake, sedated, patient cooperative and responds to stimulation  Airway & Oxygen Therapy: Patient Spontanous Breathing and Patient connected to face mask oxygen  Post-op Assessment: Report given to PACU RN, Post -op Vital signs reviewed and stable and Patient moving all extremities  Post vital signs: Reviewed and stable  Complications: No apparent anesthesia complications

## 2013-04-09 NOTE — Discharge Instructions (Signed)
DISCHARGE INSTRUCTIONS FOR KIDNEY STONES OR URETERAL STENT  MEDICATIONS:   1.Resume all your other meds from home.  2. Begin the antibiotic, keflex, the day before you return to clinic.  ACTIVITY 1. No strenuous activity x 1week 2. No driving while on narcotic pain medications 3. Drink plenty of water 4. Continue to walk at home - you can still get blood clots when you are at home, so keep active, but don't over do it. 5. May return to work in 3 days.  BATHING 1. You can shower or a bath.    SIGNS/SYMPTOMS TO CALL: 1. Please call us if you have a fever greater than 101.5, uncontrolled  nausea/vomiting, uncontrolled pain, dizziness, unable to urinate, chest pain, shortness of breath, leg swelling, leg pain, redness around wound, drainage from wound, or any other concerns or questions.  You can reach us at [phone].    Post Anesthesia Home Care Instructions  Activity: Get plenty of rest for the remainder of the day. A responsible adult should stay with you for 24 hours following the procedure.  For the next 24 hours, DO NOT: -Drive a car -Advertising copywriterperate machinery -Drink alcoholic beverages -Take any medication unless instructed by your physician -Make any legal decisions or sign important papers.  Meals: Start with liquid foods such as gelatin or soup. Progress to regular foods as tolerated. Avoid greasy, spicy, heavy foods. If nausea and/or vomiting occur, drink only clear liquids until the nausea and/or vomiting subsides. Call your physician if vomiting continues.  Special Instructions/Symptoms: Your throat may feel dry or sore from the anesthesia or the breathing tube placed in your throat during surgery. If this causes discomfort, gargle with warm salt water. The discomfort should disappear within 24 hours.

## 2013-04-09 NOTE — Anesthesia Procedure Notes (Signed)
Procedure Name: LMA Insertion Date/Time: 04/09/2013 12:19 PM Performed by: Jessica PriestBEESON, Jameison Haji C Pre-anesthesia Checklist: Patient identified, Emergency Drugs available, Suction available and Patient being monitored Patient Re-evaluated:Patient Re-evaluated prior to inductionOxygen Delivery Method: Circle System Utilized Preoxygenation: Pre-oxygenation with 100% oxygen Intubation Type: IV induction Ventilation: Mask ventilation without difficulty LMA: LMA inserted LMA Size: 4.0 Number of attempts: 1 Airway Equipment and Method: bite block Placement Confirmation: positive ETCO2 Tube secured with: Tape Dental Injury: Teeth and Oropharynx as per pre-operative assessment

## 2013-04-09 NOTE — Anesthesia Preprocedure Evaluation (Signed)
Anesthesia Evaluation  Patient identified by MRN, date of birth, ID band Patient awake    Reviewed: Allergy & Precautions, H&P , NPO status , Patient's Chart, lab work & pertinent test results, reviewed documented beta blocker date and time   Airway Mallampati: II TM Distance: >3 FB Neck ROM: full    Dental no notable dental hx. (+) Teeth Intact, Dental Advisory Given   Pulmonary shortness of breath and with exertion, Current Smoker, former smoker,  breath sounds clear to auscultation  Pulmonary exam normal       Cardiovascular Exercise Tolerance: Good hypertension, Pt. on medications Rhythm:regular Rate:Normal     Neuro/Psych negative neurological ROS  negative psych ROS   GI/Hepatic negative GI ROS, Neg liver ROS,   Endo/Other  negative endocrine ROS  Renal/GU Renal disease  negative genitourinary   Musculoskeletal   Abdominal   Peds  Hematology Platelets 700K   Anesthesia Other Findings   Reproductive/Obstetrics negative OB ROS                           Anesthesia Physical  Anesthesia Plan  ASA: II  Anesthesia Plan: General   Post-op Pain Management:    Induction: Intravenous  Airway Management Planned: LMA  Additional Equipment:   Intra-op Plan:   Post-operative Plan: Extubation in OR  Informed Consent: I have reviewed the patients History and Physical, chart, labs and discussed the procedure including the risks, benefits and alternatives for the proposed anesthesia with the patient or authorized representative who has indicated his/her understanding and acceptance.   Dental Advisory Given  Plan Discussed with: CRNA  Anesthesia Plan Comments:         Anesthesia Quick Evaluation

## 2013-04-10 NOTE — Anesthesia Postprocedure Evaluation (Signed)
  Anesthesia Post-op Note  Patient: [name]  Procedure(s) Performed: Procedure(s) (LRB): CYSTOSCOPY WITH STAGED URETEROSCOPY AND STENT EXCHANGE (Right) HOLMIUM LASER APPLICATION (Right)  Patient Location: PACU  Anesthesia Type: General  Level of Consciousness: awake and alert   Airway and Oxygen Therapy: Patient Spontanous Breathing  Post-op Pain: mild  Post-op Assessment: Post-op Vital signs reviewed, Patient's Cardiovascular Status Stable, Respiratory Function Stable, Patent Airway and No signs of Nausea or vomiting  Last Vitals:  Filed Vitals:   04/09/13 1630  BP: 131/78  Pulse: 88  Temp: 36.3 C  Resp: 20    Post-op Vital Signs: stable   Complications: No apparent anesthesia complications

## 2013-04-11 ENCOUNTER — Encounter (HOSPITAL_BASED_OUTPATIENT_CLINIC_OR_DEPARTMENT_OTHER): Payer: Self-pay | Admitting: Urology

## 2013-08-12 ENCOUNTER — Emergency Department (HOSPITAL_COMMUNITY)
Admission: EM | Admit: 2013-08-12 | Discharge: 2013-08-12 | Disposition: A | Discharge status: Home / Self Care | Payer: Medicaid Other | Attending: Emergency Medicine | Admitting: Emergency Medicine

## 2013-08-12 ENCOUNTER — Emergency Department (HOSPITAL_COMMUNITY): Payer: Medicaid Other

## 2013-08-12 ENCOUNTER — Encounter (HOSPITAL_COMMUNITY): Payer: Self-pay | Admitting: Emergency Medicine

## 2013-08-12 DIAGNOSIS — M25519 Pain in unspecified shoulder: Secondary | ICD-10-CM | POA: Insufficient documentation

## 2013-08-12 DIAGNOSIS — Y939 Activity, unspecified: Secondary | ICD-10-CM | POA: Insufficient documentation

## 2013-08-12 DIAGNOSIS — Z87891 Personal history of nicotine dependence: Secondary | ICD-10-CM | POA: Insufficient documentation

## 2013-08-12 DIAGNOSIS — Z792 Long term (current) use of antibiotics: Secondary | ICD-10-CM | POA: Insufficient documentation

## 2013-08-12 DIAGNOSIS — X58XXXA Exposure to other specified factors, initial encounter: Secondary | ICD-10-CM | POA: Insufficient documentation

## 2013-08-12 DIAGNOSIS — IMO0001 Reserved for inherently not codable concepts without codable children: Secondary | ICD-10-CM | POA: Insufficient documentation

## 2013-08-12 DIAGNOSIS — Y929 Unspecified place or not applicable: Secondary | ICD-10-CM | POA: Insufficient documentation

## 2013-08-12 DIAGNOSIS — Z79899 Other long term (current) drug therapy: Secondary | ICD-10-CM | POA: Insufficient documentation

## 2013-08-12 DIAGNOSIS — T148XXA Other injury of unspecified body region, initial encounter: Secondary | ICD-10-CM

## 2013-08-12 DIAGNOSIS — Z8619 Personal history of other infectious and parasitic diseases: Secondary | ICD-10-CM | POA: Insufficient documentation

## 2013-08-12 DIAGNOSIS — IMO0002 Reserved for concepts with insufficient information to code with codable children: Secondary | ICD-10-CM | POA: Insufficient documentation

## 2013-08-12 DIAGNOSIS — M25511 Pain in right shoulder: Secondary | ICD-10-CM

## 2013-08-12 DIAGNOSIS — Z87442 Personal history of urinary calculi: Secondary | ICD-10-CM | POA: Insufficient documentation

## 2013-08-12 DIAGNOSIS — Z872 Personal history of diseases of the skin and subcutaneous tissue: Secondary | ICD-10-CM | POA: Insufficient documentation

## 2013-08-12 MED ORDER — HYDROCODONE-ACETAMINOPHEN 5-325 MG PO TABS: 1.0000 | ORAL_TABLET | Freq: Four times a day (QID) | ORAL | Status: DC | PRN

## 2013-08-12 MED ORDER — IBUPROFEN 600 MG PO TABS: 600.0000 mg | ORAL_TABLET | Freq: Four times a day (QID) | ORAL | Status: DC | PRN

## 2013-08-12 MED ORDER — CYCLOBENZAPRINE HCL 10 MG PO TABS: 10.0000 mg | ORAL_TABLET | Freq: Two times a day (BID) | ORAL | Status: DC | PRN

## 2013-08-12 NOTE — ED Notes (Signed)
To ED with C/O pain in her right shoulder and right shoulder blade.  Pain has been continuous for 2 days.  She has taken Ibuprofen without improvement.  Patient unable to raise her arm without pain. Denies recent injury.

## 2013-08-12 NOTE — ED Provider Notes (Signed)
CSN: [id number]     Arrival date & time 08/12/13  1550 History  This chart was scribed for non-physician practitioner, Raymon MuttonMarissa Floyd Wade, PA-C,working with Raeford RazorStephen Kohut, MD, by Karle PlumberJennifer Tensley, ED Scribe. This patient was seen in room TR08C/TR08C and the patient's care was started at 4:48 PM.  Chief Complaint  Patient presents with  . Shoulder Pain   Patient is a 38 y.o. female presenting with shoulder pain. The history is provided by the patient. No language interpreter was used.  Shoulder Pain Pertinent negatives include no chest pain and no shortness of breath.   HPI Comments:  Eulis Cannervelin M Zillmer is a 38 y.o. obese female who presents to the Emergency Department complaining of right, sharp, pulling, tightening shoulder pain onset four days ago. Pt reports associated right-sided trapezius pain that radiates into her right shoulder blade. Endorses some tingling of the right hand. She states lying flat or breathing deeply makes the pain worse. She states lifting also makes the pain worse. She has been taking Ibuprofen 600 mg with no relief. She states applying heat and ice packs help alleviate the pain. She states she has been limiting the use and motion of the shoulder and arm due to pain. She denies trauma, injury, or fall. She denies warmth of the skin, dizziness, CP, SOB, difficulty breathing, difficulty swallowing, numbness or weakness to the right arm or hand, skin changes or color changes of the skin or fever. She denies h/o IV drug abuse or DM. She has no PCP. She works at a [employer] and reports heavy lifting but has since limited how much she has been lifting lately secondary to pain. Pt is right-hand dominant. She reports she is currently on her menstrual cycle.  Past Medical History  Diagnosis Date  . Eczema   . Yeast infection     current- 03/25/13- has notified Dr Margarita GrizzleWoodruff. per patient --  PT STATES 04-01-2013  IMPROVING BUT STILL IRRITATED  . Right nephrolithiasis   . Right wrist  tendonitis   . History of kidney stones   . H/O nephrolithotomy with removal of calculi     RIGHT FLANK OPEN INCISION AREA--  CHANGE DRESSING DAILY   Past Surgical History  Procedure Laterality Date  . Ectopic pregnancy surgery  2005  . Cesarean section with bilateral tubal ligation Right 10/18/2012    Procedure: REPEAT CESAREAN SECTION WITH BILATERAL TUBAL LIGATION;  Surgeon: Oliver PilaKathy W Richardson, MD;  Location: [facility];  Service: Obstetrics;  Laterality: Right; TOTAL OF 4 C SECTIONS  . Nephrolithotomy Right 03/03/2013    Procedure: Cystoscopy, Right Ureteroscopy with holmium laser With Strent;  Surgeon: Milford Cageaniel Young Woodruff, MD;  Location: [facility];  Service: Urology;  Laterality: Right;  . Holmium laser application Right 03/03/2013    Procedure: HOLMIUM LASER APPLICATION;  Surgeon: Milford Cageaniel Young Woodruff, MD;  Location: [facility];  Service: Urology;  Laterality: Right;  . Nephrolithotomy Right 03/18/2013    Procedure: RIGHT NEPHROLITHOTOMY PERCUTANEOUS   (1 STAGE) WITH HOLMIUM LASER;  Surgeon: Milford Cageaniel Young Woodruff, MD;  Location: [facility];  Service: Urology;  Laterality: Right;  . Holmium laser application Right 03/18/2013    Procedure: HOLMIUM LASER APPLICATION;  Surgeon: Milford Cageaniel Young Woodruff, MD;  Location: [facility];  Service: Urology;  Laterality: Right;  . Cystoscopy/retrograde/ureteroscopy Right 03/27/2013    Procedure: CYSTOSCOPY RIGHT URETEROSCOPY LASER LITHOTRIPSY  RIGHT RETROGRADE PYELOGRAM STENT PLACEMENT ON RIGHT;  Surgeon: Milford Cageaniel Young Woodruff, MD;  Location: [facility];  Service: Urology;  Laterality: Right;  . Holmium laser  application Right 03/27/2013    Procedure: HOLMIUM LASER APPLICATION;  Surgeon: Milford Cage, MD;  Location: [facility];  Service: Urology;  Laterality: Right;  . Cystoscopy with retrograde pyelogram, ureteroscopy and stent placement Right 04/09/2013    Procedure: CYSTOSCOPY WITH STAGED URETEROSCOPY AND STENT EXCHANGE;  Surgeon: Milford Cage, MD;  Location: [facility];  Service: Urology;  Laterality: Right;  . Holmium laser application Right 04/09/2013    Procedure: HOLMIUM LASER APPLICATION;  Surgeon: Milford Cage, MD;  Location: [facility];  Service: Urology;  Laterality: Right;   Family History  Problem Relation Age of Onset  . Diabetes type II Mother   . Hypertension Mother   . Diabetes type II Sister    History  Substance Use Topics  . Smoking status: Former Smoker -- 0.25 packs/day for 10 years    Types: Cigarettes    Quit date: 03/11/2013  . Smokeless tobacco: Never Used     Comment: - plans to quit  . Alcohol Use: Yes     Comment: occasional -rare   OB History   Grav Para Term Preterm Abortions TAB SAB Ect Mult Living   5 4 4  0 1 0 0 1  4     Review of Systems  Constitutional: Negative for fever.  HENT: Negative for trouble swallowing.   Respiratory: Negative for shortness of breath.   Cardiovascular: Negative for chest pain.  Musculoskeletal: Positive for myalgias.  Skin: Negative for color change.  Neurological: Negative for dizziness, weakness and numbness.    Allergies  Review of patient's allergies indicates no known allergies.  Home Medications   Prior to Admission medications   Medication Sig Start Date End Date Taking? Authorizing Provider  cephALEXin (KEFLEX) 500 MG capsule Take 1 capsule (500 mg total) by mouth 3 (three) times daily. Begin the day before your follow up appointment. 04/09/13   Magdalene Molly, MD  cyclobenzaprine (FLEXERIL) 10 MG tablet Take 1 tablet (10 mg total) by mouth 2 (two) times daily as needed for muscle spasms. 08/12/13   Lean Jaeger, PA-C  HYDROcodone-acetaminophen (NORCO/VICODIN) 5-325 MG per tablet Take 1-2 tablets by mouth every 6 (six) hours as needed for moderate pain. 04/09/13   Magdalene Molly, MD  HYDROcodone-acetaminophen (NORCO/VICODIN) 5-325 MG per tablet Take 1 tablet by mouth every 6 (six) hours as needed for moderate pain or severe  pain. 08/12/13   Davell Beckstead, PA-C  hyoscyamine (LEVSIN SL) 0.125 MG SL tablet Place 1 tablet (0.125 mg total) under the tongue every 4 (four) hours as needed (bladder spasms). 03/07/13   Magdalene Molly, MD  ibuprofen (ADVIL,MOTRIN) 600 MG tablet Take 1 tablet (600 mg total) by mouth every 6 (six) hours as needed. 08/12/13   Alem Fahl, PA-C  senna-docusate (SENOKOT S) 8.6-50 MG per tablet Take 1 tablet by mouth daily. 03/21/13   Magdalene Molly, MD   Triage Vitals: BP 138/75  Pulse 66  Temp(Src) 98 F (36.7 C) (Oral)  Resp 18  Ht 5\' 5"  (1.651 m)  Wt 172 lb (78.019 kg)  BMI 28.62 kg/m2  SpO2 99%  LMP 08/12/2013 Physical Exam  Nursing note and vitals reviewed. Constitutional: She is oriented to person, place, and time. She appears well-developed and well-nourished.  HENT:  Head: Normocephalic and atraumatic.  Mouth/Throat: Oropharynx is clear and moist. No oropharyngeal exudate.  Negative trismus  Eyes: Conjunctivae and EOM are normal. Pupils are equal, round, and reactive to light. Right eye exhibits  no discharge. Left eye exhibits no discharge.  Neck: Normal range of motion. Neck supple. No tracheal deviation present.  Negative neck stiffness Negative nuchal rigidity Negative cervical lymphadenopathy Negative meningeal signs Negative discomfort upon palpation to musculature of the neck  Cardiovascular: Normal rate, regular rhythm and normal heart sounds.  Exam reveals no friction rub.   No murmur heard. Cap refill less than 3 seconds  Pulmonary/Chest: Effort normal and breath sounds normal. No respiratory distress. She has no wheezes. She has no rales.  Musculoskeletal: Normal range of motion. She exhibits tenderness. She exhibits no edema.       Right shoulder: She exhibits tenderness and bony tenderness. She exhibits normal range of motion, no swelling, no effusion, no crepitus, no deformity, no laceration, no pain, no spasm, normal pulse and normal strength.        Arms: Negative swelling, erythema, formation, lesions, sores, deformities, malalignment identified to the right shoulder. Negative warmth upon palpation. Negative crepitus upon palpation. Discomfort upon palpation to the anterior posterior aspect of the right shoulder as well as the right trapezius muscle. Full range of motion noted to the shoulder-full extension, flexion, inversion, eversion, abduction and adduction. Full range of motion to the right elbow and right hand/wrist. Negative drop arm. Negative sunken in appearance.  Lymphadenopathy:    She has no cervical adenopathy.  Neurological: She is alert and oriented to person, place, and time. No cranial nerve deficit. She exhibits normal muscle tone. Coordination normal.  Cranial nerves III-XII grossly intact Strength 5+/5+ to upper extremities bilaterally with resistance applied, equal distribution noted Strength intact to MCP, PIP, DIP joints of right hand Sensation intact with differentiation sharp and dull touch Negative arm drift Negative facial droop Negative slurred speech Negative aphasia  Skin: Skin is warm and dry.  Psychiatric: She has a normal mood and affect. Her behavior is normal.    ED Course  Procedures (including critical care time) DIAGNOSTIC STUDIES: Oxygen Saturation is 99% on RA, normal by my interpretation.   COORDINATION OF CARE: 5:01 PM- Will prescribe pain medication, refer to orthopedics and advised pt of return precautions. Advised pt to continue to apply hot compresses and massage intermittently. Pt verbalizes understanding and agrees to plan.  Medications - No data to display  Labs Review Labs Reviewed - No data to display  Imaging Review Dg Shoulder Right  08/12/2013   CLINICAL DATA:  Right shoulder pain  EXAM: RIGHT SHOULDER - 2+ VIEW  COMPARISON:  07/25/2008  FINDINGS: There is no evidence of fracture or dislocation. There is no evidence of arthropathy or other focal bone abnormality. Soft tissues  are unremarkable.  IMPRESSION: No acute osseous finding.   Electronically Signed   By: Ruel Favors M.D.   On: 08/12/2013 16:33     EKG Interpretation None      MDM   Final diagnoses:  Right shoulder pain  Muscle strain    Filed Vitals:   08/12/13 1554  BP: 138/75  Pulse: 66  Temp: 98 F (36.7 C)  TempSrc: Oral  Resp: 18  Height: 5\' 5"  (1.651 m)  Weight: 172 lb (78.019 kg)  SpO2: 99%   I personally performed the services described in this documentation, which was scribed in my presence. The recorded information has been reviewed and is accurate.  Plain film unremarkable. Doubt septic joint. Doubt abscess. Doubt myositis or deep tissue infection. Suspicion to be muscular strain. Full range of motion identified. Pulses intact. Negative focal neurological deficits noted. Strength intact with  equal distribution. Patient stable, afebrile. Patient not septic appearing. Discharged patient. Discharged patient with pain medications-discussed with patient course, precautions, disposal technique. Ref[facility] to [facility] and orthopedics. Discussed with patient to rest, heat and massage - discussed supportive therapy. Discussed with patient to closely monitor symptoms and if symptoms are to worsen or change to report back to the ED - strict return instructions given.  Patient agreed to plan of care, understood, all questions answered.   Raymon Mutton, PA-C 08/13/13 1244

## 2013-08-12 NOTE — ED Notes (Signed)
Pt reports right shoulder pain x 4 days. Reports hx of shoulder pain but denies new injury. Pt is able to move her right arm but has increase in pain.

## 2013-08-12 NOTE — Discharge Instructions (Signed)
Please call your doctor for a followup appointment within 24-48 hours. When you talk to your doctor please let them know that you were seen in the emergency department and have them acquire all of your records so that they can discuss the findings with you and formulate a treatment plan to fully care for your new and ongoing problems. Please call and set-up an appointment with Orthopedics Please rest and stay hydrated Please keep arm in motion at all times Please massage with icy hot ointment, please apply heat Please take medications as prescribed-while on pain medications there is to be no drinking alcohol, driving, operating any heavy machinery. If there is extra please proper manner. Please do not take any extra Tylenol for this can lead to Tylenol overdose and liver issues. Please while on muscle relaxers this can lead to drowsiness-please do not take alcohol, driving operating any heavy machinery while on the medications Please continue to monitor symptoms closely and if symptoms are to worsen or change (fever greater than 101, chills, chest pain, shortness of breath, difficulty breathing, numbness, tingling, worsening or changes to pain pattern, swelling to the arm, redness, hot to touch, numbness, tingling, increased or changes to pain pattern) please report back to emergency department immediately  Shoulder Pain The shoulder is the joint that connects your arms to your body. The bones that form the shoulder joint include the upper arm bone (humerus), the shoulder blade (scapula), and the collarbone (clavicle). The top of the humerus is shaped like a ball and fits into a rather flat socket on the scapula (glenoid cavity). A combination of muscles and strong, fibrous tissues that connect muscles to bones (tendons) support your shoulder joint and hold the ball in the socket. Small, fluid-filled sacs (bursae) are located in different areas of the joint. They act as cushions between the bones and the  overlying soft tissues and help reduce friction between the gliding tendons and the bone as you move your arm. Your shoulder joint allows a wide range of motion in your arm. This range of motion allows you to do things like scratch your back or throw a ball. However, this range of motion also makes your shoulder more prone to pain from overuse and injury. Causes of shoulder pain can originate from both injury and overuse and usually can be grouped in the following four categories:  Redness, swelling, and pain (inflammation) of the tendon (tendinitis) or the bursae (bursitis).  Instability, such as a dislocation of the joint.  Inflammation of the joint (arthritis).  Broken bone (fracture). HOME CARE INSTRUCTIONS   Apply ice to the sore area.  Put ice in a plastic bag.  Place a towel between your skin and the bag.  Leave the ice on for 15-20 minutes, 3-4 times per day for the first 2 days, or as directed by your health care provider.  Stop using cold packs if they do not help with the pain.  If you have a shoulder sling or immobilizer, wear it as long as your caregiver instructs. Only remove it to shower or bathe. Move your arm as little as possible, but keep your hand moving to prevent swelling.  Squeeze a soft ball or foam pad as much as possible to help prevent swelling.  Only take over-the-counter or prescription medicines for pain, discomfort, or fever as directed by your caregiver. SEEK MEDICAL CARE IF:   Your shoulder pain increases, or new pain develops in your arm, hand, or fingers.  Your hand or  fingers become cold and numb.  Your pain is not relieved with medicines. SEEK IMMEDIATE MEDICAL CARE IF:   Your arm, hand, or fingers are numb or tingling.  Your arm, hand, or fingers are significantly swollen or turn white or blue. MAKE SURE YOU:   Understand these instructions.  Will watch your condition.  Will get help right away if you are not doing well or get  worse. Document Released: 09/28/2004 Document Revised: 05/05/2013 Document Reviewed: 12/03/2010 [facility] Patient Information 2015 [place], [place]. This information is not intended to replace advice given to you by your health care provider. Make sure you discuss any questions you have with your health care provider.   Emergency Department Resource Guide 1) Find a Doctor and Pay Out of Pocket Although you won't have to find out who is covered by your insurance plan, it is a good idea to ask around and get recommendations. You will then need to call the office and see if the doctor you have chosen will accept you as a new patient and what types of options they offer for patients who are self-pay. Some doctors offer discounts or will set up payment plans for their patients who do not have insurance, but you will need to ask so you aren't surprised when you get to your appointment.  2) Contact Your Local Health Department Not all health departments have doctors that can see patients for sick visits, but many do, so it is worth a call to see if yours does. If you don't know where your local health department is, you can check in your phone book. The CDC also has a tool to help you locate your state's health department, and many state websites also have listings of all of their local health departments.  3) Find a Walk-in Clinic If your illness is not likely to be very severe or complicated, you may want to try a walk in clinic. These are popping up all over the country in pharmacies, drugstores, and shopping centers. They're usually staffed by nurse practitioners or physician assistants that have been trained to treat common illnesses and complaints. They're usually fairly quick and inexpensive. However, if you have serious medical issues or chronic medical problems, these are probably not your best option.  No Primary Care Doctor: - Call Health Connect at  [phone] - they can help you locate a primary  care doctor that  accepts your insurance, provides certain services, etc. - Physician Referral Service- [phone]  Chronic Pain Problems: Organization         Address  Phone   Notes  [facility]  [phone] Patients need to be referred by their primary care doctor.   Medication Assistance: Organization         Address  Phone   Notes  [facility] Main Medication [facility] [address] [place] [place], [place] [address] [phone] --Must be a resident of [facility] -- Must have NO insurance coverage whatsoever (no Medicaid/ Medicare, etc.) -- The pt. MUST have a primary care doctor that directs their care regularly and follows them in the community   MedAssist  [phone]   [employer]  [phone]    Agencies that provide inexpensive medical care: Organization         Address  Phone   Notes  [facility]  [phone]   [facility]    [phone]   [facility] [phone]  [address] [place], [place] [address] [phone]   [facility] [place] [address] [place], [place] [phone]   Planned Parenthood    [phone]   [facility]    [phone]   Community Health and [facility]  [address], [place] Phone:  [phone], Fax:  [phone] Hours of Operation:  9 am - 6 pm, M-F.  Also accepts Medicaid/Medicare and self-pay.  [facility]  [address], [place] Phone: [phone], Fax: [phone]. Hours of Operation:  8:30 am - 5:30 pm, M-F.  Also accepts Medicaid and self-pay.  [facility] [place] [address] [place] Phone: [phone]   [facility] [address] Natasha Bence [place], [place] [phone], Ext. [phone] Mondays & Thursdays: 7-9 AM.  First 15 patients are seen on a first come, first serve basis.    Medicaid-accepting [facility]  Providers:  Organization         Address  Phone   Notes  [facility] [address], [place] [phone] Also accepts self-pay patients.  [facility] [address] Laurell Josephs [place], [place]  [phone]   [facility] [address], [place] [phone]   [facility] [address], [place] [phone]   Renaye Rakers [address], [place]   [phone] Only accepts [place] patients after they have their name applied to their card.   Self-Pay (no insurance) in [facility]:  Organization         Address  Phone   Notes  Sickle Cell Patients, [facility] [address] [place], [place] [phone]   [facility] [address] [place], [place] [phone]   [facility] [place]  [address], [place] [phone]   [facility]/Dr. Osei-Bonsu  [address] [place] or [address], [place] [phone] Phone number for both [place] and [place] locations is the same.  Urgent Medical and [facility] [address] [place] [phone]   [facility] [address] [place] or [address] [phone]   [facility] [address], [place] [phone], phone; [phone], fax Sees patients 1st and 3rd Saturday of every month.  Must not qualify for public or private insurance (i.e. Medicaid, Medicare, [place] Health Choice, Veterans' Benefits)  Household income should be no more than 200% of the poverty level The clinic cannot treat you if you are pregnant or think you are pregnant  Sexually transmitted diseases are not treated at the clinic.    Dental Care: Organization         Address  Phone  Notes  [facility] Department of [facility] [address] [place], [place] [phone] Accepts children up to age 39 who are enrolled in [place] or [place] Health Choice; pregnant women with a Medicaid card; and children who have applied for Medicaid or [place] Health Choice, but were declined, whose parents can pay a reduced fee at time of service.  [facility] Department of [facility]  [address], [place] [phone] Accepts children up to age 31 who are enrolled in [place] or  [place] Health Choice; pregnant women with a Medicaid card; and children who have applied for Medicaid or [place] Health Choice, but were declined, whose parents can pay a reduced fee at time of service.  [facility]  [address] [place], [place] [phone] Patients are seen by appointment only. Walk-ins are not accepted. [facility] will see patients 22 years of age and older. Monday - Tuesday (8am-5pm) Most Wednesdays (8:30-5pm) $30 per visit, cash only  [facility]  [address], [facility] [phone] Patients are seen by appointment only. Walk-ins are not accepted. [facility] will see patients 66 years of age and older. One Wednesday Evening (Monthly: Volunteer Based).  $30 per visit, cash only  [employer]  [phone] for adults; Children under age 47, call Graduate Pediatric Dentistry at [phone]. Children aged 81-14, please call [phone] to request a pediatric application.  Dental services are provided in all areas of dental care including fillings, crowns and bridges, complete and partial dentures, implants, gum treatment, root canals, and extractions. Preventive care is also provided. Treatment is provided to both adults and children. Patients are selected via a lottery and there is often a waiting list.   [facility] [address], [place]  [phone] [email or web address]   [facility]  [address] [place], [place] [phone], Ext. [phone] Second and Fourth Thursday of each month, opens at 6:30 AM; Clinic ends at 9 AM.  Patients are seen on a first-come first-served basis, and a limited number are seen during each clinic.   [facility]  [address] Ether Griffins [place], [place] [phone]   Eligibility Requirements You must have lived in [place], [place], or [place] counties for at least the last three months.   You cannot be eligible for state or federal sponsored [place], including [employer], [place], or [employer].   You generally cannot be eligible for healthcare insurance through your employer.    How to apply: Eligibility screenings are held every Tuesday and Wednesday afternoon from 1:00 pm until 4:00 pm. You do not need an appointment for the interview!  [facility] [address], [place], [place] [phone]   [facility]  [phone]   [facility]  [phone]   [facility]  [phone]    Behavioral Health Resources in the Community: Intensive Outpatient Programs Organization         Address  Phone  Notes  [facility] Services [address], [place], [place] [phone]   [facility] Outpatient [address] [place], [place] [phone]   [facility]: [facility] [address], [place], [place]  [phone]   [facility] Mental Health [address]  [place], [place] [phone] or [phone]   Substance Abuse Resources Organization         Address  Phone  Notes  Alcohol and Drug Services  [phone]   [facility]  [phone]   [facility] [place]  [phone]   Floydene Flock  [phone]   Residential & Outpatient Substance Abuse Program  [phone]   Psychological Services Organization         Address  Phone  Notes  [facility]al [facility]  [phone]    [facility]  [phone]   [facility] Mental Health [address] [place] [phone] or [phone]  Mobile Crisis Teams Organization         Address  Phone  Notes  [facility], Mobile Crisis Care Unit  [phone]   [facility]  [address] [place], [place]   [place] [address], [place] [phone]    Self-Help/Support Groups Organization         Address  Phone             Notes  [place]. of [place] - variety of support groups  [place] Call for more information  Narcotics Anonymous (NA), [facility] [address], [employer] [place]  2 meetings at this location   Special educational needs teacher         Address  Phone  Notes  [facility]idential [facility] [place],    [place]  [phone]   [facility]  [address], [place] [phone], [place], [place]   Agar [place], [place] [phone] Admissions: 8am-3pm M-F  Incentives Substance [place] [address]    [place], [place] [phone]   [facility] [address] [place], [place], [place]   [facility] [address]  [place], [place]   [facility] - Intensive Outpatient [address] Kristeen Mans [address], [place], [place]   [facility] [place]  [place], [place] [phone] or [phone]   [facility] (RTS) [address], [place], [place] Accepts Medicaid  [facility] [place] [address],  [place] [phone] Substance Abuse/Addiction Treatment   [facility] Organization         Address  Phone  Notes  [facility]  [phone]   Domenic Schwab, PhD [address] Arlis Porta [place], [place]   [phone] or [phone]    [place], [place] [phone]   [facility] [address], [place], [place] [phone] Insurance/Medicaid/sponsorship through [facility] and Families [address]                                    [place], [place] [phone] [place] [address], [place] [phone]    Dr. Adele Schilder  [phone]   [facility] Dept. 1) [address], [place] 2) [place] 3)  [place] [address], [place] [phone]  [phone]   [place] [phone] or [phone] (After Hours)

## 2013-08-14 NOTE — ED Provider Notes (Signed)
Medical screening examination/treatment/procedure(s) were performed by non-physician practitioner and as supervising physician I was immediately available for consultation/collaboration.   EKG Interpretation None       Izela Altier, MD 08/14/13 0701 

## 2013-11-03 ENCOUNTER — Encounter (HOSPITAL_COMMUNITY): Payer: Self-pay | Admitting: Emergency Medicine

## 2014-01-15 ENCOUNTER — Encounter (HOSPITAL_COMMUNITY): Payer: Self-pay | Admitting: Urology

## 2014-03-19 ENCOUNTER — Encounter (HOSPITAL_COMMUNITY): Payer: Self-pay | Admitting: Emergency Medicine

## 2014-03-19 ENCOUNTER — Emergency Department (INDEPENDENT_AMBULATORY_CARE_PROVIDER_SITE_OTHER)
Admission: EM | Admit: 2014-03-19 | Discharge: 2014-03-19 | Disposition: A | Discharge status: Home / Self Care | Payer: Self-pay | Source: Home / Self Care | Attending: Family Medicine | Admitting: Family Medicine

## 2014-03-19 DIAGNOSIS — M722 Plantar fascial fibromatosis: Secondary | ICD-10-CM

## 2014-03-19 MED ORDER — INDOMETHACIN 25 MG PO CAPS: 25.0000 mg | ORAL_CAPSULE | Freq: Three times a day (TID) | ORAL | Status: DC | PRN

## 2014-03-19 NOTE — ED Notes (Signed)
Pt. Stated, I started having left foot  Pain on the heal of my foot

## 2014-03-19 NOTE — ED Provider Notes (Signed)
CSN: [id number]     Arrival date & time 03/19/14  1758 History   First MD Initiated Contact with Patient 03/19/14 1907     Chief Complaint  Patient presents with  . Foot Pain   (Consider location/radiation/quality/duration/timing/severity/associated sxs/prior Treatment) HPI Comments: Patient reports intermittent left foot pain over last few months. States symptoms are exacerbated by a long day at work. 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 Has tried an occasional dose of tylenol with some relief. No specific injury reported. Reports herself to be otherwise healthy.  PCP: none  Patient is a 39 y.o. female presenting with lower extremity pain. The history is provided by the patient.  Foot Pain This is a chronic problem. Episode onset: began late Dec2015-early Jan 2016.    Past Medical History  Diagnosis Date  . Eczema   . Yeast infection     current- 03/25/13- has notified Dr Margarita GrizzleWoodruff. per patient --  PT STATES 04-01-2013  IMPROVING BUT STILL IRRITATED  . Right nephrolithiasis   . Right wrist tendonitis   . History of kidney stones   . H/O nephrolithotomy with removal of calculi     RIGHT FLANK OPEN INCISION AREA--  CHANGE DRESSING DAILY   Past Surgical History  Procedure Laterality Date  . Ectopic pregnancy surgery  2005  . Cesarean section with bilateral tubal ligation Right 10/18/2012    Procedure: REPEAT CESAREAN SECTION WITH BILATERAL TUBAL LIGATION;  Surgeon: Oliver PilaKathy W Richardson, MD;  Location: [facility];  Service: Obstetrics;  Laterality: Right; TOTAL OF 4 C SECTIONS  . Nephrolithotomy Right 03/03/2013    Procedure: Cystoscopy, Right Ureteroscopy with holmium laser With [place];  Surgeon: Milford Cageaniel Young Woodruff, MD;  Location: [facility];  Service: Urology;  Laterality: Right;  . Holmium laser application Right 03/03/2013    Procedure: HOLMIUM LASER APPLICATION;  Surgeon: Milford Cageaniel Young Woodruff, MD;  Location: [facility];  Service: Urology;  Laterality: Right;  .  Nephrolithotomy Right 03/18/2013    Procedure: RIGHT NEPHROLITHOTOMY PERCUTANEOUS   (1 STAGE) WITH HOLMIUM LASER;  Surgeon: Milford Cageaniel Young Woodruff, MD;  [facility];  Service: Urology;  Laterality: Right;  . Holmium laser application Right 03/18/2013    Procedure: HOLMIUM LASER APPLICATION;  Surgeon: Milford Cageaniel Young Woodruff, MD;  [facility];  Service: Urology;  Laterality: Right;  . Cystoscopy/retrograde/ureteroscopy Right 03/27/2013    Procedure: CYSTOSCOPY RIGHT URETEROSCOPY LASER LITHOTRIPSY  RIGHT RETROGRADE PYELOGRAM STENT PLACEMENT ON RIGHT;  Surgeon: Milford Cageaniel Young Woodruff, MD;  [facility];  Service: Urology;  Laterality: Right;  . Holmium laser application Right 03/27/2013    Procedure: HOLMIUM LASER APPLICATION;  Surgeon: Milford Cageaniel Young Woodruff, MD;  [facility];  Service: Urology;  Laterality: Right;  . Cystoscopy with retrograde pyelogram, ureteroscopy and stent placement Right 04/09/2013    Procedure: CYSTOSCOPY WITH STAGED URETEROSCOPY AND STENT EXCHANGE;  Surgeon: Milford Cageaniel Young Woodruff, MD;  [facility] [place];  Service: Urology;  Laterality: Right;  . Holmium laser application Right 04/09/2013    Procedure: HOLMIUM LASER APPLICATION;  Surgeon: Milford Cageaniel Young Woodruff, MD;  [facility] [place];  Service: Urology;  Laterality: Right;   Family History  Problem Relation Age of Onset  . Diabetes type II Mother   . Hypertension Mother   . Diabetes type II Sister    History  Substance Use Topics  . Smoking status: Former Smoker -- 0.25 packs/day for 10 years    Types: Cigarettes    Quit date: 03/11/2013  .  Smokeless tobacco: Never Used     Comment: - plans to quit  . Alcohol Use: Yes     Comment: occasional -rare   OB History    Gravida Para Term Preterm AB TAB SAB Ectopic Multiple Living   0 1 0 0 1  4     Review of Systems  All other systems reviewed and are negative.   Allergies  Review of patient's allergies indicates  no known allergies.  Home Medications   Prior to Admission medications   Medication Sig Start Date End Date Taking? Authorizing Provider  cephALEXin (KEFLEX) 500 MG capsule Take 1 capsule (500 mg total) by mouth 3 (three) times daily. Begin the day before your follow up appointment. 04/09/13   Natalia Leatherwood, MD  cyclobenzaprine (FLEXERIL) 10 MG tablet Take 1 tablet (10 mg total) by mouth 2 (two) times daily as needed for muscle spasms. 08/12/13   Marissa Sciacca, PA-C  HYDROcodone-acetaminophen (NORCO/VICODIN) 5-325 MG per tablet Take 1-2 tablets by mouth every 6 (six) hours as needed for moderate pain. 04/09/13   Natalia Leatherwood, MD  HYDROcodone-acetaminophen (NORCO/VICODIN) 5-325 MG per tablet Take 1 tablet by mouth every 6 (six) hours as needed for moderate pain or severe pain. 08/12/13   Marissa Sciacca, PA-C  hyoscyamine (LEVSIN SL) 0.125 MG SL tablet Place 1 tablet (0.125 mg total) under the tongue every 4 (four) hours as needed (bladder spasms). 03/07/13   Natalia Leatherwood, MD  ibuprofen (ADVIL,MOTRIN) 600 MG tablet Take 1 tablet (600 mg total) by mouth every 6 (six) hours as needed. 08/12/13   Marissa Sciacca, PA-C  indomethacin (INDOCIN) 25 MG capsule Take 1 capsule (25 mg total) by mouth 3 (three) times daily as needed for mild pain or moderate pain. As needed for pain 03/19/14   Mathis Fare Camielle Sizer, PA  senna-docusate (SENOKOT S) 8.6-50 MG per tablet Take 1 tablet by mouth daily. 03/21/13   Natalia Leatherwood, MD   BP 145/84 mmHg  Pulse 68  Temp(Src) 98.7 F (37.1 C) (Oral)  Resp 18  SpO2 100%  LMP 03/05/2014 Physical Exam  Constitutional: She is oriented to person, place, and time. She appears well-developed and well-nourished. No distress.  HENT:  Head: Normocephalic and atraumatic.  Cardiovascular: Normal rate.   Pulmonary/Chest: Effort normal.  Musculoskeletal:       Feet:  Outlined area is region of pain when it occurs. Pain increases with palpation of plantar surface of heel and  with flexion of left foot. No deformity, STS, induration or erythema.   Neurological: She is alert and oriented to person, place, and time.  Skin: Skin is warm and dry.  Psychiatric: She has a normal mood and affect. Her behavior is normal.  Nursing note and vitals reviewed.   ED Course  Procedures (including critical care time) Labs Review Labs Reviewed - No data to display  Imaging Review No results found.   MDM   1. Plantar fasciitis of left foot   Reviewed need for supportive foot wear, massage, ice, stretching exercises and NSAIDs as directed. Advised patient that this condition can be challenging to treat and will often reoccur.      Ria Clock, [place] 03/19/14 [phone]

## 2014-03-19 NOTE — Discharge Instructions (Signed)
Plantar Fasciitis (Heel Spur Syndrome) with Rehab The plantar fascia is a fibrous, ligament-like, soft-tissue structure that spans the bottom of the foot. Plantar fasciitis is a condition that causes pain in the foot due to inflammation of the tissue. SYMPTOMS   Pain and tenderness on the underneath side of the foot.  Pain that worsens with standing or walking. CAUSES  Plantar fasciitis is caused by irritation and injury to the plantar fascia on the underneath side of the foot. Common mechanisms of injury include:  Direct trauma to bottom of the foot.  Damage to a small nerve that runs under the foot where the main fascia attaches to the heel bone.  Stress placed on the plantar fascia due to bone spurs. RISK INCREASES WITH:   Activities that place stress on the plantar fascia (running, jumping, pivoting, or cutting).  Poor strength and flexibility.  Improperly fitted shoes.  Tight calf muscles.  Flat feet.  Failure to warm-up properly before activity.  Obesity. PREVENTION  Warm up and stretch properly before activity.  Allow for adequate recovery between workouts.  Maintain physical fitness:  Strength, flexibility, and endurance.  Cardiovascular fitness.  Maintain a health body weight.  Avoid stress on the plantar fascia.  Wear properly fitted shoes, including arch supports for individuals who have flat feet. PROGNOSIS  If treated properly, then the symptoms of plantar fasciitis usually resolve without surgery. However, occasionally surgery is necessary. RELATED COMPLICATIONS   Recurrent symptoms that may result in a chronic condition.  Problems of the lower back that are caused by compensating for the injury, such as limping.  Pain or weakness of the foot during push-off following surgery.  Chronic inflammation, scarring, and partial or complete fascia tear, occurring more often from repeated injections. TREATMENT  Treatment initially involves the use of  ice and medication to help reduce pain and inflammation. The use of strengthening and stretching exercises may help reduce pain with activity, especially stretches of the Achilles tendon. These exercises may be performed at home or with a therapist. Your caregiver may recommend that you use heel cups of arch supports to help reduce stress on the plantar fascia. Occasionally, corticosteroid injections are given to reduce inflammation. If symptoms persist for greater than 6 months despite non-surgical (conservative), then surgery may be recommended.  MEDICATION   If pain medication is necessary, then nonsteroidal anti-inflammatory medications, such as aspirin and ibuprofen, or other minor pain relievers, such as acetaminophen, are often recommended.  Do not take pain medication within 7 days before surgery.  Prescription pain relievers may be given if deemed necessary by your caregiver. Use only as directed and only as much as you need.  Corticosteroid injections may be given by your caregiver. These injections should be reserved for the most serious cases, because they may only be given a certain number of times. HEAT AND COLD  Cold treatment (icing) relieves pain and reduces inflammation. Cold treatment should be applied for 10 to 15 minutes every 2 to 3 hours for inflammation and pain and immediately after any activity that aggravates your symptoms. Use ice packs or massage the area with a piece of ice (ice massage).  Heat treatment may be used prior to performing the stretching and strengthening activities prescribed by your caregiver, physical therapist, or athletic trainer. Use a heat pack or soak the injury in warm water. SEEK IMMEDIATE MEDICAL CARE IF:  Treatment seems to offer no benefit, or the condition worsens.  Any medications produce adverse side effects. EXERCISES RANGE   OF MOTION (ROM) AND STRETCHING EXERCISES - Plantar Fasciitis (Heel Spur Syndrome) These exercises may help you  when beginning to rehabilitate your injury. Your symptoms may resolve with or without further involvement from your physician, physical therapist or athletic trainer. While completing these exercises, remember:   Restoring tissue flexibility helps normal motion to return to the joints. This allows healthier, less painful movement and activity.  An effective stretch should be held for at least 30 seconds.  A stretch should never be painful. You should only feel a gentle lengthening or release in the stretched tissue. RANGE OF MOTION - Toe Extension, Flexion  Sit with your right / left leg crossed over your opposite knee.  Grasp your toes and gently pull them back toward the top of your foot. You should feel a stretch on the bottom of your toes and/or foot.  Hold this stretch for __________ seconds.  Now, gently pull your toes toward the bottom of your foot. You should feel a stretch on the top of your toes and or foot.  Hold this stretch for __________ seconds. Repeat __________ times. Complete this stretch __________ times per day.  RANGE OF MOTION - Ankle Dorsiflexion, Active Assisted  Remove shoes and sit on a chair that is preferably not on a carpeted surface.  Place right / left foot under knee. Extend your opposite leg for support.  Keeping your heel down, slide your right / left foot back toward the chair until you feel a stretch at your ankle or calf. If you do not feel a stretch, slide your bottom forward to the edge of the chair, while still keeping your heel down.  Hold this stretch for __________ seconds. Repeat __________ times. Complete this stretch __________ times per day.  STRETCH - Gastroc, Standing  Place hands on wall.  Extend right / left leg, keeping the front knee somewhat bent.  Slightly point your toes inward on your back foot.  Keeping your right / left heel on the floor and your knee straight, shift your weight toward the wall, not allowing your back to  arch.  You should feel a gentle stretch in the right / left calf. Hold this position for __________ seconds. Repeat __________ times. Complete this stretch __________ times per day. STRETCH - Soleus, Standing  Place hands on wall.  Extend right / left leg, keeping the other knee somewhat bent.  Slightly point your toes inward on your back foot.  Keep your right / left heel on the floor, bend your back knee, and slightly shift your weight over the back leg so that you feel a gentle stretch deep in your back calf.  Hold this position for __________ seconds. Repeat __________ times. Complete this stretch __________ times per day. STRETCH - Gastrocsoleus, Standing  Note: This exercise can place a lot of stress on your foot and ankle. Please complete this exercise only if specifically instructed by your caregiver.   Place the ball of your right / left foot on a step, keeping your other foot firmly on the same step.  Hold on to the wall or a rail for balance.  Slowly lift your other foot, allowing your body weight to press your heel down over the edge of the step.  You should feel a stretch in your right / left calf.  Hold this position for __________ seconds.  Repeat this exercise with a slight bend in your right / left knee. Repeat __________ times. Complete this stretch __________ times per day.    STRENGTHENING EXERCISES - Plantar Fasciitis (Heel Spur Syndrome)  These exercises may help you when beginning to rehabilitate your injury. They may resolve your symptoms with or without further involvement from your physician, physical therapist or athletic trainer. While completing these exercises, remember:   Muscles can gain both the endurance and the strength needed for everyday activities through controlled exercises.  Complete these exercises as instructed by your physician, physical therapist or athletic trainer. Progress the resistance and repetitions only as guided. STRENGTH -  Towel Curls  Sit in a chair positioned on a non-carpeted surface.  Place your foot on a towel, keeping your heel on the floor.  Pull the towel toward your heel by only curling your toes. Keep your heel on the floor.  If instructed by your physician, physical therapist or athletic trainer, add ____________________ at the end of the towel. Repeat __________ times. Complete this exercise __________ times per day. STRENGTH - Ankle Inversion  Secure one end of a rubber exercise band/tubing to a fixed object (table, pole). Loop the other end around your foot just before your toes.  Place your fists between your knees. This will focus your strengthening at your ankle.  Slowly, pull your big toe up and in, making sure the band/tubing is positioned to resist the entire motion.  Hold this position for __________ seconds.  Have your muscles resist the band/tubing as it slowly pulls your foot back to the starting position. Repeat __________ times. Complete this exercises __________ times per day.  Document Released: 12/19/2004 Document Revised: 03/13/2011 Document Reviewed: 04/02/2008 [employer] Patient Information 2015 [employer]. This information is not intended to replace advice given to you by your health care provider. Make sure you discuss any questions you have with your health care provider.  

## 2014-06-11 ENCOUNTER — Encounter (HOSPITAL_COMMUNITY): Payer: Self-pay | Admitting: Urology

## 2014-11-08 ENCOUNTER — Emergency Department (HOSPITAL_COMMUNITY)
Admission: EM | Admit: 2014-11-08 | Discharge: 2014-11-08 | Disposition: A | Discharge status: Home / Self Care | Payer: Self-pay | Attending: Emergency Medicine | Admitting: Emergency Medicine

## 2014-11-08 ENCOUNTER — Emergency Department (HOSPITAL_COMMUNITY): Payer: Self-pay

## 2014-11-08 ENCOUNTER — Encounter (HOSPITAL_COMMUNITY): Payer: Self-pay | Admitting: Emergency Medicine

## 2014-11-08 DIAGNOSIS — Z87891 Personal history of nicotine dependence: Secondary | ICD-10-CM | POA: Insufficient documentation

## 2014-11-08 DIAGNOSIS — N390 Urinary tract infection, site not specified: Secondary | ICD-10-CM | POA: Insufficient documentation

## 2014-11-08 DIAGNOSIS — Z8619 Personal history of other infectious and parasitic diseases: Secondary | ICD-10-CM | POA: Insufficient documentation

## 2014-11-08 DIAGNOSIS — Z3202 Encounter for pregnancy test, result negative: Secondary | ICD-10-CM | POA: Insufficient documentation

## 2014-11-08 DIAGNOSIS — Z872 Personal history of diseases of the skin and subcutaneous tissue: Secondary | ICD-10-CM | POA: Insufficient documentation

## 2014-11-08 DIAGNOSIS — Z79899 Other long term (current) drug therapy: Secondary | ICD-10-CM | POA: Insufficient documentation

## 2014-11-08 DIAGNOSIS — Z87442 Personal history of urinary calculi: Secondary | ICD-10-CM | POA: Insufficient documentation

## 2014-11-08 LAB — BASIC METABOLIC PANEL
Anion gap: 6 (ref 5–15)
BUN: 12 mg/dL (ref 6–20)
CALCIUM: 9.5 mg/dL (ref 8.9–10.3)
CO2: 27 mmol/L (ref 22–32)
CREATININE: 0.68 mg/dL (ref 0.44–1.00)
Chloride: 106 mmol/L (ref 101–111)
GFR calc non Af Amer: >60 mL/min (ref >60)
Glucose, Bld: 107 mg/dL — ABNORMAL HIGH (ref 65–99)
Potassium: 3.5 mmol/L (ref 3.5–5.1)
Sodium: 139 mmol/L (ref 135–145)

## 2014-11-08 LAB — CBC WITH DIFFERENTIAL/PLATELET
BASOS PCT: 0 %
Basophils Absolute: 0 10*3/uL (ref 0.0–0.1)
Eosinophils Absolute: 0.3 10*3/uL (ref 0.0–0.7)
Eosinophils Relative: 2 %
HCT: 39.4 % (ref 36.0–46.0)
Hemoglobin: 13.2 g/dL (ref 12.0–15.0)
Lymphocytes Relative: 23 %
Lymphs Abs: 2.8 10*3/uL (ref 0.7–4.0)
MCH: 26.7 pg (ref 26.0–34.0)
MCHC: 33.5 g/dL (ref 30.0–36.0)
MCV: 79.6 fL (ref 78.0–100.0)
MONO ABS: 0.9 10*3/uL (ref 0.1–1.0)
Monocytes Relative: 7 %
Neutro Abs: 8.4 10*3/uL — ABNORMAL HIGH (ref 1.7–7.7)
Neutrophils Relative %: 68 %
Platelets: 410 10*3/uL — ABNORMAL HIGH (ref 150–400)
RBC: 4.95 MIL/uL (ref 3.87–5.11)
RDW: 13.8 % (ref 11.5–15.5)
WBC: 12.5 10*3/uL — ABNORMAL HIGH (ref 4.0–10.5)

## 2014-11-08 LAB — PREGNANCY, URINE: Preg Test, Ur: NEGATIVE

## 2014-11-08 LAB — URINALYSIS, ROUTINE W REFLEX MICROSCOPIC
BILIRUBIN URINE: NEGATIVE
GLUCOSE, UA: NEGATIVE mg/dL
KETONES UR: NEGATIVE mg/dL
Nitrite: POSITIVE — AB
Protein, ur: 30 mg/dL — AB
Specific Gravity, Urine: 1.017 (ref 1.005–1.030)
Urobilinogen, UA: 1 mg/dL (ref 0.0–1.0)
pH: 6 (ref 5.0–8.0)

## 2014-11-08 LAB — URINE MICROSCOPIC-ADD ON

## 2014-11-08 MED ORDER — IBUPROFEN 800 MG PO TABS: 800.0000 mg | ORAL_TABLET | Freq: Three times a day (TID) | ORAL | Status: DC

## 2014-11-08 MED ORDER — IBUPROFEN 800 MG PO TABS
800.0000 mg | ORAL_TABLET | Freq: Once | ORAL | Status: AC
  Administered 2014-11-08: 800 mg via ORAL
  Filled 2014-11-08: qty 1

## 2014-11-08 MED ORDER — CEPHALEXIN 500 MG PO CAPS: 500.0000 mg | ORAL_CAPSULE | Freq: Two times a day (BID) | ORAL | Status: DC

## 2014-11-08 NOTE — ED Provider Notes (Signed)
CSN: [id number]     Arrival date & time 11/08/14  1152 History  By signing my name below, I, [name], attest that this documentation has been prepared under the direction and in the presence of Glean Hess, [place]. Electronically Signed: Elon [name] ED Scribe. 11/08/2014. 10:01 PM.    Chief Complaint  Patient presents with  . Dysuria    The history is provided by the patient. No language interpreter was used.    HPI Comments: [name] is a 39 y.o. female with a PMH of nephrolithiasis who presents to the Emergency Department complaining of hematuria, which started 5 days ago. Associated symptoms include urinary frequency, suprapubic abdominal pain, and left-sided flank pain.  She describes her pain as pressure. She denies aggravating or alleviating factors. She denies dysuria, fever, chills, n/v/d/c, vaginal discharge.   Past Medical History  Diagnosis Date  . Eczema   . Yeast infection     current- 03/25/13- has notified Dr Margarita Grizzle. per patient --  PT STATES 04-01-2013  IMPROVING BUT STILL IRRITATED  . Right nephrolithiasis   . Right wrist tendonitis   . History of kidney stones   . H/O nephrolithotomy with removal of calculi     RIGHT FLANK OPEN INCISION AREA--  CHANGE DRESSING DAILY   Past Surgical History  Procedure Laterality Date  . Ectopic pregnancy surgery  2005  . Cesarean section with bilateral tubal ligation Right 10/18/2012    Procedure: REPEAT CESAREAN SECTION WITH BILATERAL TUBAL LIGATION;  Surgeon: Oliver Pila, MD;  Location: [facility];  Service: Obstetrics;  Laterality: Right; TOTAL OF 4 C SECTIONS  . Nephrolithotomy Right 03/03/2013    Procedure: Cystoscopy, Right Ureteroscopy with holmium laser With Strent;  Surgeon: Milford Cage, MD;  Location: [facility];  Service: Urology;  Laterality: Right;  . Holmium laser application Right 03/03/2013    Procedure: HOLMIUM LASER APPLICATION;  Surgeon: Milford Cage, MD;  Location: [facility];   Service: Urology;  Laterality: Right;  . Nephrolithotomy Right 03/18/2013    Procedure: RIGHT NEPHROLITHOTOMY PERCUTANEOUS   (1 STAGE) WITH HOLMIUM LASER;  Surgeon: Milford Cage, MD;  Location: [facility];  Service: Urology;  Laterality: Right;  . Holmium laser application Right 03/18/2013    Procedure: HOLMIUM LASER APPLICATION;  Surgeon: Milford Cage, MD;  Location: [facility];  Service: Urology;  Laterality: Right;  . Cystoscopy/retrograde/ureteroscopy Right 03/27/2013    Procedure: CYSTOSCOPY RIGHT URETEROSCOPY LASER LITHOTRIPSY  RIGHT RETROGRADE PYELOGRAM STENT PLACEMENT ON RIGHT;  Surgeon: Milford Cage, MD;  Location: [facility];  Service: Urology;  Laterality: Right;  . Holmium laser application Right 03/27/2013    Procedure: HOLMIUM LASER APPLICATION;  Surgeon: Milford Cage, MD;  Location: [facility];  Service: Urology;  Laterality: Right;  . Cystoscopy with retrograde pyelogram, ureteroscopy and stent placement Right 04/09/2013    Procedure: CYSTOSCOPY WITH STAGED URETEROSCOPY AND STENT EXCHANGE;  Surgeon: Milford Cage, MD;  Location: [facility];  Service: Urology;  Laterality: Right;  . Holmium laser application Right 04/09/2013    Procedure: HOLMIUM LASER APPLICATION;  Surgeon: Milford Cage, MD;  Location: [facility];  Service: Urology;  Laterality: Right;   Family History  Problem Relation Age of Onset  . Diabetes type II Mother   . Hypertension Mother   . Diabetes type II Sister    Social History  Substance Use Topics  . Smoking status: Former Smoker -- 0.25 packs/day for 10 years    Types:  Cigarettes    Quit date: 03/11/2013  . Smokeless tobacco: Never Used     Comment: - plans to quit  . Alcohol Use: Yes     Comment: occasional -rare   OB History    Gravida Para Term Preterm AB TAB SAB Ectopic Multiple Living   5 4 4  0 1 0 0 1  4      Review of Systems  Constitutional: Negative for fever and chills.   Respiratory: Negative for shortness of breath.   Cardiovascular: Negative for chest pain.  Gastrointestinal: Positive for abdominal pain. Negative for nausea, vomiting, diarrhea and constipation.  Genitourinary: Positive for frequency, hematuria and flank pain. Negative for dysuria, urgency, vaginal bleeding, vaginal discharge and vaginal pain.  Neurological: Negative for dizziness, light-headedness and headaches.  All other systems reviewed and are negative.     Allergies  Review of patient's allergies indicates no known allergies.  Home Medications   Prior to Admission medications   Medication Sig Start Date End Date Taking? Authorizing Provider  cephALEXin (KEFLEX) 500 MG capsule Take 1 capsule (500 mg total) by mouth 2 (two) times daily. 11/08/14   Mady GemmaElizabeth C Eulalah Rupert, PA-C  cyclobenzaprine (FLEXERIL) 10 MG tablet Take 1 tablet (10 mg total) by mouth 2 (two) times daily as needed for muscle spasms. 08/12/13   Marissa Sciacca, PA-C  HYDROcodone-acetaminophen (NORCO/VICODIN) 5-325 MG per tablet Take 1-2 tablets by mouth every 6 (six) hours as needed for moderate pain. 04/09/13   Natalia Leatherwoodaniel Woodruff, MD  HYDROcodone-acetaminophen (NORCO/VICODIN) 5-325 MG per tablet Take 1 tablet by mouth every 6 (six) hours as needed for moderate pain or severe pain. 08/12/13   Marissa Sciacca, PA-C  hyoscyamine (LEVSIN SL) 0.125 MG SL tablet Place 1 tablet (0.125 mg total) under the tongue every 4 (four) hours as needed (bladder spasms). 03/07/13   Natalia Leatherwoodaniel Woodruff, MD  ibuprofen (ADVIL,MOTRIN) 800 MG tablet Take 1 tablet (800 mg total) by mouth 3 (three) times daily. 11/08/14   Mady GemmaElizabeth C Dawsyn Ramsaran, PA-C  indomethacin (INDOCIN) 25 MG capsule Take 1 capsule (25 mg total) by mouth 3 (three) times daily as needed for mild pain or moderate pain. As needed for pain 03/19/14   Mathis FareJennifer Lee H Presson, PA  senna-docusate (SENOKOT S) 8.6-50 MG per tablet Take 1 tablet by mouth daily. 03/21/13   Natalia Leatherwoodaniel Woodruff, MD    BP  146/85 mmHg  Pulse 61  Temp(Src) 98.2 F (36.8 C) (Oral)  Resp 18  SpO2 100%  LMP 10/08/2014 Physical Exam  Constitutional: She is oriented to person, place, and time. She appears well-developed and well-nourished. No distress.  HENT:  Head: Normocephalic and atraumatic.  Right Ear: External ear normal.  Left Ear: External ear normal.  Nose: Nose normal.  Mouth/Throat: Oropharynx is clear and moist.  Eyes: Conjunctivae and EOM are normal. Pupils are equal, round, and reactive to light. Right eye exhibits no discharge. Left eye exhibits no discharge. No scleral icterus.  Neck: Normal range of motion. Neck supple.  Cardiovascular: Normal rate, regular rhythm, normal heart sounds and intact distal pulses.   Pulmonary/Chest: Effort normal and breath sounds normal. No respiratory distress. She has no wheezes. She has no rales.  Abdominal: Soft. Bowel sounds are normal. She exhibits no distension and no mass. There is tenderness. There is no rebound and no guarding.  Mild TTP in left suprapubic region. No rebound, guarding, or masses. No CVA tenderness.  Musculoskeletal: Normal range of motion. She exhibits no edema or tenderness.  Neurological: She is  alert and oriented to person, place, and time.  Skin: Skin is warm and dry. She is not diaphoretic.  Psychiatric: She has a normal mood and affect. Her behavior is normal.  Nursing note and vitals reviewed.   ED Course  Procedures (including critical care time)  DIAGNOSTIC STUDIES: Oxygen Saturation is 98% on RA, normal by my interpretation.    COORDINATION OF CARE: 2:26 PM Will order labs.  Patient acknowledges and agrees with plan.    Labs Review Labs Reviewed  URINALYSIS, ROUTINE W REFLEX MICROSCOPIC (NOT AT [facility]) - Abnormal; Notable for the following:    Color, Urine RED (*)    APPearance TURBID (*)    Hgb urine dipstick LARGE (*)    Protein, ur 30 (*)    Nitrite POSITIVE (*)    Leukocytes, UA LARGE (*)    All other  components within normal limits  CBC WITH DIFFERENTIAL/PLATELET - Abnormal; Notable for the following:    WBC 12.5 (*)    Platelets 410 (*)    Neutro Abs 8.4 (*)    All other components within normal limits  BASIC METABOLIC PANEL - Abnormal; Notable for the following:    Glucose, Bld 107 (*)    All other components within normal limits  URINE MICROSCOPIC-ADD ON - Abnormal; Notable for the following:    Bacteria, UA MANY (*)    All other components within normal limits  PREGNANCY, URINE    Imaging Review US Renal  11/08/2014  CLINICAL DATA:  Patient with left flank pain. History of urinary tract infection. EXAM: RENAL / URINARY TRACT ULTRASOUND COMPLETE COMPARISON:  CT abdomen pelvis 03/04/2013 FINDINGS: Right Kidney: Length: 9.4 cm. There is mild fullness of the superior pole renal collecting system. There is a shadowing echogenic structure within the renal collecting system measuring up to 1.8 cm. Normal renal cortical thickness and echogenicity. Left Kidney: Length: 11.5 cm. No hydronephrosis. Normal renal cortical thickness and echogenicity. There is a 1.5 x 1.3 x 1.2 cm cyst within the superior pole. Bladder: Appears normal for degree of bladder distention. IMPRESSION: Mild fullness of the superior pole of the right renal collecting system. No left-sided hydronephrosis. Shadowing echogenic structure within right kidney, not entirely specific however favored to represent a stone. Electronically Signed   By: Annia Belt M.D.   On: 11/08/2014 16:54     I have personally reviewed and evaluated these images and lab results as part of my medical decision-making.   EKG Interpretation None      MDM   Final diagnoses:  Acute UTI    39 year old female presents with hematuria, which she states has been present for the past 5 days. Reports associated suprapubic pain and left flank pain. Also states she has experienced urinary frequency. Denies dysuria, fever, chills, n/v/d/c, vaginal  discharge.   Patient is afebrile. Vital signs stable. Heart RRR. Lungs clear to auscultation bilaterally. Mild TTP in left suprapubic region. No rebound, guarding, or masses. No CVA tenderness.   Given ibuprofen for pain. UA remarkable for large hemoglobin, positive nitrites, large leukocytes, TNTC WBC, TNTC RBC on microscopic. CBC with mild leukocytosis of 12.5. BMP within normal limits.  Imaging remarkable for mild fullness of the superior pole of the right renal collecting system, shadowing echogenic structure in right kidney favored to represent a stone, no left sided hydronephrosis. Patient is non-toxic and well-appearing. Feel she is stable for discharge at this time. Will treat UTI with keflex and pain with ibuprofen. Patient to follow up with  [facility] and with urology for further evaluation and management. Return precautions discussed. Patient verbalizes her understanding and is in agreement with plan.  BP 146/85 mmHg  Pulse 61  Temp(Src) 98.2 F (36.8 C) (Oral)  Resp 18  SpO2 100%  LMP 10/08/2014  I personally performed the services described in this documentation, which was scribed in my presence. The recorded information has been reviewed and is accurate.    Mady Gemma, PA-C 11/08/14 2204  Bethann Berkshire, MD 11/09/14 [phone]

## 2014-11-08 NOTE — ED Notes (Signed)
Pt reported lower abd and groin pain/pressure, foul odor, hematuria, urinary frequency/urgency, and amber color urine. Denies fever.

## 2014-11-08 NOTE — Discharge Instructions (Signed)
1. Medications: keflex, ibuprofen, usual home medications 2. Treatment: rest, drink plenty of fluids 3. Follow Up: please followup with [facility] and with urology this week for discussion of your diagnoses and further evaluation after today's visit; please return to the ER for high fever, severe pain, persistent vomiting, new or worsening symptoms   Urinary Tract Infection Urinary tract infections (UTIs) can develop anywhere along your urinary tract. Your urinary tract is your body's drainage system for removing wastes and extra water. Your urinary tract includes two kidneys, two ureters, a bladder, and a urethra. Your kidneys are a pair of bean-shaped organs. Each kidney is about the size of your fist. They are located below your ribs, one on each side of your spine. CAUSES Infections are caused by microbes, which are microscopic organisms, including fungi, viruses, and bacteria. These organisms are so small that they can only be seen through a microscope. Bacteria are the microbes that most commonly cause UTIs. SYMPTOMS  Symptoms of UTIs may vary by age and gender of the patient and by the location of the infection. Symptoms in young women typically include a frequent and intense urge to urinate and a painful, burning feeling in the bladder or urethra during urination. Older women and men are more likely to be tired, shaky, and weak and have muscle aches and abdominal pain. A fever may mean the infection is in your kidneys. Other symptoms of a kidney infection include pain in your back or sides below the ribs, nausea, and vomiting. DIAGNOSIS To diagnose a UTI, your caregiver will ask you about your symptoms. Your caregiver will also ask you to provide a urine sample. The urine sample will be tested for bacteria and white blood cells. White blood cells are made by your body to help fight infection. TREATMENT  Typically, UTIs can be treated with medication. Because most UTIs are  caused by a bacterial infection, they usually can be treated with the use of antibiotics. The choice of antibiotic and length of treatment depend on your symptoms and the type of bacteria causing your infection. HOME CARE INSTRUCTIONS  If you were prescribed antibiotics, take them exactly as your caregiver instructs you. Finish the medication even if you feel better after you have only taken some of the medication.  Drink enough water and fluids to keep your urine clear or pale yellow.  Avoid caffeine, tea, and carbonated beverages. They tend to irritate your bladder.  Empty your bladder often. Avoid holding urine for long periods of time.  Empty your bladder before and after sexual intercourse.  After a bowel movement, women should cleanse from front to back. Use each tissue only once. SEEK MEDICAL CARE IF:   You have back pain.  You develop a fever.  Your symptoms do not begin to resolve within 3 days. SEEK IMMEDIATE MEDICAL CARE IF:   You have severe back pain or lower abdominal pain.  You develop chills.  You have nausea or vomiting.  You have continued burning or discomfort with urination. MAKE SURE YOU:   Understand these instructions.  Will watch your condition.  Will get help right away if you are not doing well or get worse.   This information is not intended to replace advice given to you by your health care provider. Make sure you discuss any questions you have with your health care provider.   Document Released: 09/28/2004 Document Revised: 09/09/2014 Document Reviewed: 01/27/2011 Elsevier Interactive Patient Education [employer]   Emergency  Department Resource Guide 1) Find a Doctor and Pay Out of Pocket Although you won't have to find out who is covered by your insurance plan, it is a good idea to ask around and get recommendations. You will then need to call the office and see if the doctor you have chosen will accept you as a new patient and what  types of options they offer for patients who are self-pay. Some doctors offer discounts or will set up payment plans for their patients who do not have insurance, but you will need to ask so you aren't surprised when you get to your appointment.  2) Contact Your Local Health Department Not all health departments have doctors that can see patients for sick visits, but many do, so it is worth a call to see if yours does. If you don't know where your local health department is, you can check in your phone book. The CDC also has a tool to help you locate your state's health department, and many state websites also have listings of all of their local health departments.  3) Find a Walk-in Clinic If your illness is not likely to be very severe or complicated, you may want to try a walk in clinic. These are popping up all over the country in pharmacies, drugstores, and shopping centers. They're usually staffed by nurse practitioners or physician assistants that have been trained to treat common illnesses and complaints. They're usually fairly quick and inexpensive. However, if you have serious medical issues or chronic medical problems, these are probably not your best option.  No Primary Care Doctor: - Call Health Connect at  [phone] - they can help you locate a primary care doctor that  accepts your insurance, provides certain services, etc. - Physician Referral Service- [phone]  Chronic Pain Problems: Organization         Address  Phone   Notes  [facility]  [phone] Patients need to be referred by their primary care doctor.   Medication Assistance: Organization         Address  Phone   Notes  [facility] [address] [place], [place] [address] [phone]) [phone] --Must be a resident of [place] -- Must have NO insurance coverage whatsoever (no Medicaid/ Medicare, etc.) -- The pt. MUST have a primary care doctor that  directs their care regularly and follows them in the community   Med[facility]ist  [phone]   [employer]  [phone]    Agencies that provide inexpensive medical care: Organization         Address  Phone   Notes  [facility]  [phone]) [phone]   [facility]    [phone]) [phone]   [facility] [address] [place], [place] [address] [phone]   [facility] [address], [place] [phone]) [phone]   [facility]anned Parenthood    [phone]   [facility]    [phone]   [facility]  [address], [place] Phone:  [phone]) [phone], Fax:  [phone] Hours of Operation:  9 am - 6 pm, M-F.  Also accepts Medicaid/Medicare and self-pay.  [facility]  [address], [place] Phone: [phone](336) [phone], Fax: [phone]. Hours of Operation:  8:30 am - 5:30 pm, M-F.  Also accepts Medicaid and self-pay.  [facility] [place] [address], [place] Phone: [phone]   [facility] [address] Natasha Bence [place], [place] [phone], Ext. [phone] Mondays & Thursdays: 7-9 AM.  First 15 patients are seen on a first come, first serve basis.    Medicaid-accepting [facility] Providers:  Organization         Address  Phone   Notes  [facility] [address], [place] [phone] Also accepts self-pay patients.  [facility] [address] Laurell Josephs [place], [place]  [phone]   [facility] [address], [place] [phone]   [facility] [address] [place] [phone]   Renaye Rakers [address], [place]   [phone] Only accepts [place] patients after they have their name applied to their card.   Self-Pay (no insurance) in [facility]:  Organization          Address  Phone   Notes  Sickle Cell Patients, [facility] [address] [place], [place] [phone]   [facility] [address] [place], [place] [phone]   [facility] [place]  [address], [place] [phone]   [facility]/Dr. Osei-Bonsu  [address] [place] or [address], [place] [phone] Phone number for both [place] and [place] locations is the same.  [facility] [address], [place] [phone]   [facility] [address] [place] or [address] [phone]   [facility] [address], [place] [phone], phone; [phone], fax Sees patients 1st and 3rd Saturday of every month.  Must not qualify for public or private insurance (i.e. Medicaid, Medicare, [place] Health Choice, Veterans' Benefits)  Household income should be no more than 200% of the poverty level The clinic cannot treat you if you are pregnant or think you are pregnant  Sexually transmitted diseases are not treated at the clinic.    Dental Care: Organization         Address  Phone  Notes  [facility] [address] [place], [place] [phone] Accepts children up to age 64 who are enrolled in [place] or [place] Health Choice; pregnant women with a Medicaid card; and children who have applied for Medicaid or [place] Health Choice, but were declined, whose parents can pay a reduced fee at time of service.  [facility] Department of [facility]  [address], [place] [phone] Accepts children up to age 74 who are enrolled in [place] or [place] Health Choice; pregnant women with a Medicaid card; and children who have applied for Medicaid or [place] Health Choice, but were declined, whose parents can pay a reduced fee at time of  service.  [facility]  [address] [place], [place] [phone] Patients are seen by appointment only. Walk-ins are not accepted. [facility] will see patients 33 years of age and older. Monday - Tuesday (8am-5pm) Most Wednesdays (8:30-5pm) $30 per visit, cash only  [facility]  [address], [facility] [phone] Patients are seen by appointment only. Walk-ins are not accepted. [facility] will see patients 92 years of age and older. One  Wednesday Evening (Monthly: Volunteer Based).  $30 per visit, cash only  [employer] of Dentistry Clinics  [phone] for adults; Children under age 78, call Graduate Pediatric Dentistry at [phone]. Children aged 49-14, please call [phone] to request a pediatric application.  Dental services are provided in all areas of dental care including fillings, crowns and bridges, complete and partial dentures, implants, gum treatment, root canals, and extractions. Preventive care is also provided. Treatment is provided to both adults and children. Patients are selected via a lottery and there is often a waiting list.   [facility] [address] [place]  [phone] [email or web address]   [facility] [address] [place], [place] [phone], Ext. [phone] Second and Fourth Thursday of each month, opens at 6:30 AM; Clinic ends at 9 AM.  Patients are seen on a first-come first-served basis, and a limited number are seen during each clinic.   [facility]  [address] Ether Griffins [place], [place] [phone]   Eligibility Requirements You must have lived in [place], [place], or [place] counties for at least the last three months.   You cannot be eligible for state or federal sponsored [place], including [employer], [place], or [employer].   You generally cannot be eligible for healthcare insurance through your employer.     How to apply: Eligibility screenings are held every Tuesday and Wednesday afternoon from 1:00 pm until 4:00 pm. You do not need an appointment for the interview!  [facility] [address] [place], [place] [phone]   [facility]  [phone]   [facility]  [phone]   [facility]  [phone]    Behavioral Health Resources in the Community: Intensive Outpatient Programs Organization         Address  Phone  Notes  [facility] [address] [place], [place] [phone]   [facility] Outpatient [address], [place], [place] [phone]   [facility] [address] [place], [place]  [phone]   [facility] Mental Health [address],  [place], [place] [phone] or [phone]   Substance Abuse Resources Organization         Address  Phone  Notes  [facility]  [phone]   [facility]  [phone]   [facility]  [phone]   Floydene Flock  [phone]   Residential & Outpatient Substance Abuse Program  [phone]   Psychological Services Organization         Address  Phone  Notes  [facility]  [phone]   [facility]  [phone]   [facility] Mental Health [address] [place] [phone] or [phone]    Mobile Crisis Teams Organization         Address  Phone  Notes  [facility], Mobile Crisis Care Unit  [phone]   [facility]  [address] [place], [place] [phone]   Doristine Locks [address] [place] [phone]    Self-Help/Support Groups Organization         Address  Phone             Notes  [facility] [place] - variety of support groups  336- I7437963 Call for more information  Narcotics Anonymous (NA), [facility] [address]  Dr, [employer] [place]  2 meetings at this location  Residential Treatment Programs Organization         Address  Phone  Notes  [facility] [address]    [place]  [phone]   [facility]  [address] [place] [address], [place], [place] [phone]   [facility] [address] [place], [place] [phone] Admissions: 8am-3pm M-F  [facility] [address]    [place], [place] [phone]   [facility] [address] [place], [place], [place] [phone]   [facility] [address]  [place], [place] [phone]   [facility] - Intensive Outpatient [address] Laurell Josephs [address], [place], [place] [phone]   [facility] ([facility] [address] [place]  [place], [place] [phone] or [phone]   [facility] ([facility]) [address], [place], [place] [phone] Accepts Medicaid  [facility] [address],  [place] [phone] Substance Abuse/Addiction Treatment   [facility] Organization         Address  Phone  Notes  [facility]  [phone]   Angie Fava, PhD [address] Ervin Knack [place], [place]   [phone] or [phone]   [facility]   [address] [place], [place] [phone]   [facility] [address], [place], [place] [phone] Insurance/Medicaid/sponsorship through [facility] [address]                                    [place], [place] [phone] Therapy/tele-psych/case  [facility] [address], [place] [phone]    Dr. Lolly Mustache  [phone]   [facility] [place]  [employer] [facility] Dept. 1) [address], [place] 2) [address] [place] 3)  [address], [place] [phone]  [phone]   [facility] Child Abuse  Hotline [phone] or [phone] (After Hours)

## 2014-11-16 ENCOUNTER — Emergency Department (HOSPITAL_COMMUNITY): Payer: Self-pay

## 2014-11-16 ENCOUNTER — Encounter (HOSPITAL_COMMUNITY): Payer: Self-pay | Admitting: Emergency Medicine

## 2014-11-16 ENCOUNTER — Emergency Department (HOSPITAL_COMMUNITY)
Admission: EM | Admit: 2014-11-16 | Discharge: 2014-11-16 | Disposition: A | Discharge status: Home / Self Care | Payer: Self-pay | Attending: Emergency Medicine | Admitting: Emergency Medicine

## 2014-11-16 DIAGNOSIS — Z87442 Personal history of urinary calculi: Secondary | ICD-10-CM | POA: Insufficient documentation

## 2014-11-16 DIAGNOSIS — S3210XA Unspecified fracture of sacrum, initial encounter for closed fracture: Secondary | ICD-10-CM

## 2014-11-16 DIAGNOSIS — Z872 Personal history of diseases of the skin and subcutaneous tissue: Secondary | ICD-10-CM | POA: Insufficient documentation

## 2014-11-16 DIAGNOSIS — Z87891 Personal history of nicotine dependence: Secondary | ICD-10-CM | POA: Insufficient documentation

## 2014-11-16 DIAGNOSIS — Y998 Other external cause status: Secondary | ICD-10-CM | POA: Insufficient documentation

## 2014-11-16 DIAGNOSIS — W1839XA Other fall on same level, initial encounter: Secondary | ICD-10-CM | POA: Insufficient documentation

## 2014-11-16 DIAGNOSIS — Y9289 Other specified places as the place of occurrence of the external cause: Secondary | ICD-10-CM | POA: Insufficient documentation

## 2014-11-16 DIAGNOSIS — Y9389 Activity, other specified: Secondary | ICD-10-CM | POA: Insufficient documentation

## 2014-11-16 DIAGNOSIS — S3219XA Other fracture of sacrum, initial encounter for closed fracture: Secondary | ICD-10-CM | POA: Insufficient documentation

## 2014-11-16 DIAGNOSIS — Z792 Long term (current) use of antibiotics: Secondary | ICD-10-CM | POA: Insufficient documentation

## 2014-11-16 MED ORDER — HYDROCODONE-ACETAMINOPHEN 5-325 MG PO TABS
1.0000 | ORAL_TABLET | Freq: Once | ORAL | Status: AC
  Administered 2014-11-16: 1 via ORAL
  Filled 2014-11-16: qty 1

## 2014-11-16 MED ORDER — HYDROCODONE-ACETAMINOPHEN 5-325 MG PO TABS: 1.0000 | ORAL_TABLET | ORAL | Status: DC | PRN

## 2014-11-16 NOTE — ED Notes (Addendum)
Pt. reports low back pain onset last week after a fall unrelieved by OTC pain medications, denies LOC , pain increases with movement .

## 2014-11-16 NOTE — Discharge Instructions (Signed)
Read the information below.  Use the prescribed medication as directed.  Please discuss all new medications with your pharmacist.  Do not take additional tylenol while taking the prescribed pain medication to avoid overdose.  You may return to the Emergency Department at any time for worsening condition or any new symptoms that concern you.    You have broken your Sacrum.  Please follow up with the orthopedist.   If you develop fevers, loss of control of bowel or bladder, weakness or numbness in your legs, uncontrolled pain, or are unable to walk, return to the ER for a recheck.

## 2014-11-16 NOTE — ED Provider Notes (Signed)
CSN: [id number]     Arrival date & time 11/16/14  1957 History  By signing my name below, I, Ronney Lion, attest that this documentation has been prepared under the direction and in the presence of [facility], PA-C. Electronically Signed: Ronney Lion, ED Scribe. 11/16/2014. 9:56 PM.    Chief Complaint  Patient presents with  . Back Pain   The history is provided by the patient. No language interpreter was used.   HPI Comments: [name] is a 39 y.o. female who presents to the Emergency Department complaining of gradually worsening, constant, aching, low back pain that began 3 days ago after a fall. She states that while running after her children, she had slipped on the carpet on her wooden steps before falling down and hitting every step. She states she finally stopped her fall on the bottom step with both of her feet, causing the force to shoot upwards into her lower back. She describes her pain as "excruciating." Sitting and bearing weight on her left foot exacerbate the pain. Patient had tried taking ibuprofen 400 mg, Tylenol, and Aleve, all with no relief to her symptoms. She denies bowel or bladder incontinence, saddle anesthesia, groin pain, dizziness, fever, abdominal pain, or urinary symptom. Patient drove herself here today, but she states her sons can drive her home today.  Past Medical History  Diagnosis Date  . Eczema   . Yeast infection     current- 03/25/13- has notified Dr Margarita Grizzle. per patient --  PT STATES 04-01-2013  IMPROVING BUT STILL IRRITATED  . Right nephrolithiasis   . Right wrist tendonitis   . History of kidney stones   . H/O nephrolithotomy with removal of calculi     RIGHT FLANK OPEN INCISION AREA--  CHANGE DRESSING DAILY   Past Surgical History  Procedure Laterality Date  . Ectopic pregnancy surgery  2005  . Cesarean section with bilateral tubal ligation Right 10/18/2012    Procedure: REPEAT CESAREAN SECTION WITH BILATERAL TUBAL LIGATION;  Surgeon: Oliver Pila, MD;  Location: [facility];  Service: Obstetrics;  Laterality: Right; TOTAL OF 4 C SECTIONS  . Nephrolithotomy Right 03/03/2013    Procedure: Cystoscopy, Right Ureteroscopy with holmium laser With Strent;  Surgeon: Milford Cage, MD;  Location: [facility];  Service: Urology;  Laterality: Right;  . Holmium laser application Right 03/03/2013    Procedure: HOLMIUM LASER APPLICATION;  Surgeon: Milford Cage, MD;  Location: [facility];  Service: Urology;  Laterality: Right;  . Nephrolithotomy Right 03/18/2013    Procedure: RIGHT NEPHROLITHOTOMY PERCUTANEOUS   (1 STAGE) WITH HOLMIUM LASER;  Surgeon: Milford Cage, MD;  Location: [facility];  Service: Urology;  Laterality: Right;  . Holmium laser application Right 03/18/2013    Procedure: HOLMIUM LASER APPLICATION;  Surgeon: Milford Cage, MD;  Location: [facility];  Service: Urology;  Laterality: Right;  . Cystoscopy/retrograde/ureteroscopy Right 03/27/2013    Procedure: CYSTOSCOPY RIGHT URETEROSCOPY LASER LITHOTRIPSY  RIGHT RETROGRADE PYELOGRAM STENT PLACEMENT ON RIGHT;  Surgeon: Milford Cage, MD;  Location: [facility];  Service: Urology;  Laterality: Right;  . Holmium laser application Right 03/27/2013    Procedure: HOLMIUM LASER APPLICATION;  Surgeon: Milford Cage, MD;  Location: [facility];  Service: Urology;  Laterality: Right;  . Cystoscopy with retrograde pyelogram, ureteroscopy and stent placement Right 04/09/2013    Procedure: CYSTOSCOPY WITH STAGED URETEROSCOPY AND STENT EXCHANGE;  Surgeon: Milford Cage, MD;  Location: [facility];  Service: Urology;  Laterality:  Right;  . Holmium laser application Right 04/09/2013    Procedure: HOLMIUM LASER APPLICATION;  Surgeon: Milford Cageaniel Young Woodruff, MD;  [facility];  Service: Urology;  Laterality: Right;   Family History  Problem Relation Age of Onset  . Diabetes type II Mother   . Hypertension Mother   . Diabetes type II Sister     Social History  Substance Use Topics  . Smoking status: Former Smoker -- 0.25 packs/day for 0 years    Types: Cigarettes    Quit date: 03/11/2013  . Smokeless tobacco: Never Used     Comment: - plans to quit  . Alcohol Use: Yes   OB History    Gravida Para Term Preterm AB TAB SAB Ectopic Multiple Living   5 4 4  0 1 0 0 1  4     Review of Systems  Constitutional: Negative for fever.  Cardiovascular: Negative for leg swelling.  Gastrointestinal: Negative for abdominal pain.  Genitourinary: Negative for pelvic pain.  Musculoskeletal: Positive for back pain. Negative for neck pain.  Skin: Negative for color change and wound.  Allergic/Immunologic: Negative for immunocompromised state.  Neurological: Negative for weakness and numbness.  Hematological: Does not bruise/bleed easily.  Psychiatric/Behavioral: Negative for self-injury.   Allergies  Review of patient's allergies indicates no known allergies.  Home Medications   Prior to Admission medications   Medication Sig Start Date End Date Taking? Authorizing Provider  cephALEXin (KEFLEX) 500 MG capsule Take 1 capsule (500 mg total) by mouth 2 (two) times daily. 11/08/14   Mady GemmaElizabeth C Westfall, PA-C  cyclobenzaprine (FLEXERIL) 10 MG tablet Take 1 tablet (10 mg total) by mouth 2 (two) times daily as needed for muscle spasms. 08/12/13   Marissa Sciacca, PA-C  HYDROcodone-acetaminophen (NORCO/VICODIN) 5-325 MG per tablet Take 1-2 tablets by mouth every 6 (six) hours as needed for moderate pain. 04/09/13   Natalia Leatherwoodaniel Woodruff, MD  HYDROcodone-acetaminophen (NORCO/VICODIN) 5-325 MG per tablet Take 1 tablet by mouth every 6 (six) hours as needed for moderate pain or severe pain. 08/12/13   Marissa Sciacca, PA-C  hyoscyamine (LEVSIN SL) 0.125 MG SL tablet Place 1 tablet (0.125 mg total) under the tongue every 4 (four) hours as needed (bladder spasms). 03/07/13   Natalia Leatherwoodaniel Woodruff, MD  ibuprofen (ADVIL,MOTRIN) 800 MG tablet Take 1 tablet (800 mg  total) by mouth 3 (three) times daily. 11/08/14   Mady GemmaElizabeth C Westfall, PA-C  indomethacin (INDOCIN) 25 MG capsule Take 1 capsule (25 mg total) by mouth 3 (three) times daily as needed for mild pain or moderate pain. As needed for pain 03/19/14   Mathis FareJennifer Lee H Presson, PA  senna-docusate (SENOKOT S) 8.6-50 MG per tablet Take 1 tablet by mouth daily. 03/21/13   Natalia Leatherwoodaniel Woodruff, MD   BP 127/72 mmHg  Pulse 76  Temp(Src) 98.1 F (36.7 C) (Oral)  Resp 14  Ht 5\' 8"  (1.727 m)  Wt 160 lb (72.576 kg)  BMI 24.33 kg/m2  SpO2 99%  LMP 10/16/2014 Physical Exam  Constitutional: She appears well-developed and well-nourished. No distress.  HENT:  Head: Normocephalic and atraumatic.  Neck: Neck supple.  Pulmonary/Chest: Effort normal.  Abdominal: Soft. She exhibits no distension and no mass. There is no tenderness. There is no rebound and no guarding.  Musculoskeletal: She exhibits tenderness.  Spine nontender, no crepitus, or stepoffs. Lower extremities:  Strength 5/5, sensation intact, distal pulses intact.    Pain indicated in the area of the gluteal cleft. Pain with pressure in that area.  No other bony tenderness. No crepitus or stepoffs.   Neurological: She is alert.  Skin: She is not diaphoretic.  Nursing note and vitals reviewed.   ED Course  Procedures (including critical care time)  DIAGNOSTIC STUDIES: Oxygen Saturation is 99% on RA, normal by my interpretation.    COORDINATION OF CARE: 8:47 PM - Discussed treatment plan with pt at bedside which includes XRs. Pt verbalized understanding and agreed to plan.   Imaging Review Dg Pelvis 1-2 Views  11/16/2014  CLINICAL DATA:  Larey Seat downstairs on Friday.  Persistent pelvic pain. EXAM: PELVIS - 1-2 VIEW COMPARISON:  None. 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 The pubic symphysis and SI joints are intact. Both hips are normally located. No acute fracture is identified. IMPRESSION: No acute bony findings.  Electronically Signed   By: Rudie Meyer M.D.   On: 11/16/2014 21:42   Dg Sacrum/coccyx  11/16/2014  CLINICAL DATA:  Pt had a fall down several stairs on Friday. Severe pain and difficulty with walking and sitting down. Pt denies any hip pain. Pain is all posterior. EXAM: SACRUM AND COCCYX - 2+ VIEW COMPARISON:  03/31/2013 FINDINGS: Cortical discontinuity and subtle angulation through the lower body of the sacrum below the level of sacroiliac joints, also suspected on the lateral radiograph, suggesting nondisplaced transverse low sacral fracture. No involvement of the arcuate lines nor extension to the sacroiliac joint. Chronic osteitis pubis and left sacroiliitis. IMPRESSION: 1. Possible low transverse sacral fracture, minimally displaced, with no involvement of sacroiliac joints. 2. Chronic osteitis pubis and left sacroiliitis. Electronically Signed   By: Corlis Leak M.D.   On: 11/16/2014 21:47   I have personally reviewed and evaluated these images and lab results as part of my medical decision-making.  10:05 PM Discussed patient, xray findings, treatment plan with Dr Jeraldine Loots.    MDM   Final diagnoses:  Sacral fracture, closed, initial encounter   Afebrile, nontoxic patient with sacral fracture after falling directly on it 4 days ago.  Neurovascularly intact.  Otherwise no apparent injury.  No red flags for back injury.   D/C home with pain medication, orthopedic follow up.  Discussed result, findings, treatment, and follow up  with patient.  Pt given return precautions.  Pt verbalizes understanding and agrees with plan.        I personally performed the services described in this documentation, which was scribed in my presence. The recorded information has been reviewed and is accurate.     Trixie Dredge, PA-C 11/16/14 [phone]  Gerhard Munch, MD 11/17/14 [phone]

## 2014-11-16 NOTE — ED Notes (Signed)
Patient transported to X-ray 

## 2014-11-20 ENCOUNTER — Ambulatory Visit (INDEPENDENT_AMBULATORY_CARE_PROVIDER_SITE_OTHER): Payer: Self-pay | Admitting: Family Medicine

## 2014-11-20 VITALS — BP 135/76 | HR 65 | Temp 98.4°F | Resp 16 | Ht 60.0 in | Wt 159.0 lb

## 2014-11-20 DIAGNOSIS — Z87442 Personal history of urinary calculi: Secondary | ICD-10-CM

## 2014-11-20 DIAGNOSIS — E669 Obesity, unspecified: Secondary | ICD-10-CM

## 2014-11-20 DIAGNOSIS — D72829 Elevated white blood cell count, unspecified: Secondary | ICD-10-CM

## 2014-11-20 DIAGNOSIS — S3992XD Unspecified injury of lower back, subsequent encounter: Secondary | ICD-10-CM

## 2014-11-20 DIAGNOSIS — M533 Sacrococcygeal disorders, not elsewhere classified: Secondary | ICD-10-CM

## 2014-11-20 LAB — LIPID PANEL
CHOL/HDL RATIO: 5.4 ratio — AB (ref <=5.0)
CHOLESTEROL: 174 mg/dL (ref 125–200)
HDL: 32 mg/dL — AB (ref >=46)
LDL Cholesterol: 112 mg/dL (ref <130)
Triglycerides: 148 mg/dL (ref <150)
VLDL: 30 mg/dL (ref <30)

## 2014-11-20 LAB — CBC WITH DIFFERENTIAL/PLATELET
BASOS ABS: 0 10*3/uL (ref 0.0–0.1)
BASOS PCT: 0 % (ref 0–1)
EOS ABS: 0.5 10*3/uL (ref 0.0–0.7)
Eosinophils Relative: 5 % (ref 0–5)
HCT: 41.2 % (ref 36.0–46.0)
Hemoglobin: 13.4 g/dL (ref 12.0–15.0)
LYMPHS ABS: 3.1 10*3/uL (ref 0.7–4.0)
Lymphocytes Relative: 30 % (ref 12–46)
MCH: 26.4 pg (ref 26.0–34.0)
MCHC: 32.5 g/dL (ref 30.0–36.0)
MCV: 81.1 fL (ref 78.0–100.0)
MPV: 9.6 fL (ref 8.6–12.4)
Monocytes Absolute: 0.6 10*3/uL (ref 0.1–1.0)
Monocytes Relative: 6 % (ref 3–12)
NEUTROS PCT: 59 % (ref 43–77)
Neutro Abs: 6.1 10*3/uL (ref 1.7–7.7)
Platelets: 405 10*3/uL — ABNORMAL HIGH (ref 150–400)
RBC: 5.08 MIL/uL (ref 3.87–5.11)
RDW: 14.3 % (ref 11.5–15.5)
WBC: 10.4 10*3/uL (ref 4.0–10.5)

## 2014-11-20 LAB — COMPLETE METABOLIC PANEL WITH GFR
ALBUMIN: 4.4 g/dL (ref 3.6–5.1)
ALK PHOS: 74 U/L (ref 33–115)
ALT: 9 U/L (ref 6–29)
AST: 13 U/L (ref 10–30)
BUN: 14 mg/dL (ref 7–25)
CALCIUM: 9.4 mg/dL (ref 8.6–10.2)
CO2: 24 mmol/L (ref 20–31)
Chloride: 103 mmol/L (ref 98–110)
Creat: 0.66 mg/dL (ref 0.50–1.10)
GFR, Est African American: >89 mL/min (ref >=60)
Glucose, Bld: 75 mg/dL (ref 65–99)
POTASSIUM: 4.5 mmol/L (ref 3.5–5.3)
Sodium: 136 mmol/L (ref 135–146)
TOTAL PROTEIN: 7.2 g/dL (ref 6.1–8.1)
Total Bilirubin: 0.3 mg/dL (ref 0.2–1.2)

## 2014-11-20 LAB — HEMOGLOBIN A1C
Hgb A1c MFr Bld: 5.6 % (ref <5.7)
Mean Plasma Glucose: 114 mg/dL (ref <117)

## 2014-11-20 MED ORDER — KETOROLAC TROMETHAMINE 30 MG/ML IJ SOLN
30.0000 mg | Freq: Once | INTRAMUSCULAR | Status: AC
  Administered 2014-11-20: 30 mg via INTRAMUSCULAR

## 2014-11-20 NOTE — Patient Instructions (Signed)
Back Pain, Adult °Back pain is very common in adults. The cause of back pain is rarely dangerous and the pain often gets better over time. The cause of your back pain may not be known. Some common causes of back pain include: °· Strain of the muscles or ligaments supporting the spine. °· Wear and tear (degeneration) of the spinal disks. °· Arthritis. °· Direct injury to the back. °For many people, back pain may return. Since back pain is rarely dangerous, most people can learn to manage this condition on their own. °HOME CARE INSTRUCTIONS °Watch your back pain for any changes. The following actions may help to lessen any discomfort you are feeling: °· Remain active. It is stressful on your back to sit or stand in one place for long periods of time. Do not sit, drive, or stand in one place for more than 30 minutes at a time. Take short walks on even surfaces as soon as you are able. Try to increase the length of time you walk each day. °· Exercise regularly as directed by your health care provider. Exercise helps your back heal faster. It also helps avoid future injury by keeping your muscles strong and flexible. °· Do not stay in bed. Resting more than 1-2 days can delay your recovery. °· Pay attention to your body when you bend and lift. The most comfortable positions are those that put less stress on your recovering back. Always use proper lifting techniques, including: °· Bending your knees. °· Keeping the load close to your body. °· Avoiding twisting. °· Find a comfortable position to sleep. Use a firm mattress and lie on your side with your knees slightly bent. If you lie on your back, put a pillow under your knees. °· Avoid feeling anxious or stressed. Stress increases muscle tension and can worsen back pain. It is important to recognize when you are anxious or stressed and learn ways to manage it, such as with exercise. °· Take medicines only as directed by your health care provider. Over-the-counter  medicines to reduce pain and inflammation are often the most helpful. Your health care provider may prescribe muscle relaxant drugs. These medicines help dull your pain so you can more quickly return to your normal activities and healthy exercise. °· Apply ice to the injured area: °· Put ice in a plastic bag. °· Place a towel between your skin and the bag. °· Leave the ice on for 20 minutes, 2-3 times a day for the first 2-3 days. After that, ice and heat may be alternated to reduce pain and spasms. °· Maintain a healthy weight. Excess weight puts extra stress on your back and makes it difficult to maintain good posture. °SEEK MEDICAL CARE IF: °· You have pain that is not relieved with rest or medicine. °· You have increasing pain going down into the legs or buttocks. °· You have pain that does not improve in one week. °· You have night pain. °· You lose weight. °· You have a fever or chills. °SEEK IMMEDIATE MEDICAL CARE IF:  °· You develop new bowel or bladder control problems. °· You have unusual weakness or numbness in your arms or legs. °· You develop nausea or vomiting. °· You develop abdominal pain. °· You feel faint. °  °This information is not intended to replace advice given to you by your health care provider. Make sure you discuss any questions you have with your health care provider. °  °Document Released: 12/19/2004 Document Revised: 01/09/2014 Document Reviewed: 04/22/2013 °Elsevier Interactive Patient Education ©2016 Elsevier   Inc. Dietary Guidelines to Help Prevent Kidney Stones Your risk of kidney stones can be decreased by adjusting the foods you eat. The most important thing you can do is drink enough fluid. You should drink enough fluid to keep your urine clear or pale yellow. The following guidelines provide specific information for the type of kidney stone you have had. GUIDELINES ACCORDING TO TYPE OF KIDNEY STONE Calcium Oxalate Kidney Stones  Reduce the amount of salt you eat. Foods that  have a lot of salt cause your body to release excess calcium into your urine. The excess calcium can combine with a substance called oxalate to form kidney stones.  Reduce the amount of animal protein you eat if the amount you eat is excessive. Animal protein causes your body to release excess calcium into your urine. Ask your dietitian how much protein from animal sources you should be eating.  Avoid foods that are high in oxalates. If you take vitamins, they should have less than 500 mg of vitamin C. Your body turns vitamin C into oxalates. You do not need to avoid fruits and vegetables high in vitamin C. Calcium Phosphate Kidney Stones  Reduce the amount of salt you eat to help prevent the release of excess calcium into your urine.  Reduce the amount of animal protein you eat if the amount you eat is excessive. Animal protein causes your body to release excess calcium into your urine. Ask your dietitian how much protein from animal sources you should be eating.  Get enough calcium from food or take a calcium supplement (ask your dietitian for recommendations). Food sources of calcium that do not increase your risk of kidney stones include:  Broccoli.  Dairy products, such as cheese and yogurt.  Pudding. Uric Acid Kidney Stones  Do not have more than 6 oz of animal protein per day. FOOD SOURCES Animal Protein Sources  Meat (all types).  Poultry.  Eggs.  Fish, seafood. Foods High in MirantSalt  Salt seasonings.  Soy sauce.  Teriyaki sauce.  Cured and processed meats.  Salted crackers and snack foods.  Fast food.  Canned soups and most canned foods. Foods High in Oxalates  Grains:  Amaranth.  Barley.  Grits.  Wheat germ.  Bran.  Buckwheat flour.  All bran cereals.  Pretzels.  Whole wheat bread.  Vegetables:  Beans (wax).  Beets and beet greens.  Collard  greens.  Eggplant.  Escarole.  Leeks.  Okra.  Parsley.  Rutabagas.  Spinach.  Swiss chard.  Tomato paste.  Fried potatoes.  Sweet potatoes.  Fruits:  Red currants.  Figs.  Kiwi.  Rhubarb.  Meat and Other Protein Sources:  Beans (dried).  Soy burgers and other soybean products.  Miso.  Nuts (peanuts, almonds, pecans, cashews, hazelnuts).  Nut butters.  Sesame seeds and tahini (paste made of sesame seeds).  Poppy seeds.  Beverages:  Chocolate drink mixes.  Soy milk.  Instant iced tea.  Juices made from high-oxalate fruits or vegetables.  Other:  Carob.  Chocolate.  Fruitcake.  Marmalades.   This information is not intended to replace advice given to you by your health care provider. Make sure you discuss any questions you have with your health care provider.   Document Released: 04/15/2010 Document Revised: 12/24/2012 Document Reviewed: 11/15/2012 Elsevier Interactive Patient Education [employer]2016 Elsevier Inc.

## 2014-11-20 NOTE — Progress Notes (Signed)
Subjective:    Patient ID: [name], female    DOB: [birth year], 39 y.o.   MRN: [id number]  HPI  Ms. [name] is a 39 year old female that presents to establish care. She states that she has not had a primary provider due to insurance/financial constraints. She report that she has had worsening back pain over the past several weeks. She maintains that she sustained a back injury on 11/13/2014, she slipped on the carpet and fell down the stairs hitting every step with her low back. She was evaluated in the emergency department on 11/16/2014. She states that pain is excrutiating. She states that pain is worsened by increased activity, weight bearing, sitting, and transitioning from sitting to standing. She denies dizziness, roin pain, fever, abdominal pain, bowel or bladder incontinence. She was prescribed Hydrocodone-acetaminophen, she has not gotten the Rx filled and is taking Ibuprofen with minimal relief.   Patient reports a history of frequent kidney stones and urinary tract infections. She states that she was treated on 11/08/2014 and is completing Keflex. She states that she has been treated at the emergency department for symptoms. She current denies urinary frequency, dysuria, fever, chills, or vaginal discharge.  Past Medical History  Diagnosis Date  . Eczema   . Yeast infection     current- 03/25/13- has notified Dr Margarita Grizzle. per patient --  PT STATES 04-01-2013  IMPROVING BUT STILL IRRITATED  . Right nephrolithiasis   . Right wrist tendonitis   . History of kidney stones   . H/O nephrolithotomy with removal of calculi     RIGHT FLANK OPEN INCISION AREA--  CHANGE DRESSING DAILY  There is no immunization history for the selected administration types on file for this patient.  No Known Allergies  Social History   Social History  . Marital Status: Single    Spouse Name: N/A  . Number of Children: N/A  . Years of Education: N/A   Occupational History  . Not on file.    Social History Main Topics  . Smoking status: Former Smoker -- 0.25 packs/day for 0 years    Types: Cigarettes    Quit date: 03/11/2013  . Smokeless tobacco: Never Used     Comment: - plans to quit  . Alcohol Use: Yes  . Drug Use: No  . Sexual Activity: No   Other Topics Concern  . Not on file   Social History Narrative   Review of Systems  Constitutional: Negative for fever and fatigue.  HENT: Negative.   Respiratory: Negative.  Negative for shortness of breath and wheezing.   Cardiovascular: Negative for chest pain, palpitations and leg swelling.  Gastrointestinal: Negative for diarrhea and constipation.  Endocrine: Negative.  Negative for polydipsia, polyphagia and polyuria.  Genitourinary: Positive for dysuria. Negative for flank pain and decreased urine volume.  Musculoskeletal: Positive for back pain.  Skin: Negative.   Allergic/Immunologic: Negative.   Neurological: Negative.  Negative for dizziness and numbness.  Hematological: Negative.   Psychiatric/Behavioral: Negative.  Negative for suicidal ideas and sleep disturbance.       Objective:   Physical Exam  Constitutional: She is oriented to person, place, and time. She appears well-developed and well-nourished.  HENT:  Head: Normocephalic and atraumatic.  Right Ear: External ear normal.  Left Ear: External ear normal.  Mouth/Throat: Oropharynx is clear and moist.  Eyes: Conjunctivae and EOM are normal. Pupils are equal, round, and reactive to light.  Neck: Normal range of motion. Neck supple.  Pulmonary/Chest:  Effort normal and breath sounds normal.  Abdominal: Soft. Bowel sounds are normal.  Musculoskeletal:       Lumbar back: She exhibits decreased range of motion, pain and spasm. She exhibits no swelling.  Neurological: She is alert and oriented to person, place, and time. She has normal reflexes.  Skin: Skin is warm and dry.  Psychiatric: She has a normal mood and affect. Her behavior is normal.  Judgment and thought content normal.      BP 135/76 mmHg  Pulse 65  Temp(Src) 98.4 F (36.9 C) (Oral)  Resp 16  Ht 5' (1.524 m)  Wt 159 lb (72.122 kg)  BMI 31.05 kg/m2  LMP 11/19/2014 Assessment & Plan:   1. Sacral back pain Reviewed xrays from 11/16/2014. X-rays show possible low transverse sacral fracture, minimally displaced, with no involvement of sacroiliac joints and chronic osteitis pubis and left sacroiliitis. Recommend that patient continues Ibuprofen 800 mg three times per day as needed. Continue warm, moist compresses to lower back as needed. Will send a referral to sports medicine for further evaluation.    - ketorolac (TORADOL) 30 MG/ML injection 30 mg; Inject 1 mL (30 mg total) into the muscle once. - Ambulatory referral to Sports Medicine  2. Lower back injury, subsequent encounter  - Ambulatory referral to Sports Medicine  3. Obesity Recommend a lowfat, low carbohydrate diet divided over 5-6 small meals, increase water intake to 6-8 glasses, and 150 minutes per week of cardiovascular exercise.   - Hemoglobin A1c - TSH - Lipid Panel  4. History of kidney stones Patient is currently having her menstrual cycle, will not repeat urinalysis on today.  - COMPLETE METABOLIC PANEL WITH GFR  5. Leukocytosis Previous WBC count is 12.5, will repeat today.  - CBC with Differential   Routine preventative maintenance: Schedule pap smear Recommend yearly dental and eye examinations Recommend monthly self-breast examinations after menstrual cycle  The patient was given clear instructions to go to ER or return to medical center if symptoms do not improve, worsen or new problems develop. The patient verbalized understanding. Will notify patient with laboratory results.  Massie MaroonHollis,Albi Rappaport M, FNP

## 2014-11-21 LAB — TSH: TSH: 0.671 u[IU]/mL (ref 0.350–4.500)

## 2014-11-23 ENCOUNTER — Encounter: Payer: Self-pay | Admitting: Family Medicine

## 2014-11-23 ENCOUNTER — Telehealth: Payer: Self-pay

## 2014-11-23 DIAGNOSIS — S3992XA Unspecified injury of lower back, initial encounter: Secondary | ICD-10-CM | POA: Insufficient documentation

## 2014-11-23 NOTE — Telephone Encounter (Signed)
-----   Message from Massie MaroonLachina M Hollis, [place] sent at 11/21/2014  9:42 AM EST ----- Regarding: lab results Please inform patient that labs are within a normal limit. Recommend a lowfat, low carbohydrate diet divided over 5-6 small meals, increase water intake to 6-8 glasses, and 150 minutes per week of cardiovascular exercise to decrease current BMI.   Will send referral to orthopedic physician for previous back injury.   Thanks  ----- Message -----    From: [facility] in [facility] Interface    Sent: 11/21/2014  12:44 AM      To: Massie MaroonLachina M Hollis, FNP

## 2014-11-23 NOTE — Telephone Encounter (Signed)
Left message for patient advised of normal labs and all of china's instructions. Advised patient if any questions to call back and left our number. Thanks!

## 2014-12-01 ENCOUNTER — Telehealth: Payer: Self-pay | Admitting: Hematology

## 2014-12-01 NOTE — Addendum Note (Signed)
Addended by: Kerry FortATUM, Yanis Larin L on: 12/01/2014 10:34 AM   Modules accepted: Medications

## 2014-12-01 NOTE — Telephone Encounter (Signed)
Opened in ERROR

## 2015-01-26 ENCOUNTER — Encounter (HOSPITAL_COMMUNITY): Payer: Self-pay

## 2015-01-26 ENCOUNTER — Emergency Department (HOSPITAL_COMMUNITY)
Admission: EM | Admit: 2015-01-26 | Discharge: 2015-01-26 | Disposition: A | Discharge status: Home / Self Care | Payer: Self-pay | Attending: Emergency Medicine | Admitting: Emergency Medicine

## 2015-01-26 ENCOUNTER — Emergency Department (HOSPITAL_COMMUNITY): Payer: Self-pay

## 2015-01-26 DIAGNOSIS — Z9851 Tubal ligation status: Secondary | ICD-10-CM | POA: Insufficient documentation

## 2015-01-26 DIAGNOSIS — Z87442 Personal history of urinary calculi: Secondary | ICD-10-CM | POA: Insufficient documentation

## 2015-01-26 DIAGNOSIS — Z3202 Encounter for pregnancy test, result negative: Secondary | ICD-10-CM | POA: Insufficient documentation

## 2015-01-26 DIAGNOSIS — Z8739 Personal history of other diseases of the musculoskeletal system and connective tissue: Secondary | ICD-10-CM | POA: Insufficient documentation

## 2015-01-26 DIAGNOSIS — Z792 Long term (current) use of antibiotics: Secondary | ICD-10-CM | POA: Insufficient documentation

## 2015-01-26 DIAGNOSIS — Z872 Personal history of diseases of the skin and subcutaneous tissue: Secondary | ICD-10-CM | POA: Insufficient documentation

## 2015-01-26 DIAGNOSIS — Z9889 Other specified postprocedural states: Secondary | ICD-10-CM | POA: Insufficient documentation

## 2015-01-26 DIAGNOSIS — R109 Unspecified abdominal pain: Secondary | ICD-10-CM

## 2015-01-26 DIAGNOSIS — F17210 Nicotine dependence, cigarettes, uncomplicated: Secondary | ICD-10-CM | POA: Insufficient documentation

## 2015-01-26 DIAGNOSIS — R350 Frequency of micturition: Secondary | ICD-10-CM | POA: Insufficient documentation

## 2015-01-26 DIAGNOSIS — Z8619 Personal history of other infectious and parasitic diseases: Secondary | ICD-10-CM | POA: Insufficient documentation

## 2015-01-26 LAB — URINE MICROSCOPIC-ADD ON: RBC / HPF: NONE SEEN RBC/hpf (ref 0–5)

## 2015-01-26 LAB — URINALYSIS, ROUTINE W REFLEX MICROSCOPIC
Bilirubin Urine: NEGATIVE
Glucose, UA: NEGATIVE mg/dL
HGB URINE DIPSTICK: NEGATIVE
KETONES UR: NEGATIVE mg/dL
Nitrite: NEGATIVE
Protein, ur: NEGATIVE mg/dL
Specific Gravity, Urine: 1.016 (ref 1.005–1.030)
pH: 7 (ref 5.0–8.0)

## 2015-01-26 LAB — I-STAT BETA HCG BLOOD, ED (MC, WL, AP ONLY)

## 2015-01-26 MED ORDER — HYDROMORPHONE HCL 1 MG/ML IJ SOLN
0.5000 mg | INTRAMUSCULAR | Status: DC | PRN
  Administered 2015-01-26: 0.5 mg via INTRAVENOUS
  Filled 2015-01-26: qty 1

## 2015-01-26 MED ORDER — SODIUM CHLORIDE 0.9 % IV BOLUS (SEPSIS)
1000.0000 mL | Freq: Once | INTRAVENOUS | Status: AC
  Administered 2015-01-26: 1000 mL via INTRAVENOUS

## 2015-01-26 MED ORDER — OXYCODONE-ACETAMINOPHEN 5-325 MG PO TABS: 1.0000 | ORAL_TABLET | Freq: Four times a day (QID) | ORAL | Status: DC | PRN

## 2015-01-26 MED ORDER — NAPROXEN 500 MG PO TABS: 500.0000 mg | ORAL_TABLET | Freq: Two times a day (BID) | ORAL | Status: DC

## 2015-01-26 NOTE — Discharge Instructions (Signed)
Call the urology clinic and schedule an appointment for follow up.  Naproxen as directed for pain and inflammation Percocet AS NEEDED for pain - This can make you very drowsy - please do not drink or drive on this medication Follow up with primary doctor in the next week.  Return to ER for any new or worsening conditions, any additional concerns.

## 2015-01-26 NOTE — ED Notes (Signed)
Pt reports was seen Nov 2016 and dx with kidney stones.  Right flank pain worsening.  Pt did not have funds to f/u with urology.

## 2015-01-26 NOTE — ED Provider Notes (Signed)
CSN: [id number]     Arrival date & time 01/26/15  1510 History  By signing my name below, I, Tanda Rockers, attest that this documentation has been prepared under the direction and in the presence of [facility], PA-C.  Electronically Signed: Tanda Rockers, ED Scribe. 01/26/2015. 4:45 PM.   Chief Complaint  Patient presents with  . Flank Pain   The history is provided by the patient. No language interpreter was used.    HPI Comments: [name] is a 40 y.o. female who presents to the Emergency Department complaining of gradual onset, constant, gradually worsening, right flank pain x 3 months. The pain began radiating to right side this past week. She also complains of urinary frequency. Pt was seen in Nov. 2016 (approximately 3 months ago) for hematuria and was diagnosed with a UTI and a possible kidney stone. She was told to follow up with Urology but could not due to monetary issues. Pt was placed on antibiotics at that time which improved her hematuria but had no relief on the flank pain. Pt has hx of recurrent kidney stones which required nephrolithotomy 2 years ago.  Denies abdominal pain, chest pain, or any other associated symptoms.    Past Medical History  Diagnosis Date  . Eczema   . Yeast infection     current- 03/25/13- has notified Dr Margarita Grizzle. per patient --  PT STATES 04-01-2013  IMPROVING BUT STILL IRRITATED  . Right nephrolithiasis   . Right wrist tendonitis   . History of kidney stones   . H/O nephrolithotomy with removal of calculi     RIGHT FLANK OPEN INCISION AREA--  CHANGE DRESSING DAILY   Past Surgical History  Procedure Laterality Date  . Ectopic pregnancy surgery  2005  . Cesarean section with bilateral tubal ligation Right 10/18/2012    Procedure: REPEAT CESAREAN SECTION WITH BILATERAL TUBAL LIGATION;  Surgeon: Oliver Pila, MD;  Location: [facility];  Service: Obstetrics;  Laterality: Right; TOTAL OF 4 C SECTIONS  . Nephrolithotomy Right 03/03/2013     Procedure: Cystoscopy, Right Ureteroscopy with holmium laser With Strent;  Surgeon: Milford Cage, MD;  Location: [facility];  Service: Urology;  Laterality: Right;  . Holmium laser application Right 03/03/2013    Procedure: HOLMIUM LASER APPLICATION;  Surgeon: Milford Cage, MD;  Location: [facility];  Service: Urology;  Laterality: Right;  . Nephrolithotomy Right 03/18/2013    Procedure: RIGHT NEPHROLITHOTOMY PERCUTANEOUS   (1 STAGE) WITH HOLMIUM LASER;  Surgeon: Milford Cage, MD;  Location: [facility];  Service: Urology;  Laterality: Right;  . Holmium laser application Right 03/18/2013    Procedure: HOLMIUM LASER APPLICATION;  Surgeon: Milford Cage, MD;  Location: [facility];  Service: Urology;  Laterality: Right;  . Cystoscopy/retrograde/ureteroscopy Right 03/27/2013    Procedure: CYSTOSCOPY RIGHT URETEROSCOPY LASER LITHOTRIPSY  RIGHT RETROGRADE PYELOGRAM STENT PLACEMENT ON RIGHT;  Surgeon: Milford Cage, MD;  Location: [facility];  Service: Urology;  Laterality: Right;  . Holmium laser application Right 03/27/2013    Procedure: HOLMIUM LASER APPLICATION;  Surgeon: Milford Cage, MD;  Location: [facility];  Service: Urology;  Laterality: Right;  . Cystoscopy with retrograde pyelogram, ureteroscopy and stent placement Right 04/09/2013    Procedure: CYSTOSCOPY WITH STAGED URETEROSCOPY AND STENT EXCHANGE;  Surgeon: Milford Cage, MD;  Location: [facility];  Service: Urology;  Laterality: Right;  . Holmium laser application Right 04/09/2013    Procedure: HOLMIUM LASER APPLICATION;  Surgeon: Milford Cage,  MD;  Location: [facility];  Service: Urology;  Laterality: Right;   Family History  Problem Relation Age of Onset  . Diabetes type II Mother   . Hypertension Mother   . Diabetes type II Sister    Social History  Substance Use Topics  . Smoking status: Current Some Day Smoker -- 0.25 packs/day for 0 years    Types: Cigarettes     Last Attempt to Quit: 03/11/2013  . Smokeless tobacco: Never Used     Comment: - plans to quit  . Alcohol Use: Yes     Comment: wine occ    OB History    Gravida Para Term Preterm AB TAB SAB Ectopic Multiple Living   0 1 0 0 1  4     Review of Systems  Constitutional: Negative for fever.  HENT: Negative for congestion.   Eyes: Negative for visual disturbance.  Respiratory: Negative for shortness of breath.   Cardiovascular: Negative for chest pain.  Gastrointestinal: Negative for abdominal pain.  Genitourinary: Positive for frequency and flank pain. Negative for hematuria.  Allergic/Immunologic: Negative for immunocompromised state.  Neurological: Negative for headaches.  Hematological: Does not bruise/bleed easily.   Allergies  Review of patient's allergies indicates no known allergies.  Home Medications   Prior to Admission medications   Medication Sig Start Date End Date Taking? Authorizing Provider  ibuprofen (ADVIL,MOTRIN) 200 MG tablet Take 800 mg by mouth every 6 (six) hours as needed for mild pain.   Yes Historical Provider, MD  naproxen (NAPROSYN) 500 MG tablet Take 1 tablet (500 mg total) by mouth 2 (two) times daily. 01/26/15   Chase Picket Ward, PA-C  oxyCODONE-acetaminophen (PERCOCET/ROXICET) 5-325 MG tablet Take 1 tablet by mouth every 6 (six) hours as needed for severe pain. 01/26/15   Jaime Pilcher Ward, PA-C   BP 139/76 mmHg  Pulse 66  Temp(Src) 98.3 F (36.8 C) (Oral)  Resp 16  Ht 5' (1.524 m)  Wt 73.392 kg  BMI 31.60 kg/m2  SpO2 100%  LMP 01/19/2015   Physical Exam  Constitutional: She is oriented to person, place, and time. She appears well-developed and well-nourished. No distress.  HENT:  Head: Normocephalic and atraumatic.  Neck: Normal range of motion. Neck supple. No tracheal deviation present.  Cardiovascular: Normal rate and regular rhythm.   Pulmonary/Chest: Effort normal and breath sounds normal. No respiratory distress. She has no  wheezes. She has no rales.  Abdominal: Soft. Bowel sounds are normal. There is no tenderness. There is no CVA tenderness.  Right flank tenderness  Musculoskeletal: Normal range of motion.  Neurological: She is alert and oriented to person, place, and time.  Skin: Skin is warm and dry.  Psychiatric: She has a normal mood and affect. Her behavior is normal.  Nursing note and vitals reviewed.   ED Course  Procedures (including critical care time)  DIAGNOSTIC STUDIES: Oxygen Saturation is 98% on RA, normal by my interpretation.    COORDINATION OF CARE: 4:44 PM-Discussed treatment plan which includes CT renal stone study and UA with pt at bedside and pt agreed to plan.   Labs Review Labs Reviewed  URINALYSIS, ROUTINE W REFLEX MICROSCOPIC (NOT AT [facility]) - Abnormal; Notable for the following:    APPearance HAZY (*)    Leukocytes, UA TRACE (*)    All other components within normal limits  URINE MICROSCOPIC-ADD ON - Abnormal; Notable for the following:    Squamous Epithelial / LPF 0-5 (*)  Bacteria, UA RARE (*)    All other components within normal limits  I-STAT BETA HCG BLOOD, ED ([facility], [facility], [facility] ONLY)  I-STAT BETA HCG BLOOD, ED ([facility], [facility], [facility] ONLY)    Imaging Review Ct Renal Stone Study  01/26/2015  CLINICAL DATA:  Right flank pain history of kidney stones EXAM: CT ABDOMEN AND PELVIS WITHOUT CONTRAST TECHNIQUE: Multidetector CT imaging of the abdomen and pelvis was performed following the standard protocol without IV contrast. COMPARISON:  03/04/2013 in FINDINGS: Lower chest: No acute findings. Tiny right infrahilar/ mediastinal calcifications stable and likely not of acute clinical consequence. Hepatobiliary: Numerous small noncalcified gallstones. No pericholecystic inflammation. Pancreas: Normal Spleen: Several splenules.  No acute findings. Adrenals/Urinary Tract: Adrenal glands are normal. 1 cm low-attenuation lesion upper pole left kidney stable. 1 mm nonobstructing stone lower pole left  kidney. On the right, there are numerous parenchymal calcifications in this patient with extensive prior 05/09/1999 and prior staghorn calculus. S right ureter is mildly dilated but there are no stones seen with in the right renal pelvis or right ureter. There are no calculi identified. Bladder appears normal. There is mild perinephric scarring posteriorly and anteriorly on the right. Stomach/Bowel: Stomach small bowel and appendix are normal. No significant abnormalities involving large bowel. Vascular/Lymphatic: No acute findings Reproductive: Anteflexed uterus with no significant abnormalities Other: No ascites Musculoskeletal: No acute findings IMPRESSION: 1. Abundant cholelithiasis without evidence of pericholecystic inflammation 2.Mild right hydronephrosis and hydroureter without evidence of stone in right ureter or bladder, possibly representing chronic dilitation in this patient with prior right staghorn calculus. There is mild perinephric scarring from prior inflammation / surgery, and there are right renal calcifications that likely represent a combination of parenchymal calcification from scarring and small nonobstructing renal stones. Electronically Signed   By: Esperanza Heir M.D.   On: 01/26/2015 18:47   I have personally reviewed and evaluated these images and lab results as part of my medical decision-making.   EKG Interpretation None      MDM   Final diagnoses:  Flank pain   [name] presents with constant right flank pain x 3 months. Chart reviewed from recent visits. Renal US reviewed.   Labs: UA with trace leuks, 0-5 WBC, rare bacteria. Imaging: CT reviewed with Dr. Donnald Garre. Mild hydronephrosis and hydroureter - no evidence of stone, likely chronic changes from prior stones. Scarring from prior inflammation and surgery present.   Therapeutics: IVF, pain control in ED  A&P: Flank pain likely 2/2 chronic changes. Will treat pain and encourage follow up urology. Vitals  reviewed prior to discharge. Patient in good and stable condition for discharge to home.   Patient discussed with Dr. Donnald Garre who agrees with treatment plan.  I personally performed the services described in this documentation, which was scribed in my presence. The recorded information has been reviewed and is accurate.  Chase Picket Ward, PA-C 01/26/15 1900  [facility] Ward, PA-C 01/26/15 1938  Arby Barrette, MD 01/27/15 [id number]

## 2015-02-22 ENCOUNTER — Ambulatory Visit: Payer: Self-pay | Admitting: Family Medicine

## 2015-05-05 IMAGING — XA IR PERC INTRO URET CATH*R*
1 series · 4 of 4 positions shown · non-contrast
Comparison: none

CLINICAL DATA: Right nephrolithiasis

[Series 300: tube placements · 4 of 4 slices shown]
[im 1/4]
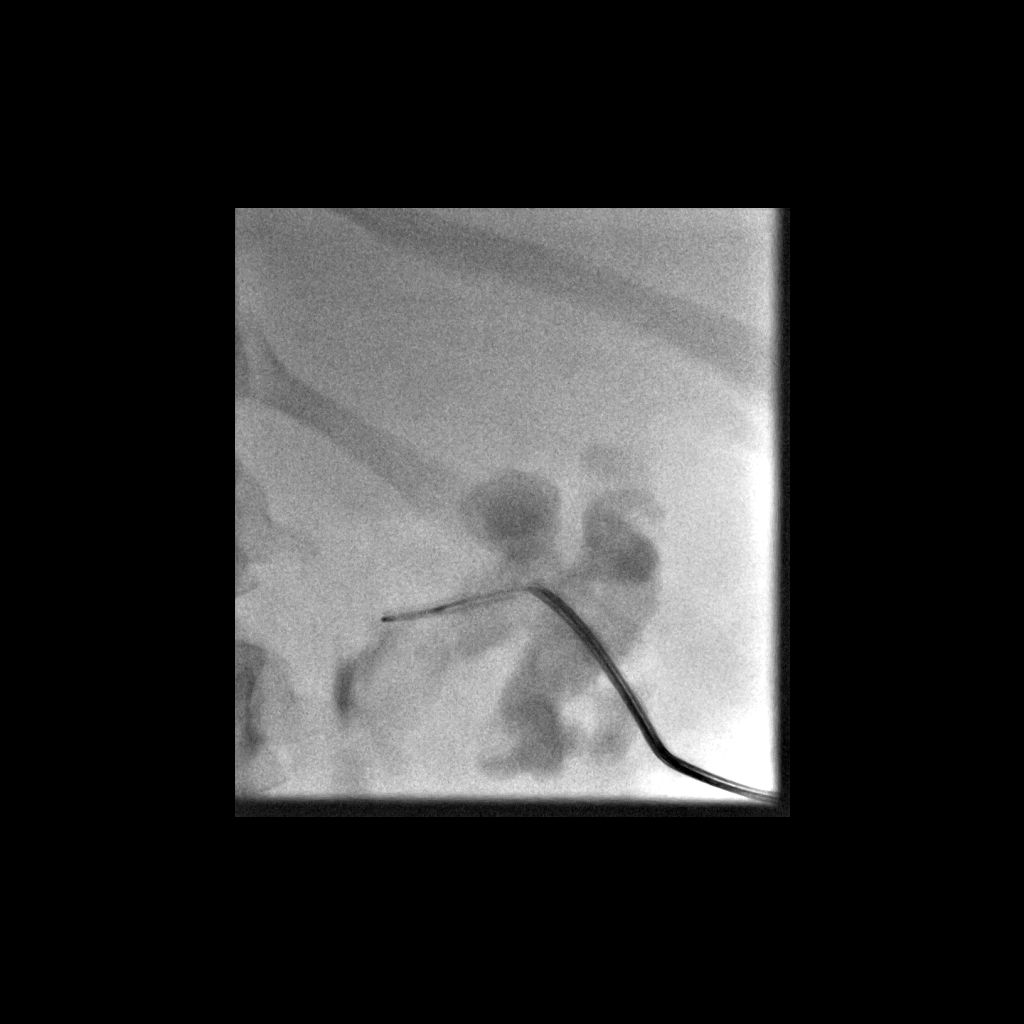
[im 2/4]
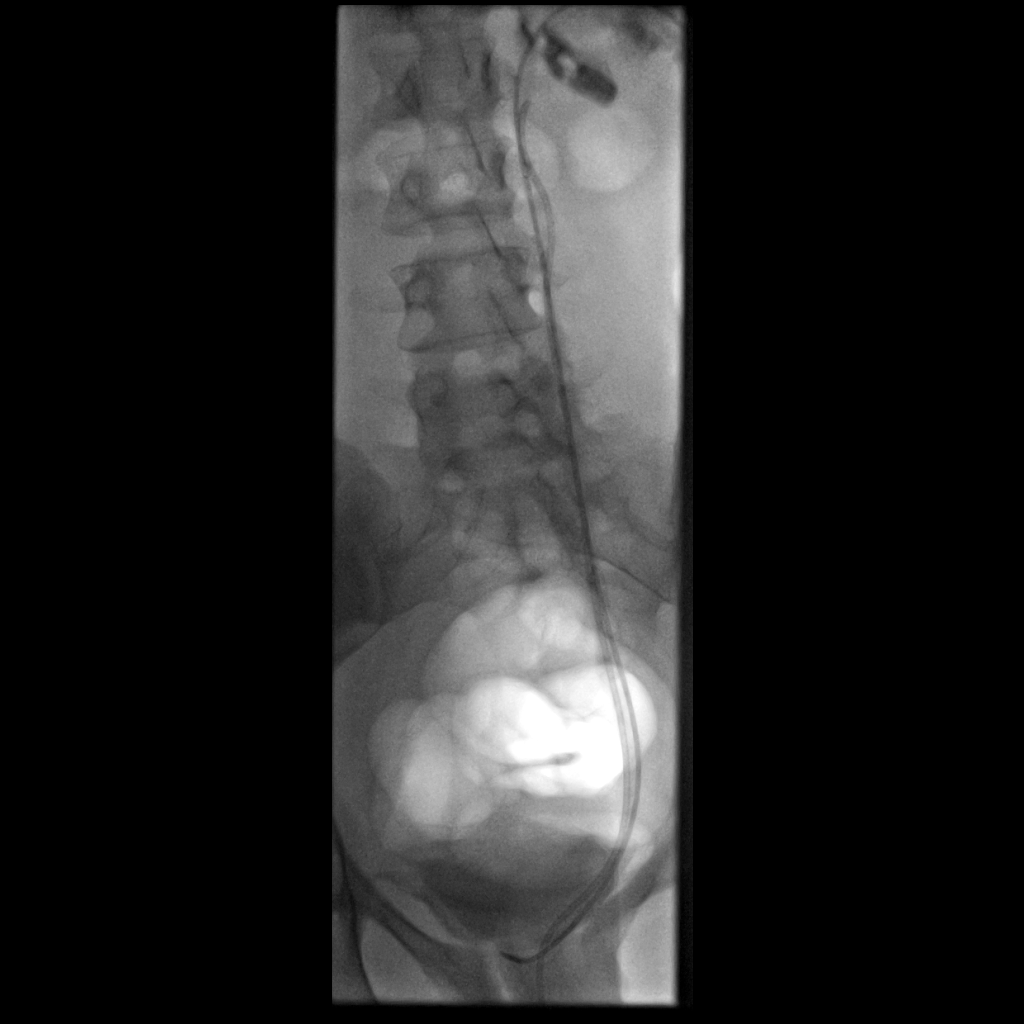
[im 3/4]
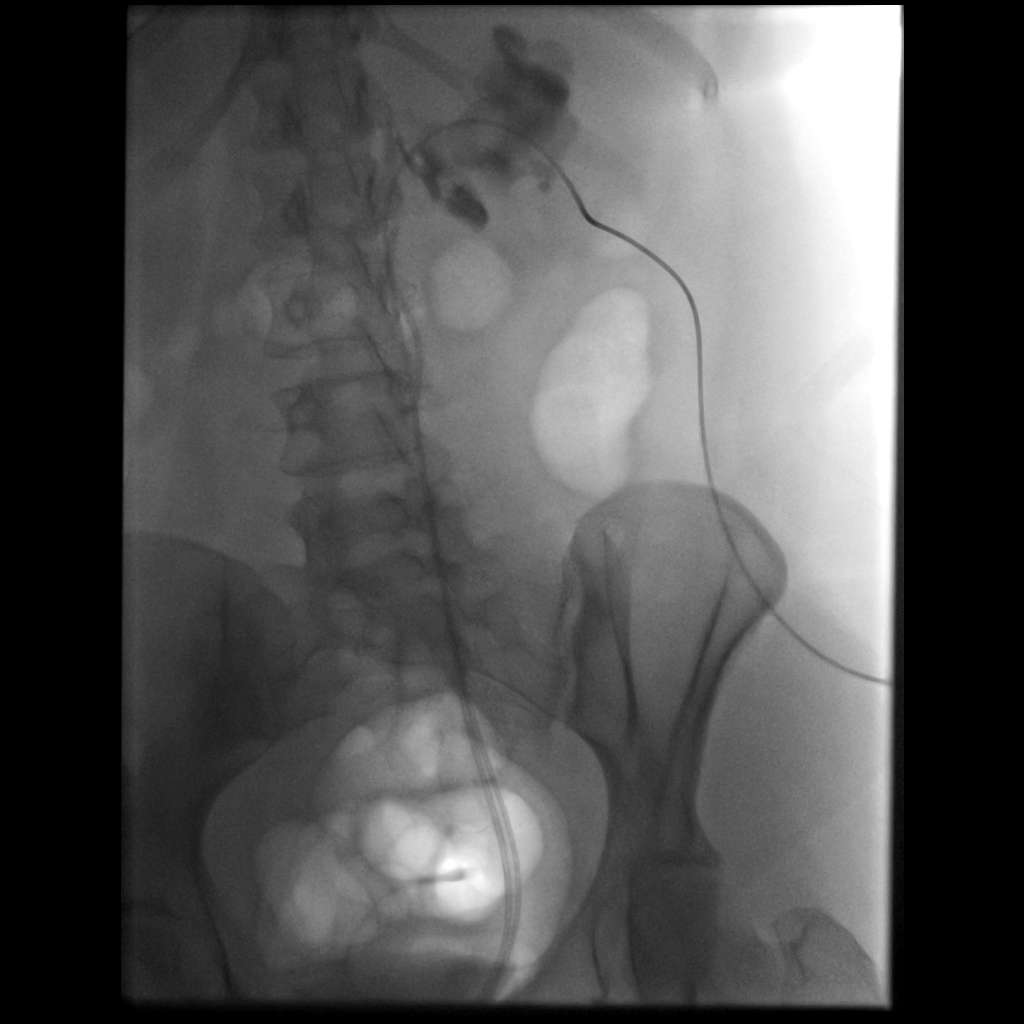
[im 4/4]
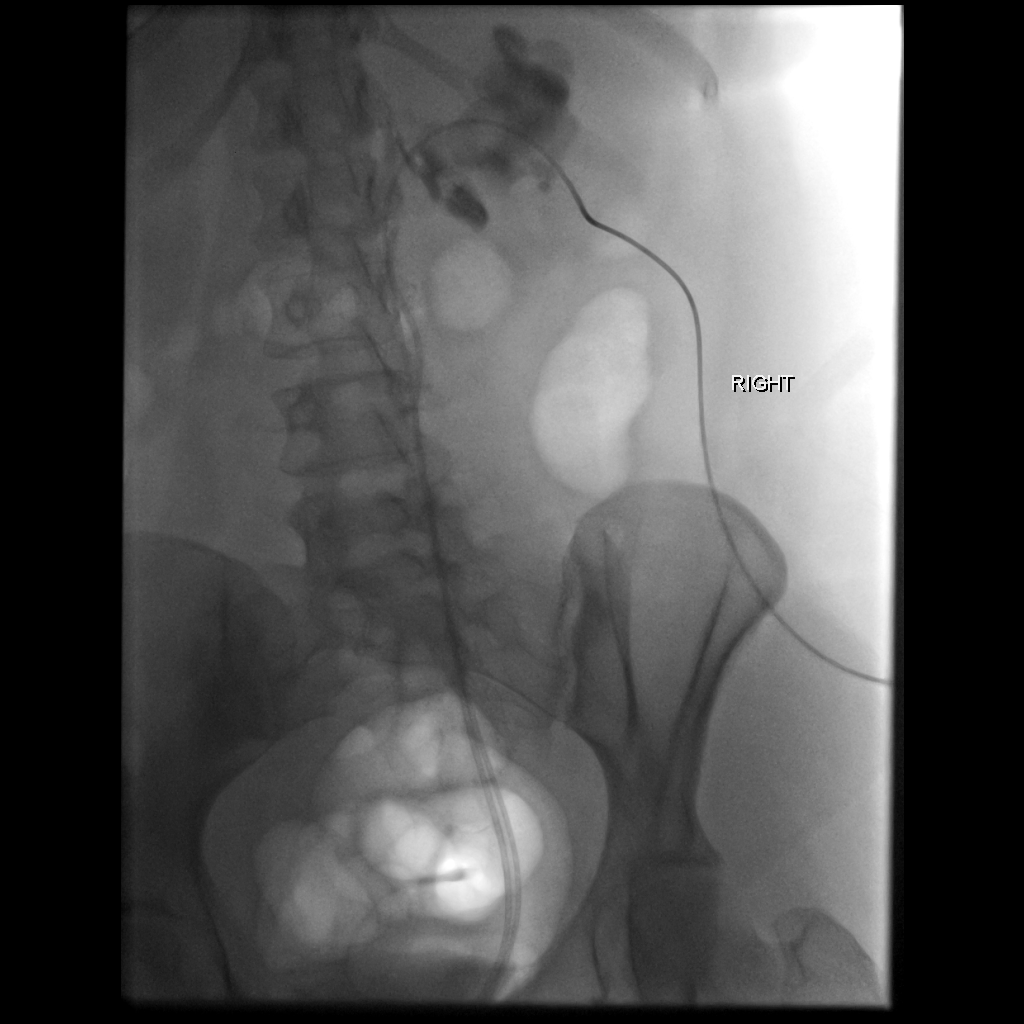

[4 of 4 positions shown; findings below may reference images not displayed]

EXAM:
IR RIGHT PERC INTRO URET CATH; RIGHT NEPHROSTOGRAM

MEDICATIONS AND MEDICAL HISTORY:
Versed 6 mg, Fentanyl 2 under mcg.

ANESTHESIA/SEDATION:
Moderate sedation time: 45 minutes

CONTRAST:  30 cc Omnipaque 300

FLUOROSCOPY TIME:  17 min and 54 seconds.

PROCEDURE:
The procedure, risks, benefits, and alternatives were explained to
the patient. Questions regarding the procedure were encouraged and
answered. The patient understands and consents to the procedure.

The back was prepped with Betadine in a sterile fashion, and a
sterile drape was applied covering the operative field. A sterile
gown and sterile gloves were used for the procedure.

Under fluoroscopic guidance, a 22 gauge Chiba needle was advanced
into a posterior lower pole calices containing calculi. Contrast was
injected for the nephrostogram. The needle was removed over a 018
wire which was up sized to Bambolini Mungiguerra. [place] 4 French glide catheter was
advanced through the APRIMA outer sheath. The 4 French catheter was
advanced over the glidewire to the proximal ureter. It was then
removed over a Bentson wire. A 5 French copy was then advanced over
the Bentson wire to the bladder. Contrast was injected. It was sewn
in place.

COMPLICATIONS:
None
FINDINGS: Images demonstrate extensive calculus burden within the right
kidney. Contrast injection demonstrates patency of the ureter. The
final image demonstrates a nephro ureteral catheter extending to the
bladder via posterior lower pole calyx.
IMPRESSION: Successful nephro ureteral catheter placement for access via the
posterior lower pole calyx.

## 2015-06-14 ENCOUNTER — Ambulatory Visit (HOSPITAL_COMMUNITY)
Admission: EM | Admit: 2015-06-14 | Discharge: 2015-06-14 | Disposition: A | Discharge status: Home / Self Care | Payer: Self-pay | Attending: Family Medicine | Admitting: Family Medicine

## 2015-06-14 ENCOUNTER — Encounter (HOSPITAL_COMMUNITY): Payer: Self-pay | Admitting: Emergency Medicine

## 2015-06-14 DIAGNOSIS — L259 Unspecified contact dermatitis, unspecified cause: Secondary | ICD-10-CM

## 2015-06-14 MED ORDER — PREDNISONE 10 MG PO TABS: 20.0000 mg | ORAL_TABLET | Freq: Every day | ORAL | Status: DC

## 2015-06-14 NOTE — ED Notes (Signed)
Patient has complaints of rash to neck.  Visible bumps and redness.  Patient reports itching

## 2015-06-14 NOTE — Discharge Instructions (Signed)

## 2015-06-15 NOTE — ED Provider Notes (Signed)
CSN: [id number]     Arrival date & time 06/14/15  1951 History   First MD Initiated Contact with Patient 06/14/15 2052     Chief Complaint  Patient presents with  . Rash   (Consider location/radiation/quality/duration/timing/severity/associated sxs/prior Treatment) HPI History obtained from patient: Location: Front of neck  [place]Duration[place]  Severity: 0  Quality:itchy Timing:           constant Home Treatment: washed with soap and water Associated symptoms:  none Family History:HTN    Past Medical History  Diagnosis Date  . Eczema   . Yeast infection     current- 03/25/13- has notified Dr Margarita GrizzleWoodruff. per patient --  PT STATES 04-01-2013  IMPROVING BUT STILL IRRITATED  . Right nephrolithiasis   . Right wrist tendonitis   . History of kidney stones   . H/O nephrolithotomy with removal of calculi     RIGHT FLANK OPEN INCISION AREA--  CHANGE DRESSING DAILY   Past Surgical History  Procedure Laterality Date  . Ectopic pregnancy surgery  2005  . Cesarean section with bilateral tubal ligation Right 10/18/2012    Procedure: REPEAT CESAREAN SECTION WITH BILATERAL TUBAL LIGATION;  Surgeon: Oliver PilaKathy W Richardson, MD;  Location: [facility];  Service: Obstetrics;  Laterality: Right; TOTAL OF 4 C SECTIONS  . Nephrolithotomy Right 03/03/2013    Procedure: Cystoscopy, Right Ureteroscopy with holmium laser With Strent;  Surgeon: Milford Cageaniel Young Woodruff, MD;  Location: [facility];  Service: Urology;  Laterality: Right;  . Holmium laser application Right 03/03/2013    Procedure: HOLMIUM LASER APPLICATION;  Surgeon: Milford Cageaniel Young Woodruff, MD;  Location: [facility];  Service: Urology;  Laterality: Right;  . Nephrolithotomy Right 03/18/2013    Procedure: RIGHT NEPHROLITHOTOMY PERCUTANEOUS   (1 STAGE) WITH HOLMIUM LASER;  Surgeon: Milford Cageaniel Young Woodruff, MD;  Location: [facility];  Service: Urology;  Laterality: Right;  . Holmium laser application Right 03/18/2013    Procedure: HOLMIUM  LASER APPLICATION;  Surgeon: Milford Cageaniel Young Woodruff, MD;  Location: [facility];  Service: Urology;  Laterality: Right;  . Cystoscopy/retrograde/ureteroscopy Right 03/27/2013    Procedure: CYSTOSCOPY RIGHT URETEROSCOPY LASER LITHOTRIPSY  RIGHT RETROGRADE PYELOGRAM STENT PLACEMENT ON RIGHT;  Surgeon: Milford Cageaniel Young Woodruff, MD;  Location: [facility];  Service: Urology;  Laterality: Right;  . Holmium laser application Right 03/27/2013    Procedure: HOLMIUM LASER APPLICATION;  Surgeon: Milford Cageaniel Young Woodruff, MD;  Location: [facility];  Service: Urology;  Laterality: Right;  . Cystoscopy with retrograde pyelogram, ureteroscopy and stent placement Right 04/09/2013    Procedure: CYSTOSCOPY WITH STAGED URETEROSCOPY AND STENT EXCHANGE;  Surgeon: Milford Cageaniel Young Woodruff, MD;  [facility];  Service: Urology;  Laterality: Right;  . Holmium laser application Right 04/09/2013    Procedure: HOLMIUM LASER APPLICATION;  Surgeon: Milford Cageaniel Young Woodruff, MD;  [facility];  Service: Urology;  Laterality: Right;   Family History  Problem Relation Age of Onset  . Diabetes type II Mother   . Hypertension Mother   . Diabetes type II Sister    Social History  Substance Use Topics  . Smoking status: Current Some Day Smoker -- 0.25 packs/day for 0 years    Types: Cigarettes    Last Attempt to Quit: 03/11/2013  . Smokeless tobacco: Never Used     Comment: - plans to quit  . Alcohol Use: Yes     Comment: wine occ    OB History    Gravida Para Term  Preterm AB TAB SAB Ectopic Multiple Living   0 1 0 0 1  4     Review of Systems  Denies: HEADACHE, NAUSEA, ABDOMINAL PAIN, CHEST PAIN, CONGESTION, DYSURIA, SHORTNESS OF BREATH   Allergies  Review of patient's allergies indicates no known allergies.  Home Medications   Prior to Admission medications   Medication Sig Start Date End Date Taking? Authorizing Provider  ibuprofen (ADVIL,MOTRIN) 200 MG tablet Take 800 mg by mouth every  6 (six) hours as needed for mild pain.    Historical Provider, MD  naproxen (NAPROSYN) 500 MG tablet Take 1 tablet (500 mg total) by mouth 2 (two) times daily. 01/26/15   Chase Picket Ward, PA-C  oxyCODONE-acetaminophen (PERCOCET/ROXICET) 5-325 MG tablet Take 1 tablet by mouth every 6 (six) hours as needed for severe pain. 01/26/15   Chase Picket Ward, PA-C  predniSONE (DELTASONE) 10 MG tablet Take 2 tablets (20 mg total) by mouth daily. 06/14/15   Tharon Aquas, PA   Meds Ordered and Administered this Visit  Medications - No data to display  BP 151/84 mmHg  Pulse 73  Temp(Src) 98.1 F (36.7 C) (Oral)  SpO2 100%  LMP 05/14/2015 No data found.   Physical Exam NURSES NOTES AND VITAL SIGNS REVIEWED. CONSTITUTIONAL: Well developed, well nourished, no acute distress HEENT: normocephalic, atraumatic EYES: Conjunctiva normal NECK:normal ROM, supple, no adenopathy, no soft tissue swelling PULMONARY:No respiratory distress, normal effort ABDOMINAL: Soft, ND, NT BS+, No CVAT MUSCULOSKELETAL: Normal ROM of all extremities,  SKIN: warm and dry dermatitis anterior neck mild urticaria. No vesicles, not tender.  PSYCHIATRIC: Mood and affect, behavior are normal  ED Course  Procedures (including critical care time)  Labs Review Labs Reviewed - No data to display  Imaging Review No results found.   Visual Acuity Review  Right Eye Distance:   Left Eye Distance:   Bilateral Distance:    Right Eye Near:   Left Eye Near:    Bilateral Near:       RX for prednisone, and benadryl  Expect full recovery Return to work note provided.  MDM   1. Contact dermatitis     Patient is reassured that there are no issues that require transfer to higher level of care at this time or additional tests. Patient is advised to continue home symptomatic treatment. Patient is advised that if there are new or worsening symptoms to attend the emergency department, contact primary care provider, or  return to UC. Instructions of care provided discharged home in stable condition.    THIS NOTE WAS GENERATED USING A VOICE RECOGNITION SOFTWARE PROGRAM. ALL REASONABLE EFFORTS  WERE MADE TO PROOFREAD THIS DOCUMENT FOR ACCURACY.  I have verbally reviewed the discharge instructions with the patient. A printed AVS was given to the patient.  All questions were answered prior to discharge.      Tharon Aquas, [place] 06/15/15 [phone]

## 2015-07-01 ENCOUNTER — Encounter (HOSPITAL_COMMUNITY): Payer: Self-pay | Admitting: Emergency Medicine

## 2015-07-01 ENCOUNTER — Ambulatory Visit (HOSPITAL_COMMUNITY)
Admission: EM | Admit: 2015-07-01 | Discharge: 2015-07-01 | Disposition: A | Discharge status: Home / Self Care | Payer: Self-pay | Attending: Family Medicine | Admitting: Family Medicine

## 2015-07-01 DIAGNOSIS — H109 Unspecified conjunctivitis: Secondary | ICD-10-CM

## 2015-07-01 MED ORDER — PREDNISONE 10 MG PO TABS: 20.0000 mg | ORAL_TABLET | Freq: Every day | ORAL | Status: DC

## 2015-07-01 MED ORDER — METHYLPREDNISOLONE SODIUM SUCC 125 MG IJ SOLR: 125.0000 mg | Freq: Once | INTRAMUSCULAR | Status: DC

## 2015-07-01 MED ORDER — POLYMYXIN B-TRIMETHOPRIM 10000-0.1 UNIT/ML-% OP SOLN: OPHTHALMIC | Status: DC

## 2015-07-01 MED ORDER — KETOROLAC TROMETHAMINE 60 MG/2ML IM SOLN: 60.0000 mg | Freq: Once | INTRAMUSCULAR | Status: DC

## 2015-07-01 NOTE — ED Provider Notes (Signed)
CSN: [id number]     Arrival date & time 07/01/15  1433 History   None    Chief Complaint  Patient presents with  . Eye Problem   (Consider location/radiation/quality/duration/timing/severity/associated sxs/prior Treatment) Patient is a 40 y.o. female presenting with eye problem. The history is provided by the patient.  Eye Problem Location:  Both Quality:  Tearing and burning Severity:  Moderate Onset quality:  Sudden Duration:  2 days Timing:  Constant Progression:  Worsening Chronicity:  Recurrent Relieved by:  Nothing Worsened by:  Nothing tried Ineffective treatments:  None tried Associated symptoms: redness     Past Medical History  Diagnosis Date  . Eczema   . Yeast infection     current- 03/25/13- has notified Dr Margarita GrizzleWoodruff. per patient --  PT STATES 04-01-2013  IMPROVING BUT STILL IRRITATED  . Right nephrolithiasis   . Right wrist tendonitis   . History of kidney stones   . H/O nephrolithotomy with removal of calculi     RIGHT FLANK OPEN INCISION AREA--  CHANGE DRESSING DAILY   Past Surgical History  Procedure Laterality Date  . Ectopic pregnancy surgery  2005  . Cesarean section with bilateral tubal ligation Right 10/18/2012    Procedure: REPEAT CESAREAN SECTION WITH BILATERAL TUBAL LIGATION;  Surgeon: Oliver PilaKathy W Richardson, MD;  Location: [facility];  Service: Obstetrics;  Laterality: Right; TOTAL OF 4 C SECTIONS  . Nephrolithotomy Right 03/03/2013    Procedure: Cystoscopy, Right Ureteroscopy with holmium laser With Strent;  Surgeon: Milford Cageaniel Young Woodruff, MD;  Location: [facility];  Service: Urology;  Laterality: Right;  . Holmium laser application Right 03/03/2013    Procedure: HOLMIUM LASER APPLICATION;  Surgeon: Milford Cageaniel Young Woodruff, MD;  Location: [facility];  Service: Urology;  Laterality: Right;  . Nephrolithotomy Right 03/18/2013    Procedure: RIGHT NEPHROLITHOTOMY PERCUTANEOUS   (1 STAGE) WITH HOLMIUM LASER;  Surgeon: Milford Cageaniel Young Woodruff, MD;  Location: [facility];  Service:  Urology;  Laterality: Right;  . Holmium laser application Right 03/18/2013    Procedure: HOLMIUM LASER APPLICATION;  Surgeon: Milford Cageaniel Young Woodruff, MD;  Location: [facility];  Service: Urology;  Laterality: Right;  . Cystoscopy/retrograde/ureteroscopy Right 03/27/2013    Procedure: CYSTOSCOPY RIGHT URETEROSCOPY LASER LITHOTRIPSY  RIGHT RETROGRADE PYELOGRAM STENT PLACEMENT ON RIGHT;  Surgeon: Milford Cageaniel Young Woodruff, MD;  Location: [facility];  Service: Urology;  Laterality: Right;  . Holmium laser application Right 03/27/2013    Procedure: HOLMIUM LASER APPLICATION;  Surgeon: Milford Cageaniel Young Woodruff, MD;  Location: [facility];  Service: Urology;  Laterality: Right;  . Cystoscopy with retrograde pyelogram, ureteroscopy and stent placement Right 04/09/2013    Procedure: CYSTOSCOPY WITH STAGED URETEROSCOPY AND STENT EXCHANGE;  Surgeon: Milford Cageaniel Young Woodruff, MD;  [facility] [place];  Service: Urology;  Laterality: Right;  . Holmium laser application Right 04/09/2013    Procedure: HOLMIUM LASER APPLICATION;  Surgeon: Milford Cageaniel Young Woodruff, MD;  [facility] ;  Service: Urology;  Laterality: Right;   Family History  Problem Relation Age of Onset  . Diabetes type II Mother   . Hypertension Mother   . Diabetes type II Sister    Social History  Substance Use Topics  . Smoking status: Current Some Day Smoker -- 0.25 packs/day for 0 years    Types: Cigarettes    Last Attempt to Quit: 03/11/2013  . Smokeless tobacco: Never Used     Comment: - plans to quit  . Alcohol Use: Yes     Comment: wine occ  OB History    Gravida Para Term Preterm AB TAB SAB Ectopic Multiple Living   5 4 4  0 1 0 0 1  4     Review of Systems  Constitutional: Negative.   HENT: Negative.   Eyes: Positive for redness.  Respiratory: Negative.   Cardiovascular: Negative.   Gastrointestinal: Negative.   Endocrine: Negative.   Genitourinary: Negative.   Musculoskeletal: Negative.   Skin: Negative.    Allergic/Immunologic: Negative.   Neurological: Negative.   Hematological: Negative.   Psychiatric/Behavioral: Negative.     Allergies  Review of patient's allergies indicates no known allergies.  Home Medications   Prior to Admission medications   Medication Sig Start Date End Date Taking? Authorizing Provider  ibuprofen (ADVIL,MOTRIN) 200 MG tablet Take 800 mg by mouth every 6 (six) hours as needed for mild pain.    Historical Provider, MD  naproxen (NAPROSYN) 500 MG tablet Take 1 tablet (500 mg total) by mouth 2 (two) times daily. 01/26/15   Chase PicketJaime Pilcher Ward, PA-C  oxyCODONE-acetaminophen (PERCOCET/ROXICET) 5-325 MG tablet Take 1 tablet by mouth every 6 (six) hours as needed for severe pain. 01/26/15   Chase PicketJaime Pilcher Ward, PA-C  predniSONE (DELTASONE) 10 MG tablet Take 2 tablets (20 mg total) by mouth daily. 07/01/15   Deatra CanterWilliam J Oxford, FNP  trimethoprim-polymyxin b (POLYTRIM) ophthalmic solution 2 gtt's OU every 2-6 hours as directed 07/01/15   Deatra CanterWilliam J Oxford, FNP   Meds Ordered and Administered this Visit  Medications - No data to display  BP 131/74 mmHg  Pulse 72  Temp(Src) 97.9 F (36.6 C) (Oral)  Resp 16  SpO2 99%  LMP 04/30/2015 No data found.   Physical Exam  Constitutional: She appears well-developed and well-nourished.  HENT:  Head: Normocephalic.  Right Ear: External ear normal.  Left Ear: External ear normal.  Mouth/Throat: Oropharynx is clear and moist.  Eyes: Pupils are equal, round, and reactive to light.  OD - conjunctiva injected and sclera erythematous. OS - conjunctiva injected and left periorbital edema.    ED Course  Procedures (including critical care time)  Labs Review Labs Reviewed - No data to display  Imaging Review No results found.   Visual Acuity Review  Right Eye Distance: 20/25 Left Eye Distance: 20/20 Bilateral Distance: 20/20  Right Eye Near:   Left Eye Near:    Bilateral Near:         MDM   1. Bilateral  conjunctivitis    Polytrim eye drops 2 gtt's q2-6 hours x 7 days #1010ml    Deatra CanterWilliam J Oxford, FNP 07/01/15 1709

## 2015-07-01 NOTE — ED Notes (Signed)
Here for bilateral eye irritation onset x2 days Reports she used a face wash and got some particles in her eyes Since then, she's had redness, pain and discharge and blurred vision A&O x4... No acute distress.

## 2015-07-01 NOTE — Discharge Instructions (Signed)

## 2015-10-31 ENCOUNTER — Emergency Department (HOSPITAL_COMMUNITY): Payer: Self-pay

## 2015-10-31 ENCOUNTER — Encounter (HOSPITAL_COMMUNITY): Payer: Self-pay | Admitting: *Deleted

## 2015-10-31 ENCOUNTER — Emergency Department (HOSPITAL_COMMUNITY)
Admission: EM | Admit: 2015-10-31 | Discharge: 2015-10-31 | Disposition: A | Discharge status: Home / Self Care | Payer: Self-pay | Attending: Emergency Medicine | Admitting: Emergency Medicine

## 2015-10-31 DIAGNOSIS — F17210 Nicotine dependence, cigarettes, uncomplicated: Secondary | ICD-10-CM | POA: Insufficient documentation

## 2015-10-31 DIAGNOSIS — N1339 Other hydronephrosis: Secondary | ICD-10-CM | POA: Insufficient documentation

## 2015-10-31 DIAGNOSIS — N200 Calculus of kidney: Secondary | ICD-10-CM

## 2015-10-31 DIAGNOSIS — N201 Calculus of ureter: Secondary | ICD-10-CM | POA: Insufficient documentation

## 2015-10-31 LAB — URINALYSIS, ROUTINE W REFLEX MICROSCOPIC
BILIRUBIN URINE: NEGATIVE
GLUCOSE, UA: NEGATIVE mg/dL
KETONES UR: NEGATIVE mg/dL
NITRITE: NEGATIVE
PH: 6 (ref 5.0–8.0)
Protein, ur: NEGATIVE mg/dL
SPECIFIC GRAVITY, URINE: 1.014 (ref 1.005–1.030)

## 2015-10-31 LAB — URINE MICROSCOPIC-ADD ON

## 2015-10-31 LAB — POC URINE PREG, ED: Preg Test, Ur: NEGATIVE

## 2015-10-31 MED ORDER — CEPHALEXIN 500 MG PO CAPS: 500.0000 mg | ORAL_CAPSULE | Freq: Two times a day (BID) | ORAL | 0 refills | Status: DC

## 2015-10-31 MED ORDER — TAMSULOSIN HCL 0.4 MG PO CAPS: 0.4000 mg | ORAL_CAPSULE | Freq: Every day | ORAL | 0 refills | Status: DC

## 2015-10-31 MED ORDER — OXYCODONE-ACETAMINOPHEN 5-325 MG PO TABS: 1.0000 | ORAL_TABLET | ORAL | 0 refills | Status: DC | PRN

## 2015-10-31 MED ORDER — TAMSULOSIN HCL 0.4 MG PO CAPS
0.4000 mg | ORAL_CAPSULE | Freq: Every day | ORAL | Status: DC
  Administered 2015-10-31: 0.4 mg via ORAL
  Filled 2015-10-31: qty 1

## 2015-10-31 MED ORDER — KETOROLAC TROMETHAMINE 30 MG/ML IJ SOLN
30.0000 mg | Freq: Once | INTRAMUSCULAR | Status: AC
  Administered 2015-10-31: 30 mg via INTRAVENOUS
  Filled 2015-10-31: qty 1

## 2015-10-31 NOTE — Discharge Instructions (Signed)
Please read attached information. If you experience any new or worsening signs or symptoms please return to the emergency room for evaluation. Please follow-up with your primary care provider or specialist as discussed. Please use medication prescribed only as directed and discontinue taking if you have any concerning signs or symptoms.   °

## 2015-10-31 NOTE — ED Notes (Signed)
Declined W/C at D/C and was escorted to lobby by RN. 

## 2015-10-31 NOTE — ED Triage Notes (Signed)
Patient presents with c/o right lower back pain that radiates to the right lower front.  Has history of 1 large stone that was surgically removed

## 2015-10-31 NOTE — ED Provider Notes (Signed)
[facility]-EMER[facility]ENCY DEPT Provider Note   CSN: [id number] Arrival date & time: 10/31/15  [id number]     History   Chief Complaint Chief Complaint  Patient presents with  . Nephrolithiasis    HPI [name] BloodM Auth is a 40 y.o. female.  HPI   40 year old female presents today with right flank pain. Patient reports a significant past medical history of nephrolithiasis. She had to have a stent placed for this in the past. Patient notes she was asymptomatic, notes that this morning she developed severe right flank pain, sharp in nature with radiation into the groin. Patient denies any associated nausea vomiting, fever or chills. She denies any associated urinary complaints. No medications prior to arrival.   Past Medical History:  Diagnosis Date  . Eczema   . H/O nephrolithotomy with removal of calculi    RIGHT FLANK OPEN INCISION AREA--  CHANGE DRESSING DAILY  . History of kidney stones   . Right nephrolithiasis   . Right wrist tendonitis   . Yeast infection    current- 03/25/13- has notified Dr Margarita GrizzleWoodruff. per patient --  PT STATES 04-01-2013  IMPROVING BUT STILL IRRITATED    Patient Active Problem List   Diagnosis Date Noted  . Lower back injury 11/23/2014  . Sacral back pain 11/20/2014  . Leukocytosis 11/20/2014  . History of kidney stones 11/20/2014  . Obesity 11/20/2014  . 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 S/P repeat low transverse C-section 10/18/2012  . H/O unilateral salpingectomy 10/18/2012  . UTI (lower urinary tract infection) 04/01/2012  . Renal stone 04/01/2012  . Hydronephrosis 04/01/2012  . Pregnancy 04/01/2012    Past Surgical History:  Procedure Laterality Date  . CESAREAN SECTION WITH BILATERAL TUBAL LIGATION Right 10/18/2012   Procedure: REPEAT CESAREAN SECTION WITH BILATERAL TUBAL LIGATION;  Surgeon: Oliver PilaKathy W Richardson, MD;  Location: [facility];  Service: Obstetrics;  Laterality: Right;  TOTAL OF 4 C SECTIONS  . CYSTOSCOPY WITH RETROGRADE PYELOGRAM, URETEROSCOPY AND STENT PLACEMENT Right 04/09/2013   Procedure: CYSTOSCOPY WITH STAGED URETEROSCOPY AND STENT EXCHANGE;  Surgeon: Milford Cageaniel Young Woodruff, MD;  [facility] [place];  Service: Urology;  Laterality: Right;  . CYSTOSCOPY/RETROGRADE/URETEROSCOPY Right 03/27/2013   Procedure: CYSTOSCOPY RIGHT URETEROSCOPY LASER LITHOTRIPSY  RIGHT RETROGRADE PYELOGRAM STENT PLACEMENT ON RIGHT;  Surgeon: Milford Cageaniel Young Woodruff, MD;  Location: [facility];  Service: Urology;  Laterality: Right;  . ECTOPIC PREGNANCY SURGERY  2005  . HOLMIUM LASER APPLICATION Right 03/03/2013   Procedure: HOLMIUM LASER APPLICATION;  Surgeon: Milford Cageaniel Young Woodruff, MD;  Location: [facility];  Service: Urology;  Laterality: Right;  . HOLMIUM LASER APPLICATION Right 03/18/2013   Procedure: HOLMIUM LASER APPLICATION;  Surgeon: Milford Cageaniel Young Woodruff, MD;  Location: [facility];  Service: Urology;  Laterality: Right;  . HOLMIUM LASER APPLICATION Right 03/27/2013   Procedure: HOLMIUM LASER APPLICATION;  Surgeon: Milford Cageaniel Young Woodruff, MD;  Location: [facility];  Service: Urology;  Laterality: Right;  . HOLMIUM LASER APPLICATION Right 04/09/2013   Procedure: HOLMIUM LASER APPLICATION;  Surgeon: Milford Cageaniel Young Woodruff, MD;  [facility] [place];  Service: Urology;  Laterality: Right;  . NEPHROLITHOTOMY Right 03/03/2013   Procedure: Cystoscopy, Right Ureteroscopy with holmium laser With Strent;  Surgeon: Milford Cageaniel Young Woodruff, MD;  Location: [facility];  Service: Urology;  Laterality: Right;  . NEPHROLITHOTOMY Right 03/18/2013   Procedure: RIGHT NEPHROLITHOTOMY PERCUTANEOUS   (1 STAGE) WITH HOLMIUM LASER;  Surgeon: Milford Cageaniel Young Woodruff, MD;  [facility];  Service: Urology;  Laterality: Right;    OB History    Gravida Para Term Preterm AB Living   5 4 4  0 1 4   SAB TAB Ectopic Multiple Live Births   0 0 1   1       Home Medications    Prior to Admission  medications   Medication Sig Start Date End Date Taking? Authorizing Provider  ibuprofen (ADVIL,MOTRIN) 200 MG tablet Take 800 mg by mouth every 6 (six) hours as needed for mild pain.   Yes Historical Provider, MD  cephALEXin (KEFLEX) 500 MG capsule Take 1 capsule (500 mg total) by mouth 2 (two) times daily. 10/31/15   Eyvonne MechanicJeffrey Gearl Baratta, PA-C  oxyCODONE-acetaminophen (PERCOCET/ROXICET) 5-325 MG tablet Take 1-2 tablets by mouth every 4 (four) hours as needed for severe pain. 10/31/15   Eyvonne MechanicJeffrey Lorelei Heikkila, PA-C  tamsulosin (FLOMAX) 0.4 MG CAPS capsule Take 1 capsule (0.4 mg total) by mouth daily. 10/31/15   Eyvonne MechanicJeffrey Jebediah Macrae, PA-C    Family History Family History  Problem Relation Age of Onset  . Diabetes type II Mother   . Hypertension Mother   . Diabetes type II Sister     Social History Social History  Substance Use Topics  . Smoking status: Current Some Day Smoker    Packs/day: 0.25    Years: 0.00    Types: Cigarettes    Last attempt to quit: 03/11/2013  . Smokeless tobacco: Never Used     Comment: - plans to quit  . Alcohol use Yes     Comment: wine occ      Allergies   Review of patient's allergies indicates no known allergies.   Review of Systems Review of Systems  All other systems reviewed and are negative.    Physical Exam Updated Vital Signs BP 112/71 (BP Location: Left Arm)   Pulse 89   Temp 98.7 F (37.1 C) (Oral)   Resp 20   Ht 5' (1.524 m)   Wt 72.6 kg   LMP 10/17/2015   SpO2 99%   BMI 31.25 kg/m   Physical Exam  Constitutional: She is oriented to person, place, and time. She appears well-developed and well-nourished.  HENT:  Head: Normocephalic and atraumatic.  Eyes: Conjunctivae are normal. Pupils are equal, round, and reactive to light. Right eye exhibits no discharge. Left eye exhibits no discharge. No scleral icterus.  Neck: Normal range of motion. No JVD present. No tracheal deviation present.  Cardiovascular: Normal rate.   Pulmonary/Chest:  Effort normal. No stridor.  Abdominal: Soft. Bowel sounds are normal. She exhibits no distension and no mass. There is no tenderness. There is no rebound and no guarding. No hernia.  Neurological: She is alert and oriented to person, place, and time. Coordination normal.  Skin: Skin is warm and dry.  Psychiatric: She has a normal mood and affect. Her behavior is normal. Judgment and thought content normal.  Nursing note and vitals reviewed.    ED Treatments / Results  Labs (all labs ordered are listed, but only abnormal results are displayed) Labs Reviewed  URINALYSIS, ROUTINE W REFLEX MICROSCOPIC (NOT AT [facility]) - Abnormal; Notable for the following:       Result Value   Color, Urine STRAW (*)    Hgb urine dipstick MODERATE (*)    Leukocytes, UA MODERATE (*)    All other components within normal limits  URINE MICROSCOPIC-ADD ON - Abnormal; Notable for the following:    Squamous Epithelial / LPF 0-5 (*)  Bacteria, UA MANY (*)    All other components within normal limits  POC URINE PREG, ED    EKG  EKG Interpretation None       Radiology Ct Renal Stone Study  Result Date: 10/31/2015 CLINICAL DATA:  Right flank pain since 2 a.m. EXAM: CT ABDOMEN AND PELVIS WITHOUT CONTRAST TECHNIQUE: Multidetector CT imaging of the abdomen and pelvis was performed following the standard protocol without IV contrast. COMPARISON:  03/04/2013 FINDINGS: Lower chest: Minimal atelectasis Hepatobiliary: Several gallstones are layering in the gallbladder. Unremarkable visualized liver. Pancreas: Unremarkable Spleen: Unremarkable.  Splenules. Adrenals/Urinary Tract: Adrenal glands are unremarkable. Left kidney is unremarkable other than a small simple cyst. Moderate right hydronephrosis. There are calculi in the lower pole of the right kidney. There is stranding likely related to a prior right nephrostomy catheter. Cortical calcifications in the lower pole are likely dystrophic. There is hydroureter and a 6  mm calculus at the right ureterovesical junction. Stomach/Bowel: Normal appendix. No evidence of small-bowel obstruction. No obvious mass in the colon. Vascular/Lymphatic: No abnormal retroperitoneal adenopathy. No aortic aneurysm. Reproductive: Uterus and adnexa are unremarkable. Other: No free-fluid. Musculoskeletal: No vertebral compression. Degenerative change of the SI joints. IMPRESSION: 6 mm right ureterovesical calculus is associated with right hydronephrosis. Right nephrolithiasis. Evidence of prior nephrostomy catheter on the right is noted. Cholelithiasis. Electronically Signed   By: Jolaine Click M.D.   On: 10/31/2015 08:36    Procedures Procedures (including critical care time)  Medications Ordered in ED Medications  tamsulosin (FLOMAX) capsule 0.4 mg (not administered)  ketorolac (TORADOL) 30 MG/ML injection 30 mg (30 mg Intravenous Given 10/31/15 0617)     Initial Impression / Assessment and Plan / ED Course  I have reviewed the triage vital signs and the nursing notes.  Pertinent labs & imaging results that were available during my care of the patient were reviewed by me and considered in my medical decision making (see chart for details).  Clinical Course     Final Clinical Impressions(s) / ED Diagnoses   Final diagnoses:  Nephrolithiasis    40 year old female presents today with nephrolithiasis. Patient has a 6 mm right UVJ calculus with right-sided hydronephrosis. Patient was given Toradol here which dramatically improved her symptoms. She had no episodes of nausea or vomiting. Patient had no urinary complaints, but has urinalysis consistent with infection. Due to stone she will be covered with antibiotics. She was given Flomax, Percocet, and urology follow-up. She is given strict return precautions, she verbalized understanding and agreement to today's plan had no further questions concerns  New Prescriptions New Prescriptions   CEPHALEXIN (KEFLEX) 500 MG CAPSULE     Take 1 capsule (500 mg total) by mouth 2 (two) times daily.   OXYCODONE-ACETAMINOPHEN (PERCOCET/ROXICET) 5-325 MG TABLET    Take 1-2 tablets by mouth every 4 (four) hours as needed for severe pain.   TAMSULOSIN (FLOMAX) 0.4 MG CAPS CAPSULE    Take 1 capsule (0.4 mg total) by mouth daily.     Eyvonne Mechanic, PA-C 10/31/15 0915    Eyvonne Mechanic, PA-C 10/31/15 1610    Geoffery Lyons, MD 10/31/15 [phone]

## 2016-08-15 ENCOUNTER — Emergency Department (HOSPITAL_COMMUNITY)
Admission: EM | Admit: 2016-08-15 | Discharge: 2016-08-15 | Disposition: A | Discharge status: Home / Self Care | Payer: Medicaid Other | Attending: Physician Assistant | Admitting: Physician Assistant

## 2016-08-15 ENCOUNTER — Emergency Department (HOSPITAL_COMMUNITY): Payer: Medicaid Other

## 2016-08-15 ENCOUNTER — Encounter (HOSPITAL_COMMUNITY): Payer: Self-pay

## 2016-08-15 DIAGNOSIS — N3001 Acute cystitis with hematuria: Secondary | ICD-10-CM | POA: Diagnosis not present

## 2016-08-15 DIAGNOSIS — R319 Hematuria, unspecified: Secondary | ICD-10-CM | POA: Diagnosis present

## 2016-08-15 DIAGNOSIS — F17210 Nicotine dependence, cigarettes, uncomplicated: Secondary | ICD-10-CM | POA: Diagnosis not present

## 2016-08-15 DIAGNOSIS — N200 Calculus of kidney: Secondary | ICD-10-CM | POA: Diagnosis not present

## 2016-08-15 DIAGNOSIS — Z79899 Other long term (current) drug therapy: Secondary | ICD-10-CM | POA: Insufficient documentation

## 2016-08-15 LAB — I-STAT BETA HCG BLOOD, ED (MC, WL, AP ONLY): I-stat hCG, quantitative: <5 m[IU]/mL (ref <5)

## 2016-08-15 LAB — URINALYSIS, ROUTINE W REFLEX MICROSCOPIC
Bilirubin Urine: NEGATIVE
GLUCOSE, UA: NEGATIVE mg/dL
Ketones, ur: NEGATIVE mg/dL
NITRITE: POSITIVE — AB
PH: 6 (ref 5.0–8.0)
PROTEIN: 30 mg/dL — AB
Specific Gravity, Urine: 1.017 (ref 1.005–1.030)

## 2016-08-15 LAB — CBC
HCT: 36.9 % (ref 36.0–46.0)
HEMOGLOBIN: 12.5 g/dL (ref 12.0–15.0)
MCH: 26.3 pg (ref 26.0–34.0)
MCHC: 33.9 g/dL (ref 30.0–36.0)
MCV: 77.7 fL — AB (ref 78.0–100.0)
Platelets: 397 10*3/uL (ref 150–400)
RBC: 4.75 MIL/uL (ref 3.87–5.11)
RDW: 14 % (ref 11.5–15.5)
WBC: 9.5 10*3/uL (ref 4.0–10.5)

## 2016-08-15 LAB — BASIC METABOLIC PANEL
ANION GAP: 9 (ref 5–15)
BUN: 11 mg/dL (ref 6–20)
CO2: 20 mmol/L — ABNORMAL LOW (ref 22–32)
Calcium: 9 mg/dL (ref 8.9–10.3)
Chloride: 107 mmol/L (ref 101–111)
Creatinine, Ser: 0.72 mg/dL (ref 0.44–1.00)
GFR calc Af Amer: >60 mL/min (ref >60)
GFR calc non Af Amer: >60 mL/min (ref >60)
GLUCOSE: 119 mg/dL — AB (ref 65–99)
POTASSIUM: 3.9 mmol/L (ref 3.5–5.1)
Sodium: 136 mmol/L (ref 135–145)

## 2016-08-15 MED ORDER — CYCLOBENZAPRINE HCL 10 MG PO TABS: 10.0000 mg | ORAL_TABLET | Freq: Two times a day (BID) | ORAL | 0 refills | Status: DC | PRN

## 2016-08-15 MED ORDER — CEPHALEXIN 500 MG PO CAPS: 500.0000 mg | ORAL_CAPSULE | Freq: Four times a day (QID) | ORAL | 0 refills | Status: AC

## 2016-08-15 MED ORDER — CEPHALEXIN 250 MG PO CAPS
500.0000 mg | ORAL_CAPSULE | Freq: Once | ORAL | Status: AC
Start: 2016-08-15 — End: 2016-08-15
  Administered 2016-08-15: 500 mg via ORAL
  Filled 2016-08-15: qty 2

## 2016-08-15 MED ORDER — KETOROLAC TROMETHAMINE 15 MG/ML IJ SOLN
15.0000 mg | Freq: Once | INTRAMUSCULAR | Status: AC
  Administered 2016-08-15: 15 mg via INTRAMUSCULAR
  Filled 2016-08-15: qty 1

## 2016-08-15 NOTE — ED Triage Notes (Signed)
Per Pt, Pt is coming from home with complaints of right lower back pain along with blood in urine that started three days ago. Pt reports some pressure with urination and Hx of Kidney Stones. Secondarily, pt has swelling and pain to the right neck that is uncomfortable.

## 2016-08-15 NOTE — ED Provider Notes (Signed)
[facility]-EMERGENCY DEPT Provider Note   CSN: [id number] Arrival date & time: 08/15/16  8295     History   Chief Complaint Chief Complaint  Patient presents with  . Hematuria    HPI [name] is a 41 y.o. female.  HPI  Patient's a 41 year old female presenting with blood in her urine. Patient reports that blood in her urine for last several days. She reports no urinary urgency or frequency. She reports she's had past emdical history of kidney stones. She reports multiple lithotripsies in the past. She reports she's had lack of follow-up with urology due to lack of insurance. She reports mild pain in the back. She reports "I been able to deal with at home". Patient presents very comfortable on exam.  She also is having some muscle pain in her right trapezius, reports better with massage. She reports it feels like a spasm. No weakness no trauma.   Past Medical History:  Diagnosis Date  . Eczema   . H/O nephrolithotomy with removal of calculi    RIGHT FLANK OPEN INCISION AREA--  CHANGE DRESSING DAILY  . History of kidney stones   . Right nephrolithiasis   . Right wrist tendonitis   . Yeast infection    current- 03/25/13- has notified Dr Margarita Grizzle. per patient --  PT STATES 04-01-2013  IMPROVING BUT STILL IRRITATED    Patient Active Problem List   Diagnosis Date Noted  . Lower back injury 11/23/2014  . Sacral back pain 11/20/2014  . Leukocytosis 11/20/2014  . History of kidney stones 11/20/2014  . Obesity 11/20/2014  . 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 S/P repeat low transverse C-section 10/18/2012  . H/O unilateral salpingectomy 10/18/2012  . UTI (lower urinary tract infection) 04/01/2012  . Renal stone 04/01/2012  . Hydronephrosis 04/01/2012  . Pregnancy 04/01/2012    Past Surgical History:  Procedure Laterality Date  . CESAREAN SECTION WITH BILATERAL TUBAL LIGATION Right 10/18/2012   Procedure: REPEAT CESAREAN SECTION WITH BILATERAL TUBAL LIGATION;  Surgeon: Oliver Pila, MD;  Location: [facility];  Service: Obstetrics;  Laterality: Right; TOTAL OF 4 C SECTIONS  . CYSTOSCOPY WITH RETROGRADE PYELOGRAM, URETEROSCOPY AND STENT PLACEMENT Right 04/09/2013   Procedure: CYSTOSCOPY WITH STAGED URETEROSCOPY AND STENT EXCHANGE;  Surgeon: Milford Cage, MD;  Location: [facility];  Service: Urology;  Laterality: Right;  . CYSTOSCOPY/RETROGRADE/URETEROSCOPY Right 03/27/2013   Procedure: CYSTOSCOPY RIGHT URETEROSCOPY LASER LITHOTRIPSY  RIGHT RETROGRADE PYELOGRAM STENT PLACEMENT ON RIGHT;  Surgeon: Milford Cage, MD;  Location: [facility];  Service: Urology;  Laterality: Right;  . ECTOPIC PREGNANCY SURGERY  2005  . HOLMIUM LASER APPLICATION Right 03/03/2013   Procedure: HOLMIUM LASER APPLICATION;  Surgeon: Milford Cage, MD;  Location: [facility];  Service: Urology;  Laterality: Right;  . HOLMIUM LASER APPLICATION Right 03/18/2013   Procedure: HOLMIUM LASER APPLICATION;  Surgeon: Milford Cage, MD;  Location: [facility];  Service: Urology;  Laterality: Right;  . HOLMIUM LASER APPLICATION Right 03/27/2013   Procedure: HOLMIUM LASER APPLICATION;  Surgeon: Milford Cage, MD;  Location: [facility];  Service: Urology;  Laterality: Right;  . HOLMIUM LASER APPLICATION Right 04/09/2013   Procedure: HOLMIUM LASER APPLICATION;  Surgeon: Milford Cage, MD;  Location: [facility];  Service: Urology;  Laterality: Right;  . NEPHROLITHOTOMY Right 03/03/2013   Procedure: Cystoscopy, Right Ureteroscopy with holmium laser With Strent;  Surgeon: Milford Cage, MD;  Location:  [facility];  Service: Urology;  Laterality: Right;  . NEPHROLITHOTOMY Right 03/18/2013   Procedure: RIGHT NEPHROLITHOTOMY PERCUTANEOUS   (1 STAGE) WITH HOLMIUM LASER;  Surgeon: Milford Cageaniel Young Woodruff, MD;  Location: [facility];  Service: Urology;  Laterality: Right;    OB History     Gravida Para Term Preterm AB Living   5 4 4  0 1 4   SAB TAB Ectopic Multiple Live Births   0 0 1   1       Home Medications    Prior to Admission medications   Medication Sig Start Date End Date Taking? Authorizing Provider  cephALEXin (KEFLEX) 500 MG capsule Take 1 capsule (500 mg total) by mouth 2 (two) times daily. 10/31/15   Hedges, Tinnie GensJeffrey, PA-C  ibuprofen (ADVIL,MOTRIN) 200 MG tablet Take 800 mg by mouth every 6 (six) hours as needed for mild pain.    [provider]  oxyCODONE-acetaminophen (PERCOCET/ROXICET) 5-325 MG tablet Take 1-2 tablets by mouth every 4 (four) hours as needed for severe pain. 10/31/15   Hedges, Tinnie GensJeffrey, PA-C  tamsulosin (FLOMAX) 0.4 MG CAPS capsule Take 1 capsule (0.4 mg total) by mouth daily. 10/31/15   Eyvonne MechanicHedges, Jeffrey, PA-C    Family History Family History  Problem Relation Age of Onset  . Diabetes type II Mother   . Hypertension Mother   . Diabetes type II Sister     Social History Social History  Substance Use Topics  . Smoking status: Current Some Day Smoker    Packs/day: 0.25    Years: 0.00    Types: Cigarettes    Last attempt to quit: 03/11/2013  . Smokeless tobacco: Never Used     Comment: - plans to quit  . Alcohol use Yes     Comment: wine occ      Allergies   Patient has no known allergies.   Review of Systems Review of Systems  Constitutional: Negative for activity change.  Respiratory: Negative for shortness of breath.   Cardiovascular: Negative for chest pain.  Gastrointestinal: Negative for abdominal distention, abdominal pain, nausea and vomiting.  Genitourinary: Positive for flank pain.  Musculoskeletal: Negative for neck pain.  All other systems reviewed and are negative.    Physical Exam Updated Vital Signs BP (!) 148/97   Pulse 70   Temp 98.8 F (37.1 C) (Oral)   Resp 18   Ht 5' (1.524 m)   Wt 72.6 kg (160 lb)   LMP 07/17/2016 (Within Weeks)   SpO2 100%   BMI 31.25 kg/m   Physical Exam    Constitutional: She is oriented to person, place, and time. She appears well-developed and well-nourished.  Patient appears very comfortable,  HENT:  Head: Normocephalic and atraumatic.  Eyes: Right eye exhibits no discharge. Left eye exhibits no discharge.  Neck:  Tenderness to palpation of right trapezius muscle belly.  Cardiovascular: Normal rate, regular rhythm and normal heart sounds.   No murmur heard. Pulmonary/Chest: Effort normal and breath sounds normal. She has no wheezes. She has no rales.  Abdominal: Soft. She exhibits no distension. There is no tenderness.  No CVA tenderness.  Neurological: She is oriented to person, place, and time.  Skin: Skin is warm and dry. She is not diaphoretic.  Psychiatric: She has a normal mood and affect.  Nursing note and vitals reviewed.    ED Treatments / Results  Labs (all labs ordered are listed, but only abnormal results are displayed) Labs Reviewed  URINALYSIS, ROUTINE W REFLEX MICROSCOPIC - Abnormal;  Notable for the following:       Result Value   APPearance HAZY (*)    Hgb urine dipstick LARGE (*)    Protein, ur 30 (*)    Nitrite POSITIVE (*)    Leukocytes, UA MODERATE (*)    Bacteria, UA MANY (*)    Squamous Epithelial / LPF 0-5 (*)    All other components within normal limits  BASIC METABOLIC PANEL - Abnormal; Notable for the following:    CO2 20 (*)    Glucose, Bld 119 (*)    All other components within normal limits  CBC - Abnormal; Notable for the following:    MCV 77.7 (*)    All other components within normal limits  I-STAT BETA HCG BLOOD, ED ([facility], [facility], [facility] ONLY)    EKG  EKG Interpretation None       Radiology No results found.  Procedures Procedures (including critical care time)  Medications Ordered in ED Medications  ketorolac (TORADOL) 15 MG/ML injection 15 mg (not administered)     Initial Impression / Assessment and Plan / ED Course  I have reviewed the triage vital signs and the nursing  notes.  Pertinent labs & imaging results that were available during my care of the patient were reviewed by me and considered in my medical decision making (see chart for details).     Well appearing 41 year old female presenting with hematuria. Patient has nitrate positive urinalysis. I suspect that this is hematuria secondary to urinary tract infection. However given history of stones and presence of infection, we'll get CT to make sure that there is no infected stone. Patient had no fever and systemically appears well.  CT stone shows stones in renal pelvis, non obstrcuting. Given the patient has no fever, patient does not require surgery or  percutaneous drain. She appears very healthy. We'll treat as an outpatient first. We gave her strict return precautions. Follow up with urology in 24-48 hours. Patient takning PO and ambulatory with normal vitals at discharge.  Final Clinical Impressions(s) / ED Diagnoses   Final diagnoses:  None    New Prescriptions New Prescriptions   No medications on file     Abelino Derrick, MD 08/16/16 1041

## 2016-08-15 NOTE — ED Notes (Signed)
Pt verbalized understanding of d/c instructions and has no further questions. Pt stable and nAD. VSS.  

## 2016-08-15 NOTE — Discharge Instructions (Signed)
You have urinary tract infection. You do have stones but they are not obstructing. Please return if you have any fevers, can't take your antibiotics or with any other concerns. Please follow up with urology within 24-48 hours.  We are also giving you a muscle relaxant to help with your muscle spasms.

## 2016-10-04 ENCOUNTER — Emergency Department (HOSPITAL_BASED_OUTPATIENT_CLINIC_OR_DEPARTMENT_OTHER): Payer: Medicaid Other

## 2016-10-04 ENCOUNTER — Emergency Department (HOSPITAL_BASED_OUTPATIENT_CLINIC_OR_DEPARTMENT_OTHER)
Admission: EM | Admit: 2016-10-04 | Discharge: 2016-10-04 | Disposition: A | Discharge status: Home / Self Care | Payer: Medicaid Other | Attending: Emergency Medicine | Admitting: Emergency Medicine

## 2016-10-04 ENCOUNTER — Encounter (HOSPITAL_BASED_OUTPATIENT_CLINIC_OR_DEPARTMENT_OTHER): Payer: Self-pay | Admitting: *Deleted

## 2016-10-04 DIAGNOSIS — W208XXA Other cause of strike by thrown, projected or falling object, initial encounter: Secondary | ICD-10-CM | POA: Insufficient documentation

## 2016-10-04 DIAGNOSIS — F17210 Nicotine dependence, cigarettes, uncomplicated: Secondary | ICD-10-CM | POA: Diagnosis not present

## 2016-10-04 DIAGNOSIS — Y929 Unspecified place or not applicable: Secondary | ICD-10-CM | POA: Diagnosis not present

## 2016-10-04 DIAGNOSIS — Y999 Unspecified external cause status: Secondary | ICD-10-CM | POA: Diagnosis not present

## 2016-10-04 DIAGNOSIS — Y93G3 Activity, cooking and baking: Secondary | ICD-10-CM | POA: Insufficient documentation

## 2016-10-04 DIAGNOSIS — S9031XA Contusion of right foot, initial encounter: Secondary | ICD-10-CM | POA: Diagnosis not present

## 2016-10-04 DIAGNOSIS — S99921A Unspecified injury of right foot, initial encounter: Secondary | ICD-10-CM | POA: Diagnosis present

## 2016-10-04 MED ORDER — ACETAMINOPHEN 325 MG PO TABS
ORAL_TABLET | ORAL | Status: AC
  Filled 2016-10-04: qty 2

## 2016-10-04 MED ORDER — IBUPROFEN 800 MG PO TABS
800.0000 mg | ORAL_TABLET | Freq: Once | ORAL | Status: AC
  Administered 2016-10-04: 800 mg via ORAL

## 2016-10-04 MED ORDER — ACETAMINOPHEN 325 MG PO TABS
650.0000 mg | ORAL_TABLET | Freq: Once | ORAL | Status: AC
  Administered 2016-10-04: 650 mg via ORAL

## 2016-10-04 MED ORDER — IBUPROFEN 800 MG PO TABS
ORAL_TABLET | ORAL | Status: AC
  Filled 2016-10-04: qty 1

## 2016-10-04 NOTE — Discharge Instructions (Signed)
Please read instructions below. Apply ice to your foot for 20 minutes at a time. Elevate it when possible. Keep an ace wrap on for compression for the next few days to help swelling. You can take advil/ibuprofen every 6 hours as needed for pain. Follow up with your primary care provider if symptoms persist. Return to the ER for new or concerning symptoms.

## 2016-10-04 NOTE — ED Triage Notes (Signed)
Pt c/o foot injury x 30 mins ago

## 2016-10-04 NOTE — ED Provider Notes (Signed)
[facility]   CSN: [id number] Arrival date & time: 10/04/16  1035     History   Chief Complaint Chief Complaint  Patient presents with  . Foot Injury    HPI [name] is a 41 y.o. female presenting to the ED with acute onset of right foot pain that began prior to arrival. Patient states she was removing a tray of hot dogs from a warmer, when a full tray fell onto her right foot. She localizes pain to the dorsal aspect of her right foot, with associated bruising and swelling. Denies ankle pain, or pain in her toes, no wounds. Pain is made worse with palpation. No medications tried prior to arrival. Is not on anticoagulation.  The history is provided by the patient.    Past Medical History:  Diagnosis Date  . Eczema   . H/O nephrolithotomy with removal of calculi    RIGHT FLANK OPEN INCISION AREA--  CHANGE DRESSING DAILY  . History of kidney stones   . Right nephrolithiasis   . Right wrist tendonitis   . Yeast infection    current- 03/25/13- has notified Dr Margarita Grizzle. per patient --  PT STATES 04-01-2013  IMPROVING BUT STILL IRRITATED    Patient Active Problem List   Diagnosis Date Noted  . Lower back injury 11/23/2014  . Sacral back pain 11/20/2014  . Leukocytosis 11/20/2014  . History of kidney stones 11/20/2014  . Obesity 11/20/2014  . 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 S/P repeat low transverse C-section 10/18/2012  . H/O unilateral salpingectomy 10/18/2012  . UTI (lower urinary tract infection) 04/01/2012  . Renal stone 04/01/2012  . Hydronephrosis 04/01/2012  . Pregnancy 04/01/2012    Past Surgical History:  Procedure Laterality Date  . CESAREAN SECTION WITH BILATERAL TUBAL LIGATION Right 10/18/2012   Procedure: REPEAT CESAREAN SECTION WITH BILATERAL TUBAL LIGATION;  Surgeon: Oliver Pila, MD;  Location: [facility];  Service: Obstetrics;  Laterality:  Right; TOTAL OF 4 C SECTIONS  . CYSTOSCOPY WITH RETROGRADE PYELOGRAM, URETEROSCOPY AND STENT PLACEMENT Right 04/09/2013   Procedure: CYSTOSCOPY WITH STAGED URETEROSCOPY AND STENT EXCHANGE;  Surgeon: Milford Cage, MD;  [facility]  Service: Urology;  Laterality: Right;  . CYSTOSCOPY/RETROGRADE/URETEROSCOPY Right 03/27/2013   Procedure: CYSTOSCOPY RIGHT URETEROSCOPY LASER LITHOTRIPSY  RIGHT RETROGRADE PYELOGRAM STENT PLACEMENT ON RIGHT;  Surgeon: Milford Cage, MD;  Location: [facility];  Service: Urology;  Laterality: Right;  . ECTOPIC PREGNANCY SURGERY  2005  . HOLMIUM LASER APPLICATION Right 03/03/2013   Procedure: HOLMIUM LASER APPLICATION;  Surgeon: Milford Cage, MD;  Location: [facility];  Service: Urology;  Laterality: Right;  . HOLMIUM LASER APPLICATION Right 03/18/2013   Procedure: HOLMIUM LASER APPLICATION;  Surgeon: Milford Cage, MD;  Location: [facility];  Service: Urology;  Laterality: Right;  . HOLMIUM LASER APPLICATION Right 03/27/2013   Procedure: HOLMIUM LASER APPLICATION;  Surgeon: Milford Cage, MD;  Location: [facility];  Service: Urology;  Laterality: Right;  . HOLMIUM LASER APPLICATION Right 04/09/2013   Procedure: HOLMIUM LASER APPLICATION;  Surgeon: Milford Cage, MD;  Location: [facility];  Service: Urology;  Laterality: Right;  . NEPHROLITHOTOMY Right 03/03/2013   Procedure: Cystoscopy, Right Ureteroscopy with holmium laser With Strent;  Surgeon: Milford Cage, MD;  Location: [facility];  Service: Urology;  Laterality: Right;  . NEPHROLITHOTOMY Right 03/18/2013   Procedure: RIGHT NEPHROLITHOTOMY PERCUTANEOUS   (  1 STAGE) WITH HOLMIUM LASER;  Surgeon: Milford Cage, MD;  Location: [facility];  Service: Urology;  Laterality: Right;    OB History    Gravida Para Term Preterm AB Living   0 1 4   SAB TAB Ectopic Multiple Live Births   0 0 1   1       Home Medications    Prior to Admission  medications   Medication Sig Start Date End Date Taking? Authorizing Provider  cyclobenzaprine (FLEXERIL) 10 MG tablet Take 1 tablet (10 mg total) by mouth 2 (two) times daily as needed for muscle spasms. 08/15/16   Mackuen, Cindee Salt, MD    Family History Family History  Problem Relation Age of Onset  . Diabetes type II Mother   . Hypertension Mother   . Diabetes type II Sister     Social History Social History  Substance Use Topics  . Smoking status: Current Some Day Smoker    Packs/day: 0.25    Years: 0.00    Types: Cigarettes    Last attempt to quit: 03/11/2013  . Smokeless tobacco: Never Used     Comment: - plans to quit  . Alcohol use Yes     Comment: wine occ      Allergies   Other   Review of Systems Review of Systems  Musculoskeletal: Positive for arthralgias and joint swelling.  Skin: Positive for color change. Negative for wound.  Neurological: Negative for numbness.     Physical Exam Updated Vital Signs BP 140/81 (BP Location: Right Arm)   Pulse 77   Temp 98.3 F (36.8 C) (Oral)   Resp 18   Ht 5' (1.524 m)   Wt 73.5 kg (162 lb)   LMP 10/02/2016   SpO2 100%   BMI 31.64 kg/m   Physical Exam  Constitutional: She appears well-developed and well-nourished. No distress.  HENT:  Head: Normocephalic and atraumatic.  Eyes: Conjunctivae are normal.  Cardiovascular: Intact distal pulses.   Pulmonary/Chest: Effort normal.  Musculoskeletal:  Right dorsal foot with hematoma and edema, as well as associated tenderness. Normal range of motion of ankle, without tenderness. Toes with normal range of motion, without tenderness. No overlying wounds, no obvious deformity.  Neurological:  Normal sensation  Psychiatric: She has a normal mood and affect. Her behavior is normal.  Nursing note and vitals reviewed.    ED Treatments / Results  Labs (all labs ordered are listed, but only abnormal results are displayed) Labs Reviewed - No data to  display  EKG  EKG Interpretation None       Radiology Dg Foot Complete Right  Result Date: 10/04/2016 CLINICAL DATA:  Wreck trauma to the dorsum of the foot with pain bruising and swelling over the tarsal bones and metatarsal bases. EXAM: RIGHT FOOT COMPLETE - 3+ VIEW COMPARISON:  Right foot series of July 06, 2007 FINDINGS: The bones are subjectively adequately mineralized. The interphalangeal and MTP joint spaces are well-maintained. The tarsometatarsal and intertarsal joint spaces are normal. No acute tarsal or metatarsal fracture is observed. There is soft tissue swelling over the dorsum of the midfoot. There is a tiny plantar calcaneal spur. A previously demonstrated fracture through the base of the fifth metatarsal has healed. IMPRESSION: No acute fracture of the bones of the right foot is observed. There is soft tissue swelling over the dorsum of the midfoot. Electronically Signed   By: David  [place] M.D.   On: 10/04/2016 11:10  Procedures Procedures (including critical care time)  Medications Ordered in ED Medications  ibuprofen (ADVIL,MOTRIN) tablet 800 mg (800 mg Oral Given 10/04/16 1055)  acetaminophen (TYLENOL) tablet 650 mg (650 mg Oral Given 10/04/16 1056)     Initial Impression / Assessment and Plan / ED Course  I have reviewed the triage vital signs and the nursing notes.  Pertinent labs & imaging results that were available during my care of the patient were reviewed by me and considered in my medical decision making (see chart for details).     Patient with contusion to right foot. NV intact. No wounds. Not on anticoagulation. X-ray negative for acute fracture. Ace wrap applied for compression. RICE therapy recommended. Patient is safe for discharge, with PCP follow-up as needed.  Discussed results, findings, treatment and follow up. Patient advised of return precautions. Patient verbalized understanding and agreed with plan.   Final Clinical Impressions(s) / ED  Diagnoses   Final diagnoses:  Contusion of right foot, initial encounter    New Prescriptions Discharge Medication List as of 10/04/2016 11:23 AM       Russo, [place] N, PA-C 10/04/16 1140    Tegeler, Canary Brim, MD 10/04/16 1940

## 2016-10-09 ENCOUNTER — Ambulatory Visit (HOSPITAL_COMMUNITY)
Admission: EM | Admit: 2016-10-09 | Discharge: 2016-10-09 | Disposition: A | Discharge status: Home / Self Care | Payer: Medicaid Other | Attending: Urgent Care | Admitting: Urgent Care

## 2016-10-09 ENCOUNTER — Ambulatory Visit (INDEPENDENT_AMBULATORY_CARE_PROVIDER_SITE_OTHER): Payer: Medicaid Other

## 2016-10-09 ENCOUNTER — Encounter (HOSPITAL_COMMUNITY): Payer: Self-pay | Admitting: Emergency Medicine

## 2016-10-09 DIAGNOSIS — M79671 Pain in right foot: Secondary | ICD-10-CM

## 2016-10-09 DIAGNOSIS — S9031XD Contusion of right foot, subsequent encounter: Secondary | ICD-10-CM

## 2016-10-09 MED ORDER — CELECOXIB 100 MG PO CAPS: 100.0000 mg | ORAL_CAPSULE | Freq: Two times a day (BID) | ORAL | 0 refills | Status: DC

## 2016-10-09 NOTE — Discharge Instructions (Signed)
If celecoxib (Celebrex) is not covered well by Medicaid, then take over-the-counter naproxen at a dose of 400-500mg  (2 tablets) twice daily with food for pain and inflammation. You may also take Tylenol  every 6 hours. Ice for 20 minutes every 2 hours after work days.

## 2016-10-09 NOTE — ED Triage Notes (Signed)
Pt with right foot pain after dropping pan on foot last week; pt seen last week for same and told no fx noted on xray; pt sts continued pain

## 2016-10-09 NOTE — ED Provider Notes (Signed)
MRN: [id number] DOB: [birth year]  Subjective:   [name] is a 41 y.o. female presenting for chief complaint of Foot Pain  Reports ongoing right foot pain from traumatic injury. Admits that the swelling has gone down. She is seeing some bruising, redness. She continues to work without taking any NSAIDs. Denies re-injuring her foot. Denies history of heart disease, HTN.  [name] is not currently taking any medications and is allergic to other.  [name]  has a past medical history of Eczema; H/O nephrolithotomy with removal of calculi; History of kidney stones; Right nephrolithiasis; Right wrist tendonitis; and Yeast infection. Also  has a past surgical history that includes Ectopic pregnancy surgery (2005); Cesarean section with bilateral tubal ligation (Right, 10/18/2012); Nephrolithotomy (Right, 03/03/2013); Holmium laser application (Right, 03/03/2013); Nephrolithotomy (Right, 03/18/2013); Holmium laser application (Right, 03/18/2013); Cystoscopy/retrograde/ureteroscopy (Right, 03/27/2013); Holmium laser application (Right, 03/27/2013); Cystoscopy with retrograde pyelogram, ureteroscopy and stent placement (Right, 04/09/2013); and Holmium laser application (Right, 04/09/2013).  Objective:   Vitals: BP (!) 153/85 (BP Location: Right Arm)   Pulse 79   Temp 98.9 F (37.2 C) (Oral)   Resp 18   LMP 10/02/2016   SpO2 100%   Physical Exam  Constitutional: She is oriented to person, place, and time. She appears well-developed and well-nourished.  Cardiovascular: Normal rate.   Pulmonary/Chest: Effort normal.  Musculoskeletal:       Right foot: There is decreased range of motion, tenderness (over area depicted with associated ecchymosis) and swelling (trace over area depicted). There is normal capillary refill, no crepitus, no deformity and no laceration.       Feet:  Neurological: She is alert and oriented to person, place, and time.   Dg Foot Complete Right  Result Date: 10/09/2016 CLINICAL DATA:   Heavy metal pan fell onto RIGHT foot 6 days ago, persistent pain EXAM: RIGHT FOOT COMPLETE - 3+ VIEW COMPARISON:  10/04/2016 FINDINGS: Osseous mineralization normal. Joint spaces preserved. No acute fracture, dislocation, or bone destruction. Soft tissue swelling versus hematoma at the dorsum of the midfoot overlying the proximal metatarsals. IMPRESSION: No acute osseous abnormalities. Electronically Signed   By: Ulyses Southward M.D.   On: 10/09/2016 18:48    Assessment and Plan :   Contusion of right foot, subsequent encounter  Traumatic hematoma of foot, right, subsequent encounter  Foot pain, right  Anticipatory guidance provided. X-ray is negative. Start using celecoxib or naproxen. Ice after work, use Ace wrap. Return-to-clinic precautions discussed, patient verbalized understanding.   Wallis Bamberg, PA-C  Urgent Care  10/09/2016  6:26 PM    Wallis Bamberg, PA-C 10/09/16 1913

## 2016-12-07 ENCOUNTER — Ambulatory Visit: Payer: Medicaid Other | Attending: Family Medicine | Admitting: Physical Therapy

## 2017-01-16 IMAGING — DX DG SACRUM/COCCYX 2+V
3 series · 3 of 3 positions shown · non-contrast
Comparison: 03/31/2013

CLINICAL DATA: Pt had a fall down several stairs on [REDACTED]. Severe
pain and difficulty with walking and sitting down. Pt denies any hip
pain. Pain is all posterior.

EXAM:
SACRUM AND COCCYX - 2+ VIEW

[coccyx ap]
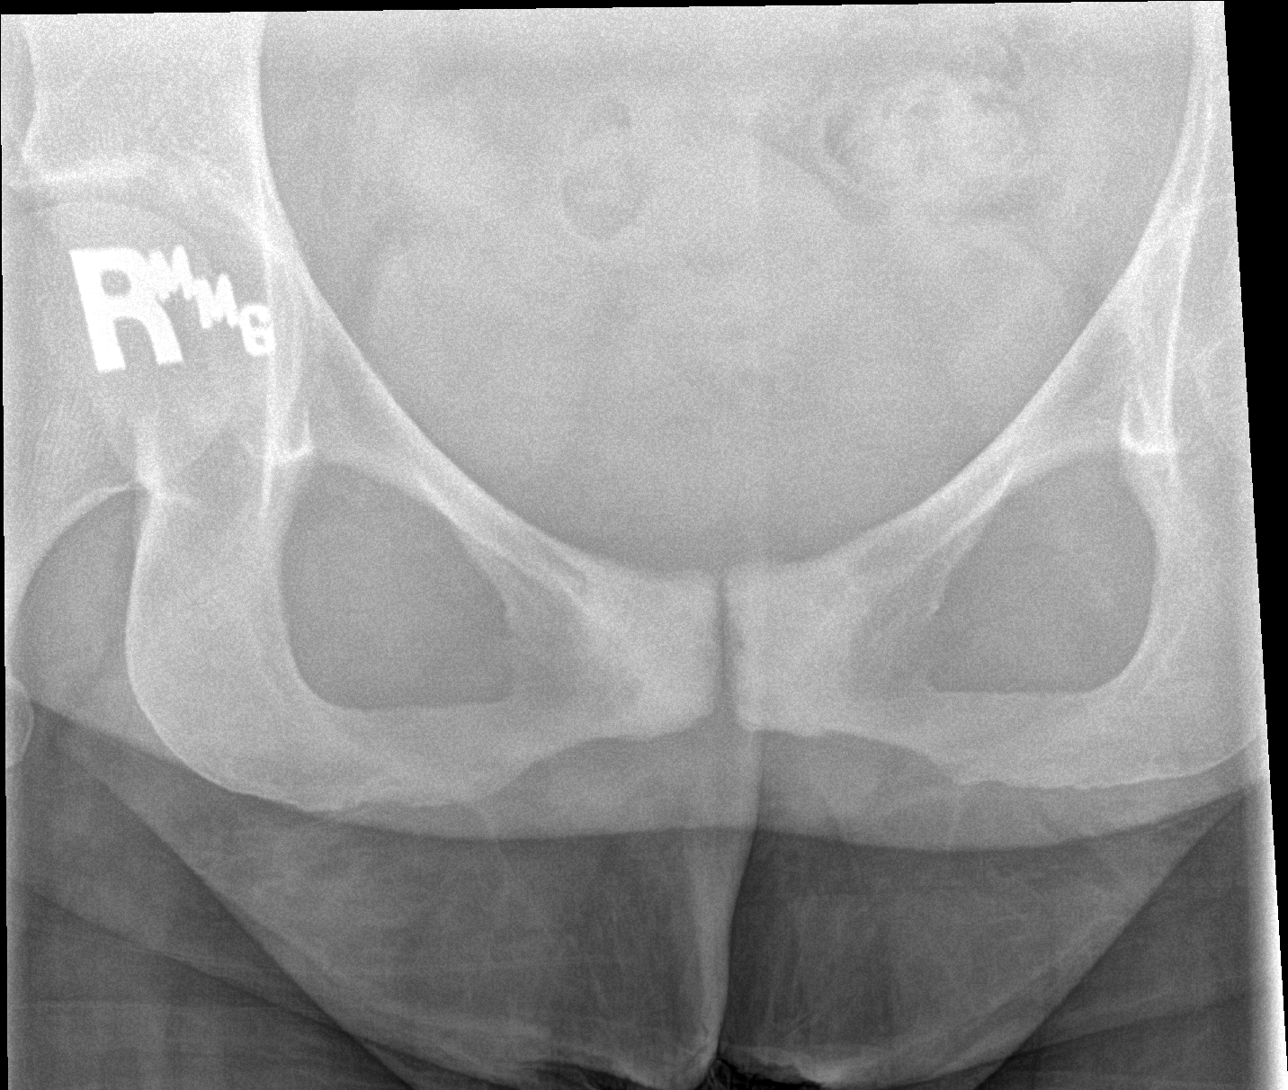

[sacrum ap]
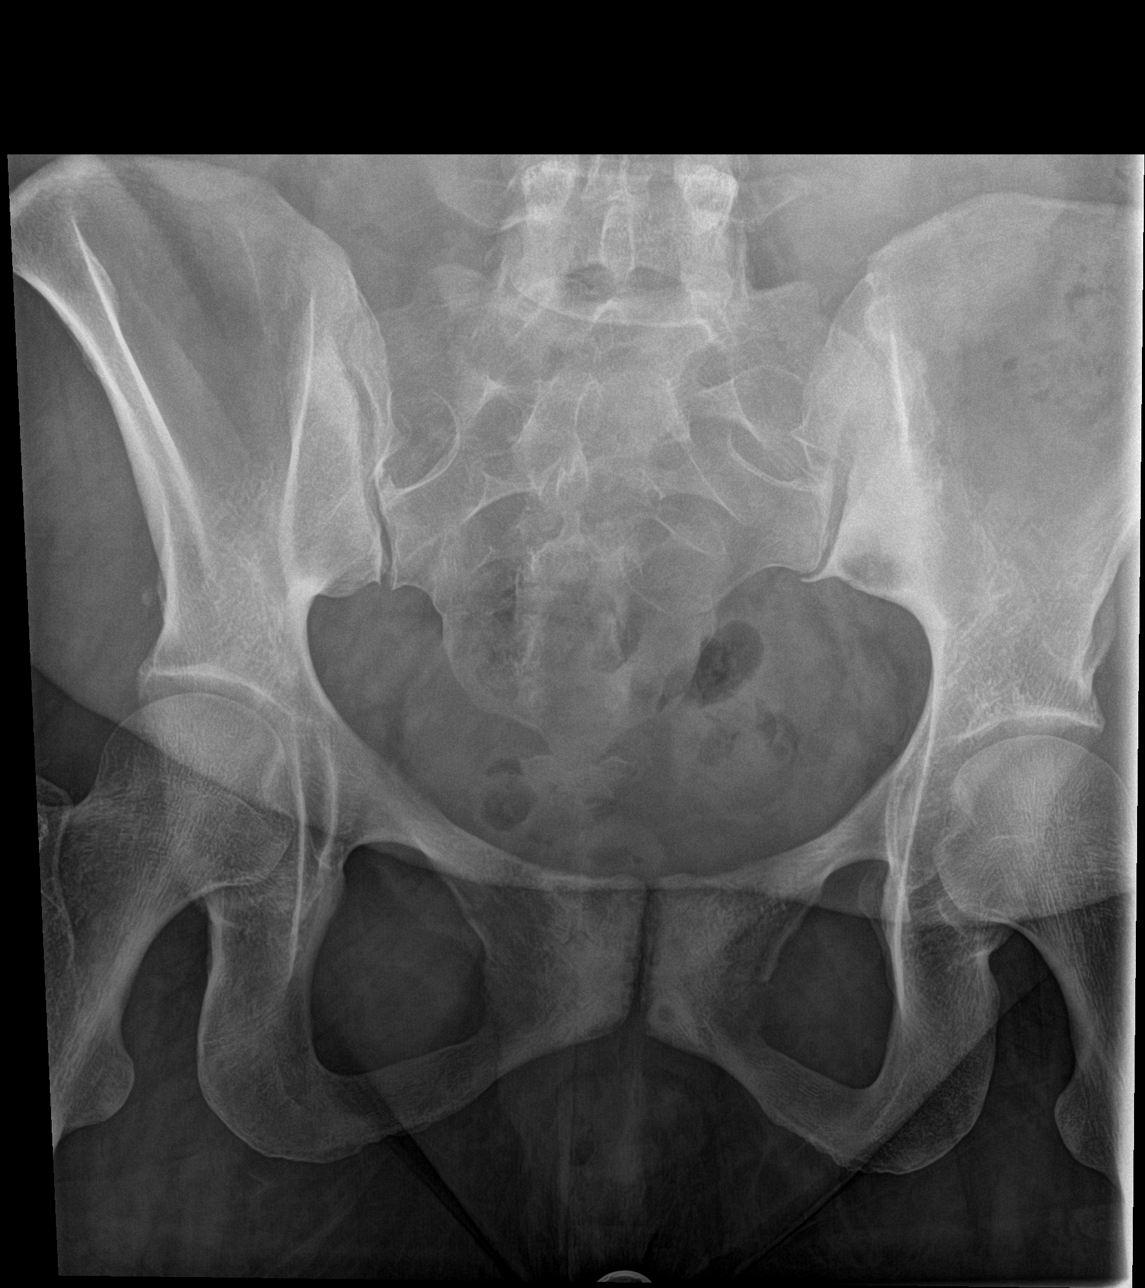

[sacrum lat]
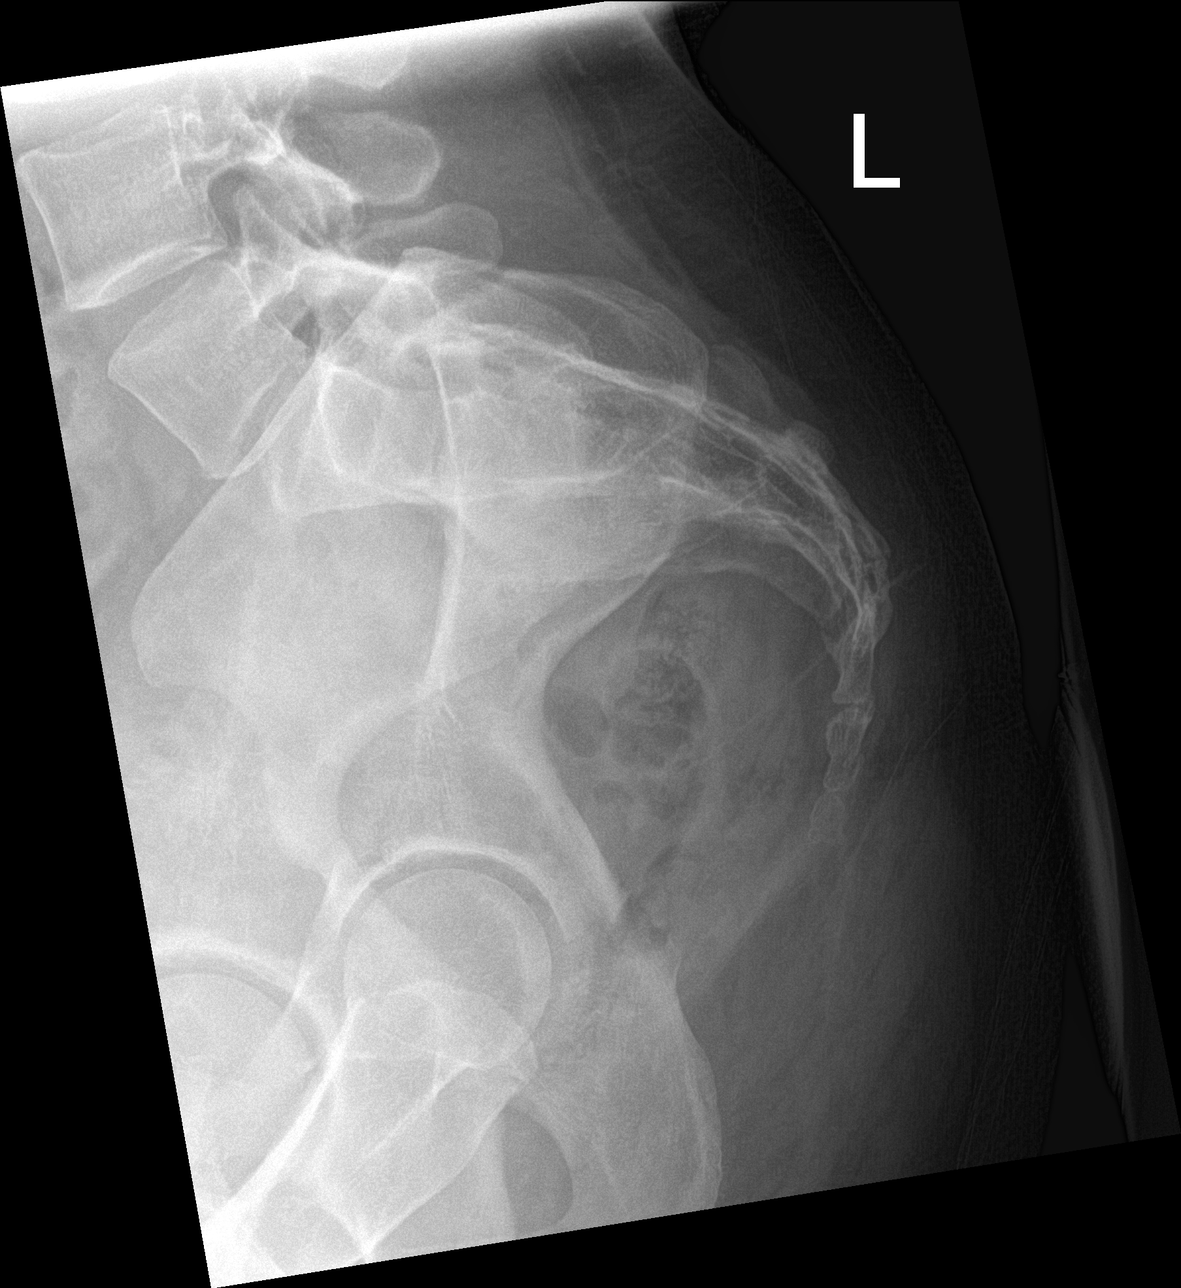

[3 of 3 positions shown; findings below may reference images not displayed]

FINDINGS: Cortical discontinuity and subtle angulation through the lower body
of the sacrum below the level of sacroiliac joints, also suspected
on the lateral radiograph, suggesting nondisplaced transverse low
sacral fracture. No involvement of the arcuate lines nor extension
to the sacroiliac joint. Chronic osteitis pubis and left
sacroiliitis.
IMPRESSION: 1. Possible low transverse sacral fracture, minimally displaced,
with no involvement of sacroiliac joints.
2. Chronic osteitis pubis and left sacroiliitis.

## 2017-06-13 ENCOUNTER — Encounter

## 2018-08-07 ENCOUNTER — Other Ambulatory Visit (HOSPITAL_COMMUNITY): Payer: Self-pay | Admitting: *Deleted

## 2018-08-07 DIAGNOSIS — N644 Mastodynia: Secondary | ICD-10-CM

## 2018-09-03 ENCOUNTER — Ambulatory Visit (HOSPITAL_COMMUNITY): Payer: Medicaid Other

## 2018-09-03 ENCOUNTER — Other Ambulatory Visit: Payer: Medicaid Other

## 2018-09-24 ENCOUNTER — Other Ambulatory Visit: Payer: Self-pay

## 2018-09-24 ENCOUNTER — Other Ambulatory Visit (HOSPITAL_COMMUNITY): Payer: Self-pay | Admitting: *Deleted

## 2018-09-24 ENCOUNTER — Ambulatory Visit (HOSPITAL_COMMUNITY)
Admission: RE | Admit: 2018-09-24 | Discharge: 2018-09-24 | Disposition: A | Discharge status: Home / Self Care | Payer: Medicaid Other | Source: Ambulatory Visit | Attending: Obstetrics and Gynecology | Admitting: Obstetrics and Gynecology

## 2018-09-24 ENCOUNTER — Encounter (HOSPITAL_COMMUNITY): Payer: Self-pay

## 2018-09-24 DIAGNOSIS — N644 Mastodynia: Secondary | ICD-10-CM

## 2018-09-24 DIAGNOSIS — Z1239 Encounter for other screening for malignant neoplasm of breast: Secondary | ICD-10-CM | POA: Insufficient documentation

## 2018-09-24 NOTE — Progress Notes (Signed)
Complaints of left outer breast pain for a couple of months. Patient states the pain is sharp. Patient states the pain comes and goes. Patient rates the pain at a 8 out of 10.  Pap Smear: Pap smear not completed today. Last Pap smear was in November 2019 at [facility] and normal per patient. Per patient has no history of an abnormal Pap smear. No Pap smear results are in Epic.  Physical exam: Breasts Breasts symmetrical. No skin abnormalities bilateral breasts. No nipple retraction bilateral breasts. No nipple discharge bilateral breasts. No lymphadenopathy. No lumps palpated bilateral breasts. Complaints of left lower breast tenderness on exam. Referred patient to the [facility] for a diagnostic mammogram. Appointment scheduled for Wednesday, September 25, 2018 at 1000.        Pelvic/Bimanual No Pap smear completed today since last Pap smear was in November 2019 per patient. Pap smear not indicated per BCCCP guidelines.   Smoking History: Patient is a current smoker. Discussed smoking cessation with patient. Referred to the [facility] Quitline and gave resources to the free smoking cessation classes at [facility].  Patient Navigation: Patient education provided. Access to services provided for patient through BCCCP program.    Breast and Cervical Cancer Risk Assessment: Patient has no family history of breast cancer, known genetic mutations, or radiation treatment to the chest before age 77. Patient has no history of cervical dysplasia, immunocompromised, or DES exposure in-utero.  Risk Assessment    Risk Scores      09/24/2018   Last edited by: Armond Hang, LPN   5-year risk: 0.4 %   Lifetime risk: 6.3 %

## 2018-09-24 NOTE — Patient Instructions (Signed)
Explained breast self awareness with [name]. Patient did not need a Pap smear today due to last Pap smear was in November 2019 per patient.  Let her know [facility] will cover Pap smears every 3 years unless has a history of abnormal Pap smears.Referred patient to the [facility] for a diagnostic mammogram. Appointment scheduled for Wednesday, September 25, 2018 at 1000. Patient aware of appointment and will be there. Discussed smoking cessation with patient. Referred to the [facility] Quitline and gave resources to the free smoking cessation classes at [facility]. [name] verbalized understanding.  [name], Arvil Chaco, RN 3:59 PM

## 2018-09-25 ENCOUNTER — Ambulatory Visit
Admission: RE | Admit: 2018-09-25 | Discharge: 2018-09-25 | Disposition: A | Discharge status: Home / Self Care | Payer: Medicaid Other | Source: Ambulatory Visit | Attending: Obstetrics and Gynecology | Admitting: Obstetrics and Gynecology

## 2018-09-25 ENCOUNTER — Encounter (HOSPITAL_COMMUNITY): Payer: Self-pay | Admitting: *Deleted

## 2018-09-25 ENCOUNTER — Ambulatory Visit
Admission: RE | Admit: 2018-09-25 | Discharge: 2018-09-25 | Disposition: A | Discharge status: Home / Self Care | Payer: No Typology Code available for payment source | Source: Ambulatory Visit | Attending: Obstetrics and Gynecology | Admitting: Obstetrics and Gynecology

## 2018-09-25 DIAGNOSIS — N644 Mastodynia: Secondary | ICD-10-CM

## 2019-07-29 ENCOUNTER — Ambulatory Visit: Payer: No Typology Code available for payment source | Admitting: Family Medicine

## 2019-09-03 ENCOUNTER — Other Ambulatory Visit: Payer: Self-pay

## 2019-09-03 ENCOUNTER — Other Ambulatory Visit: Payer: No Typology Code available for payment source

## 2019-09-03 DIAGNOSIS — Z20822 Contact with and (suspected) exposure to covid-19: Secondary | ICD-10-CM

## 2019-09-04 LAB — NOVEL CORONAVIRUS, NAA: SARS-CoV-2, NAA: NOT DETECTED

## 2019-10-26 ENCOUNTER — Other Ambulatory Visit: Payer: Self-pay

## 2019-10-26 ENCOUNTER — Emergency Department (HOSPITAL_COMMUNITY)
Admission: EM | Admit: 2019-10-26 | Discharge: 2019-10-27 | Disposition: A | Discharge status: Home / Self Care | Payer: Self-pay | Attending: Emergency Medicine | Admitting: Emergency Medicine

## 2019-10-26 ENCOUNTER — Encounter (HOSPITAL_COMMUNITY): Payer: Self-pay | Admitting: Emergency Medicine

## 2019-10-26 DIAGNOSIS — F17210 Nicotine dependence, cigarettes, uncomplicated: Secondary | ICD-10-CM | POA: Insufficient documentation

## 2019-10-26 DIAGNOSIS — S3992XA Unspecified injury of lower back, initial encounter: Secondary | ICD-10-CM

## 2019-10-26 DIAGNOSIS — W19XXXA Unspecified fall, initial encounter: Secondary | ICD-10-CM

## 2019-10-26 DIAGNOSIS — W010XXA Fall on same level from slipping, tripping and stumbling without subsequent striking against object, initial encounter: Secondary | ICD-10-CM | POA: Insufficient documentation

## 2019-10-26 NOTE — ED Triage Notes (Signed)
Patient slipped and fell [place] a bowling alley this evening , denies LOC/ ambulatory , reports pain at buttocks and lower back .

## 2019-10-27 ENCOUNTER — Emergency Department (HOSPITAL_COMMUNITY): Payer: Self-pay

## 2019-10-27 MED ORDER — KETOROLAC TROMETHAMINE 60 MG/2ML IM SOLN
30.0000 mg | Freq: Once | INTRAMUSCULAR | Status: AC
  Administered 2019-10-27: 30 mg via INTRAMUSCULAR
  Filled 2019-10-27: qty 2

## 2019-10-27 NOTE — ED Provider Notes (Signed)
[facility] EMERGENCY DEPARTMENT Provider Note  CSN: [id number] Arrival date & time: 10/26/19 2243  Chief Complaint(s) Fall  HPI [name] is a 44 y.o. female here with sacral/coccyx pain after slipping and falling onto this area at a bowling alley about 8 hrs ago. Reports running, slipping, being airborne and landing on her buttocks. Pain gradually worsened throughout the next few hours. Tried taking tylenol right after with minimal relief. Pain worse with sitting, walking, and palpation. No pain radiation. No back pain. Still able to ambulate, but painful. Reports previously breaking her tailbone and this feels like it. No bladder/bowel incontinence. No numbness or weakness.  HPI  Past Medical History Past Medical History:  Diagnosis Date  . Eczema   . H/O nephrolithotomy with removal of calculi    RIGHT FLANK OPEN INCISION AREA--  CHANGE DRESSING DAILY  . History of kidney stones   . Right nephrolithiasis   . Right wrist tendonitis   . Yeast infection    current- 03/25/13- has notified Dr Margarita Grizzle. per patient --  PT STATES 04-01-2013  IMPROVING BUT STILL IRRITATED   Patient Active Problem List   Diagnosis Date Noted  . Screening breast examination 09/24/2018  . Breast pain, left 09/24/2018  . Lower back injury 11/23/2014  . Sacral back pain 11/20/2014  . Leukocytosis 11/20/2014  . History of kidney stones 11/20/2014  . Obesity 11/20/2014  . 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 S/P repeat low transverse C-section 10/18/2012  . H/O unilateral salpingectomy 10/18/2012  . UTI (lower urinary tract infection) 04/01/2012  . Renal stone 04/01/2012  . Hydronephrosis 04/01/2012  . Pregnancy 04/01/2012   Home Medication(s) Prior to Admission medications   Medication Sig Start Date End Date Taking? Authorizing Provider  celecoxib (CELEBREX) 100 MG capsule Take 1 capsule (100 mg total)  by mouth 2 (two) times daily. Patient not taking: Reported on 09/24/2018 10/09/16   Wallis Bamberg, PA-C  cyclobenzaprine (FLEXERIL) 10 MG tablet Take 1 tablet (10 mg total) by mouth 2 (two) times daily as needed for muscle spasms. Patient not taking: Reported on 09/24/2018 08/15/16   Abelino Derrick, MD                                                                                                                                    Past Surgical History Past Surgical History:  Procedure Laterality Date  . CESAREAN SECTION     4 PREVIOUS  . CESAREAN SECTION WITH BILATERAL TUBAL LIGATION Right 10/18/2012   Procedure: REPEAT CESAREAN SECTION WITH BILATERAL TUBAL LIGATION;  Surgeon: Oliver Pila, MD;  Location: [facility];  Service: Obstetrics;  Laterality: Right; TOTAL OF 4 C SECTIONS  . CYSTOSCOPY WITH RETROGRADE PYELOGRAM, URETEROSCOPY AND STENT PLACEMENT Right 04/09/2013   Procedure: CYSTOSCOPY WITH STAGED URETEROSCOPY AND STENT EXCHANGE;  Surgeon: Milford Cage, MD;  Location: [facility];  Service: Urology;  Laterality: Right;  . CYSTOSCOPY/RETROGRADE/URETEROSCOPY Right 03/27/2013   Procedure: CYSTOSCOPY RIGHT URETEROSCOPY LASER LITHOTRIPSY  RIGHT RETROGRADE PYELOGRAM STENT PLACEMENT ON RIGHT;  Surgeon: Milford Cage, MD;  Location: [facility];  Service: Urology;  Laterality: Right;  . ECTOPIC PREGNANCY SURGERY  2005  . HOLMIUM LASER APPLICATION Right 03/03/2013   Procedure: HOLMIUM LASER APPLICATION;  Surgeon: Milford Cage, MD;  Location: [facility];  Service: Urology;  Laterality: Right;  . HOLMIUM LASER APPLICATION Right 03/18/2013   Procedure: HOLMIUM LASER APPLICATION;  Surgeon: Milford Cage, MD;  Location: [facility];  Service: Urology;  Laterality: Right;  . HOLMIUM LASER APPLICATION Right 03/27/2013   Procedure: HOLMIUM LASER APPLICATION;  Surgeon: Milford Cage, MD;  Location: [facility];  Service: Urology;  Laterality: Right;  . HOLMIUM LASER  APPLICATION Right 04/09/2013   Procedure: HOLMIUM LASER APPLICATION;  Surgeon: Milford Cage, MD;  Location: [facility];  Service: Urology;  Laterality: Right;  . NEPHROLITHOTOMY Right 03/03/2013   Procedure: Cystoscopy, Right Ureteroscopy with holmium laser With Strent;  Surgeon: Milford Cage, MD;  Location: [facility];  Service: Urology;  Laterality: Right;  . NEPHROLITHOTOMY Right 03/18/2013   Procedure: RIGHT NEPHROLITHOTOMY PERCUTANEOUS   (1 STAGE) WITH HOLMIUM LASER;  Surgeon: Milford Cage, MD;  Location: [facility];  Service: Urology;  Laterality: Right;  . TUBAL LIGATION     Family History Family History  Problem Relation Age of Onset  . Diabetes type II Mother   . Hypertension Mother   . Diabetes type II Sister     Social History Social History   Tobacco Use  . Smoking status: Current Some Day Smoker    Packs/day: 0.25    Years: 0.00    Pack years: 0.00    Types: Cigarettes    Last attempt to quit: 03/11/2013    Years since quitting: 6.6  . Smokeless tobacco: Never Used  . Tobacco comment: - plans to quit  Vaping Use  . Vaping Use: Never used  Substance Use Topics  . Alcohol use: Yes    Comment: wine occ   . Drug use: No   Allergies Other  Review of Systems Review of Systems All other systems are reviewed and are negative for acute change except as noted in the HPI  Physical Exam Vital Signs  I have reviewed the triage vital signs BP 128/75 (BP Location: Left Arm)   Pulse 72   Temp 98.6 F (37 C) (Oral)   Resp 15   Ht 5' (1.524 m)   Wt 80 kg   LMP 10/05/2019 Comment: Pt signed preg waiver  SpO2 100%   BMI 34.44 kg/m   Physical Exam Vitals reviewed.  Constitutional:      General: She is not in acute distress.    Appearance: She is well-developed. She is not diaphoretic.  HENT:     Head: Normocephalic and atraumatic.     Right Ear: External ear normal.     Left Ear: External ear normal.     Nose: Nose normal.    Eyes:     General: No scleral icterus.    Conjunctiva/sclera: Conjunctivae normal.  Neck:     Trachea: Phonation normal.  Cardiovascular:     Rate and Rhythm: Normal rate and regular rhythm.  Pulmonary:     Effort: Pulmonary effort is normal. No respiratory distress.     Breath sounds: No stridor.  Abdominal:  General: There is no distension.  Musculoskeletal:        General: Normal range of motion.     Cervical back: Normal range of motion.     Thoracic back: No tenderness or bony tenderness.     Lumbar back: No tenderness or bony tenderness.       Back:     Comments: TTP over the sacrum and coccyx.   Neurological:     Mental Status: She is alert and oriented to person, place, and time.     Comments: Spine Exam: Strength: 5/5 throughout LE bilaterally  Sensation: Intact to light touch in proximal and distal LE bilaterally    Psychiatric:        Behavior: Behavior normal.     ED Results and Treatments Labs (all labs ordered are listed, but only abnormal results are displayed) Labs Reviewed - No data to display                                                                                                                       EKG  EKG Interpretation  Date/Time:    Ventricular Rate:    PR Interval:    QRS Duration:   QT Interval:    QTC Calculation:   R Axis:     Text Interpretation:        Radiology DG Pelvis 1-2 Views  Result Date: 10/27/2019 CLINICAL DATA:  Slip and fall a bowling alley. Pelvic and low back pain. EXAM: PELVIS - 1-2 VIEW COMPARISON:  None. FINDINGS: The cortical margins of the bony pelvis are intact. No fracture. Pubic symphysis and sacroiliac joints are congruent. Both femoral heads are well-seated in the respective acetabula. IMPRESSION: No visualized pelvic fracture. Electronically Signed   By: Narda Rutherford M.D.   On: 10/27/2019 01:34    Pertinent labs & imaging results that were available during my care of the patient were  reviewed by me and considered in my medical decision making (see chart for details).  Medications Ordered in ED Medications  ketorolac (TORADOL) injection 30 mg (30 mg Intramuscular Given 10/27/19 0058)                                                                                                                                    Procedures Procedures  (including critical care time)  Medical Decision Making / ED Course I have reviewed the nursing notes for this  encounter and the patient's prior records (if available in EHR or on provided paperwork).   Roshawn Moises BloodM Kunka was evaluated in Emergency Department on 10/27/2019 for the symptoms described in the history of present illness. She was evaluated in the context of the global COVID-19 pandemic, which necessitated consideration that the patient might be at risk for infection with the SARS-CoV-2 virus that causes COVID-19. Institutional protocols and algorithms that pertain to the evaluation of patients at risk for COVID-19 are in a state of rapid change based on information released by regulatory bodies including the CDC and federal and state organizations. These policies and algorithms were followed during the patient's care in the ED.  Fall onto buttock. Sacral and coccyx TTP. Plain film negative No lower back pain or signs concerning for cauda equina requiring additional imaging. Provided with Ice pack and Toradol. IM     Final Clinical Impression(s) / ED Diagnoses Final diagnoses:  Fall  Injury of coccyx, initial encounter   The patient appears reasonably screened and/or stabilized for discharge and I doubt any other medical condition or other [facility] requiring further screening, evaluation, or treatment in the ED at this time prior to discharge. Safe for discharge with strict return precautions.  Disposition: Discharge  Condition: Good  I have discussed the results, Dx and Tx plan with the patient/family who expressed  understanding and agree(s) with the plan. Discharge instructions discussed at length. The patient/family was given strict return precautions who verbalized understanding of the instructions. No further questions at time of discharge.    ED Discharge Orders    None      Follow Up: Primary care provider  Schedule an appointment as soon as possible for a visit  As needed      This chart was dictated using voice recognition software.  Despite best efforts to proofread,  errors can occur which can change the documentation meaning.   Nira Connardama, Terah Robey Eduardo, MD 10/27/19 [phone]

## 2019-10-27 NOTE — Discharge Instructions (Addendum)
For pain control you may take at 1000 mg of Tylenol every 8 hours scheduled.    

## 2020-01-24 ENCOUNTER — Ambulatory Visit (HOSPITAL_COMMUNITY)
Admission: EM | Admit: 2020-01-24 | Discharge: 2020-01-24 | Disposition: A | Discharge status: Home / Self Care | Payer: Self-pay | Attending: Urgent Care | Admitting: Urgent Care

## 2020-01-24 ENCOUNTER — Encounter (HOSPITAL_COMMUNITY): Payer: Self-pay | Admitting: Emergency Medicine

## 2020-01-24 ENCOUNTER — Ambulatory Visit (INDEPENDENT_AMBULATORY_CARE_PROVIDER_SITE_OTHER): Payer: Self-pay

## 2020-01-24 ENCOUNTER — Ambulatory Visit (HOSPITAL_COMMUNITY): Payer: Self-pay

## 2020-01-24 ENCOUNTER — Other Ambulatory Visit: Payer: Self-pay

## 2020-01-24 DIAGNOSIS — M25511 Pain in right shoulder: Secondary | ICD-10-CM

## 2020-01-24 DIAGNOSIS — W19XXXA Unspecified fall, initial encounter: Secondary | ICD-10-CM

## 2020-01-24 MED ORDER — NAPROXEN 500 MG PO TABS: 500.0000 mg | ORAL_TABLET | Freq: Two times a day (BID) | ORAL | 0 refills | Status: DC

## 2020-01-24 NOTE — ED Provider Notes (Signed)
[name]   MRN: [id number] DOB: 02/13/1975  Subjective:   [name] is a 45 y.o. female presenting for 41-month history of progressively worsening right shoulder pain.  Patient states that her right shoulder was already hurting her but ended up falling 2 months ago at a bowling alley from wiping out.  She has since felt persistent and progressively worsening right shoulder pain, decreased range of motion.  Has used medications intermittently for this.  Denies bruising, swelling.  No current facility-administered medications for this encounter.  Current Outpatient Medications:  .  celecoxib (CELEBREX) 100 MG capsule, Take 1 capsule (100 mg total) by mouth 2 (two) times daily. (Patient not taking: Reported on 09/24/2018), Disp: 30 capsule, Rfl: 0 .  cyclobenzaprine (FLEXERIL) 10 MG tablet, Take 1 tablet (10 mg total) by mouth 2 (two) times daily as needed for muscle spasms. (Patient not taking: Reported on 09/24/2018), Disp: 10 tablet, Rfl: 0   Allergies  Allergen Reactions  . Other Itching and Swelling    Pt cannot eat any FRESH FRUITS OR VEGETABLES, allergic to the pesticides that are used even on organic foods. Can tolerate canned and frozen fruit and vegetables.    Past Medical History:  Diagnosis Date  . Eczema   . H/O nephrolithotomy with removal of calculi    RIGHT FLANK OPEN INCISION AREA--  CHANGE DRESSING DAILY  . History of kidney stones   . Right nephrolithiasis   . Right wrist tendonitis   . Yeast infection    current- 03/25/13- has notified Dr Margarita Grizzle. per patient --  PT STATES 04-01-2013  IMPROVING BUT STILL IRRITATED     Past Surgical History:  Procedure Laterality Date  . CESAREAN SECTION     4 PREVIOUS  . CESAREAN SECTION WITH BILATERAL TUBAL LIGATION Right 10/18/2012   Procedure: REPEAT CESAREAN SECTION WITH BILATERAL TUBAL LIGATION;  Surgeon: Oliver Pila, MD;  Location: [facility];  Service: Obstetrics;  Laterality: Right; TOTAL OF  4 C SECTIONS  . CYSTOSCOPY WITH RETROGRADE PYELOGRAM, URETEROSCOPY AND STENT PLACEMENT Right 04/09/2013   Procedure: CYSTOSCOPY WITH STAGED URETEROSCOPY AND STENT EXCHANGE;  Surgeon: Milford Cage, MD;  Location: [facility];  Service: Urology;  Laterality: Right;  . CYSTOSCOPY/RETROGRADE/URETEROSCOPY Right 03/27/2013   Procedure: CYSTOSCOPY RIGHT URETEROSCOPY LASER LITHOTRIPSY  RIGHT RETROGRADE PYELOGRAM STENT PLACEMENT ON RIGHT;  Surgeon: Milford Cage, MD;  Location: [facility];  Service: Urology;  Laterality: Right;  . ECTOPIC PREGNANCY SURGERY  2005  . HOLMIUM LASER APPLICATION Right 03/03/2013   Procedure: HOLMIUM LASER APPLICATION;  Surgeon: Milford Cage, MD;  Location: [facility];  Service: Urology;  Laterality: Right;  . HOLMIUM LASER APPLICATION Right 03/18/2013   Procedure: HOLMIUM LASER APPLICATION;  Surgeon: Milford Cage, MD;  Location: [facility];  Service: Urology;  Laterality: Right;  . HOLMIUM LASER APPLICATION Right 03/27/2013   Procedure: HOLMIUM LASER APPLICATION;  Surgeon: Milford Cage, MD;  Location: [facility];  Service: Urology;  Laterality: Right;  . HOLMIUM LASER APPLICATION Right 04/09/2013   Procedure: HOLMIUM LASER APPLICATION;  Surgeon: Milford Cage, MD;  Location: [facility];  Service: Urology;  Laterality: Right;  . NEPHROLITHOTOMY Right 03/03/2013   Procedure: Cystoscopy, Right Ureteroscopy with holmium laser With Strent;  Surgeon: Milford Cage, MD;  Location: [facility];  Service: Urology;  Laterality: Right;  . NEPHROLITHOTOMY Right 03/18/2013   Procedure: RIGHT NEPHROLITHOTOMY PERCUTANEOUS   (1 STAGE) WITH HOLMIUM LASER;  Surgeon: Reuel Boom  Gwenette Greet, MD;  Location: [facility];  Service: Urology;  Laterality: Right;  . TUBAL LIGATION      Family History  Problem Relation Age of Onset  . Diabetes type II Mother   . Hypertension Mother   . Diabetes type II Sister     Social History   Tobacco Use   . Smoking status: Current Some Day Smoker    Packs/day: 0.25    Years: 0.00    Pack years: 0.00    Types: Cigarettes    Last attempt to quit: 03/11/2013    Years since quitting: 6.8  . Smokeless tobacco: Never Used  . Tobacco comment: - plans to quit  Vaping Use  . Vaping Use: Never used  Substance Use Topics  . Alcohol use: Yes    Comment: wine occ   . Drug use: No    ROS   Objective:   Vitals: BP (!) 151/74 (BP Location: Left Arm)   Pulse 61   Temp 97.7 F (36.5 C) (Oral)   Resp 18   LMP 01/18/2020   SpO2 97%   Physical Exam Constitutional:      General: She is not in acute distress.    Appearance: Normal appearance. She is well-developed. She is not ill-appearing.  HENT:     Head: Normocephalic and atraumatic.     Nose: Nose normal.     Mouth/Throat:     Mouth: Mucous membranes are moist.     Pharynx: Oropharynx is clear.  Eyes:     General: No scleral icterus.    Extraocular Movements: Extraocular movements intact.     Pupils: Pupils are equal, round, and reactive to light.  Cardiovascular:     Rate and Rhythm: Normal rate.  Pulmonary:     Effort: Pulmonary effort is normal.  Musculoskeletal:     Comments: Right shoulder: tenderness over anterior lateral deltoids, no swelling, erythema, warmth, ecchymosis.  Decreased range of motion in all directions.  Skin:    General: Skin is warm and dry.  Neurological:     General: No focal deficit present.     Mental Status: She is alert and oriented to person, place, and time.  Psychiatric:        Mood and Affect: Mood normal.        Behavior: Behavior normal.     DG Shoulder Right  Result Date: 01/24/2020 CLINICAL DATA:  Right shoulder pain and decreased range of motion after fall EXAM: RIGHT SHOULDER - 2+ VIEW COMPARISON:  08/12/2013 FINDINGS: There is no evidence of fracture or dislocation. There is no evidence of arthropathy or other focal bone abnormality. Soft tissues are unremarkable. IMPRESSION:  Negative. Electronically Signed   By: Duanne Guess D.O.   On: 01/24/2020 18:16     Assessment and Plan :   PDMP not reviewed this encounter.  1. Acute pain of right shoulder     Recommended patient follow-up with [name] for consideration of further imaging.  In the meantime, recommended NSAIDs, shoulder rehab at home. Counseled patient on potential for adverse effects with medications prescribed/recommended today, ER and return-to-clinic precautions discussed, patient verbalized understanding.    Wallis Bamberg, PA-C 01/25/20 1007

## 2020-01-24 NOTE — ED Triage Notes (Signed)
Pt states that a couple months ago she fell at the bowling alley. Pt states that she feels a pressure on her left shoulder. Pt states that she has swelling no bruising on her shoulder, she is not able to lift it pass a certain point.

## 2021-09-30 ENCOUNTER — Emergency Department (HOSPITAL_COMMUNITY): Payer: Medicaid Other

## 2021-09-30 ENCOUNTER — Other Ambulatory Visit: Payer: Self-pay

## 2021-09-30 ENCOUNTER — Emergency Department (HOSPITAL_COMMUNITY)
Admission: EM | Admit: 2021-09-30 | Discharge: 2021-10-01 | Discharge status: Against Medical Advice | Payer: Self-pay | Attending: Emergency Medicine | Admitting: Emergency Medicine

## 2021-09-30 DIAGNOSIS — R200 Anesthesia of skin: Secondary | ICD-10-CM | POA: Insufficient documentation

## 2021-09-30 DIAGNOSIS — M25531 Pain in right wrist: Secondary | ICD-10-CM | POA: Insufficient documentation

## 2021-09-30 DIAGNOSIS — R202 Paresthesia of skin: Secondary | ICD-10-CM | POA: Insufficient documentation

## 2021-09-30 DIAGNOSIS — Z5321 Procedure and treatment not carried out due to patient leaving prior to being seen by health care provider: Secondary | ICD-10-CM | POA: Insufficient documentation

## 2021-09-30 NOTE — ED Notes (Signed)
PATIENT LEFT AMA 

## 2021-09-30 NOTE — ED Triage Notes (Signed)
Pt here for R wrist pain since yesterday. Pt denies falls/trauma. Pt has intact pulses and sensation. Pain is increased w/ movement.

## 2021-09-30 NOTE — ED Provider Triage Note (Signed)
Emergency Medicine Provider Triage Evaluation Note  [name] , a 46 y.o. female  was evaluated in triage.  Pt complains of right wrist pain worse after polishing 300 dishes at work the other day.  She also works at limit of moving boxes and states that her wrist was hurting significantly so bad that she could not grasp or hold the box.  She has a history of the same.  She denies any injury.  She has numbness and tingling in all of her fingers..  Review of Systems  Positive: Right wrist pain Negative: Neck pain  Physical Exam  BP (!) 148/84 (BP Location: Left Arm)   Pulse 64   Temp 98.2 F (36.8 C) (Oral)   Resp 16   SpO2 97%  Gen:   Awake, no distress   Resp:  Normal effort  MSK:   Moves extremities without difficulty  Other:  Cap refill less than 2 seconds  Medical Decision Making  Medically screening exam initiated at 5:29 PM.  Appropriate orders placed.  Precilla DANASIA BAKER was informed that the remainder of the evaluation will be completed by another provider, this initial triage assessment does not replace that evaluation, and the importance of remaining in the ED until their evaluation is complete.  Work-up initiated   Margarita Mail, PA-C 09/30/21 1733

## 2021-11-23 ENCOUNTER — Encounter (HOSPITAL_COMMUNITY): Payer: Self-pay

## 2021-11-23 ENCOUNTER — Ambulatory Visit (HOSPITAL_COMMUNITY)
Admission: EM | Admit: 2021-11-23 | Discharge: 2021-11-23 | Disposition: A | Discharge status: Home / Self Care | Payer: Medicaid Other | Attending: Sports Medicine | Admitting: Sports Medicine

## 2021-11-23 DIAGNOSIS — M542 Cervicalgia: Secondary | ICD-10-CM

## 2021-11-23 DIAGNOSIS — M5432 Sciatica, left side: Secondary | ICD-10-CM

## 2021-11-23 DIAGNOSIS — M5431 Sciatica, right side: Secondary | ICD-10-CM

## 2021-11-23 MED ORDER — KETOROLAC TROMETHAMINE 30 MG/ML IJ SOLN
INTRAMUSCULAR | Status: AC
  Filled 2021-11-23: qty 1

## 2021-11-23 MED ORDER — PREDNISONE 10 MG (21) PO TBPK: ORAL_TABLET | Freq: Every day | ORAL | 0 refills | Status: DC

## 2021-11-23 MED ORDER — METHYLPREDNISOLONE SODIUM SUCC 125 MG IJ SOLR
INTRAMUSCULAR | Status: AC
  Filled 2021-11-23: qty 2

## 2021-11-23 MED ORDER — KETOROLAC TROMETHAMINE 30 MG/ML IJ SOLN
30.0000 mg | Freq: Once | INTRAMUSCULAR | Status: AC
  Administered 2021-11-23: 30 mg via INTRAMUSCULAR

## 2021-11-23 MED ORDER — METHYLPREDNISOLONE SODIUM SUCC 125 MG IJ SOLR
80.0000 mg | Freq: Once | INTRAMUSCULAR | Status: AC
  Administered 2021-11-23: 80 mg via INTRAMUSCULAR

## 2021-11-23 NOTE — ED Provider Notes (Signed)
[facility]   [id number] 11/23/21 Arrival Time: 1207  ASSESSMENT & PLAN:  1. Bilateral sciatica   2. Cervicalgia    -Patient's history and exam is consistent with bilateral sciatica.  She also has some marked paraspinal musculature tenderness with cervicalgia as well.  Will treat her symptomatically today with IM injections of Solu-Medrol and Toradol.  I am hopeful that this will give her some acute symptomatic relief through the holiday.  If this returns, I gave her a prescription for prednisone taper.  I think this will be especially useful given the bilateral sciatica symptoms.  I also counseled her on the ergonomics of proper posture while working, McKenzie low back exercises, and taking over-the-counter ibuprofen as needed.  I also advised her to follow-up with a primary care physician will be helpful.  All questions were answered and she agrees to plan.  Meds ordered this encounter  Medications   methylPREDNISolone sodium succinate (SOLU-MEDROL) 125 mg/2 mL injection 80 mg   ketorolac (TORADOL) 30 MG/ML injection 30 mg   predniSONE (STERAPRED UNI-PAK 21 TAB) 10 MG (21) TBPK tablet    Sig: Take by mouth daily. Take 6 tabs by mouth daily  for 2 days, then 5 tabs for 2 days, then 4 tabs for 2 days, then 3 tabs for 2 days, 2 tabs for 2 days, then 1 tab by mouth daily for 2 days    Dispense:  42 tablet    Refill:  0   Discharge Instructions   None       Reviewed expectations re: course of current medical issues. Questions answered. Outlined signs and symptoms indicating need for more acute intervention. Patient verbalized understanding. After Visit Summary given.   SUBJECTIVE: Very pleasant 46 year old female who comes to urgent care to be evaluated for upper back pain and lower back pain and numbness and tingling in her legs.  The symptoms have been present for about 3 weeks now.  They have been acutely worsening over the last 1 week.  She describes sharp stabbing  pain at the bilateral trapezius muscles that causes radiation up to the base of the head and down the arms.  She also reports pain and of tightness in the bilateral lower back that also causes some numbness and tingling to go down the legs.  She has been working extra hours at her job where she is on her feet for long periods of time.  The symptoms are made worse by prolonged standing.  They get better when she lies flat.  She tried taking Tylenol and ibuprofen not gotten any better.  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  She denies any numbness or tingling in the groin and any issues with bowel or bladder incontinence.  Denies any fevers.  Denies any dysuria.  Patient's last menstrual period was 11/09/2021 (approximate). Past Surgical History:  Procedure Laterality Date   CESAREAN SECTION     4 PREVIOUS   CESAREAN SECTION WITH BILATERAL TUBAL LIGATION Right 10/18/2012   Procedure: REPEAT CESAREAN SECTION WITH BILATERAL TUBAL LIGATION;  Surgeon: Oliver Pila, MD;  Location: [facility];  Service: Obstetrics;  Laterality: Right; TOTAL OF 4 C SECTIONS   CYSTOSCOPY WITH RETROGRADE PYELOGRAM, URETEROSCOPY AND STENT PLACEMENT Right 04/09/2013   Procedure: CYSTOSCOPY WITH STAGED URETEROSCOPY AND STENT EXCHANGE;  Surgeon: Milford Cage, MD;  Location: [facility];  Service: Urology;  Laterality: Right;   CYSTOSCOPY/RETROGRADE/URETEROSCOPY Right  03/27/2013   Procedure: CYSTOSCOPY RIGHT URETEROSCOPY LASER LITHOTRIPSY  RIGHT RETROGRADE PYELOGRAM STENT PLACEMENT ON RIGHT;  Surgeon: Milford Cage, MD;  Location: [facility];  Service: Urology;  Laterality: Right;   ECTOPIC PREGNANCY SURGERY  2005   HOLMIUM LASER APPLICATION Right 03/03/2013   Procedure: HOLMIUM LASER APPLICATION;  Surgeon: Milford Cage, MD;  Location: [facility];  Service: Urology;  Laterality: Right;   HOLMIUM LASER APPLICATION Right 03/18/2013   Procedure:  HOLMIUM LASER APPLICATION;  Surgeon: Milford Cage, MD;  Location: [facility];  Service: Urology;  Laterality: Right;   HOLMIUM LASER APPLICATION Right 03/27/2013   Procedure: HOLMIUM LASER APPLICATION;  Surgeon: Milford Cage, MD;  Location: [facility];  Service: Urology;  Laterality: Right;   HOLMIUM LASER APPLICATION Right 04/09/2013   Procedure: HOLMIUM LASER APPLICATION;  Surgeon: Milford Cage, MD;  Location: [facility];  Service: Urology;  Laterality: Right;   NEPHROLITHOTOMY Right 03/03/2013   Procedure: Cystoscopy, Right Ureteroscopy with holmium laser With Strent;  Surgeon: Milford Cage, MD;  Location: [facility];  Service: Urology;  Laterality: Right;   NEPHROLITHOTOMY Right 03/18/2013   Procedure: RIGHT NEPHROLITHOTOMY PERCUTANEOUS   (1 STAGE) WITH HOLMIUM LASER;  Surgeon: Milford Cage, MD;  Location: [facility];  Service: Urology;  Laterality: Right;   TUBAL LIGATION       OBJECTIVE:  Vitals:   11/23/21 1232  BP: (!) 164/107  Pulse: 81  Resp: 16  Temp: 98.1 F (36.7 C)  TempSrc: Oral  SpO2: 100%     Physical Exam Vitals reviewed.  Constitutional:      Appearance: Normal appearance.     Comments: Uncomfortable appearing, leaning over exam table for support  Neck:     Comments: No tenderness to palpation at the cervical spinous processes, no bony step-off.  She is tender to palpation at the paraspinal musculature bilaterally.  She is markedly point tender over the bilateral traps which causes radiation of the pain out into the arms. Cardiovascular:     Rate and Rhythm: Normal rate.  Pulmonary:     Effort: Pulmonary effort is normal.  Musculoskeletal:     Cervical back: Normal range of motion. Spasms present. No bony tenderness. Normal range of motion.     Thoracic back: Normal.     Lumbar back: Spasms and tenderness present. No bony tenderness. Decreased range of motion. Positive right straight leg raise test and positive left  straight leg raise test.     Comments: Shoulders -full range of motion in all directions bilaterally  Lumbar spine -no obvious deformity.  Tender to palpation at the lumbar paraspinal musculature bilaterally.  Nontender to palpation at the lumbar spinous processes, no bony step-off.  Range of motion in flexion extension is limited by pain.  Negative logroll test bilaterally  Positive straight leg raise test bilaterally that induces numbness and tingling down the leg along the lateral aspect of the toes.  4/5 strength with resisted hip flexion bilaterally  Lymphadenopathy:     Cervical: No cervical adenopathy.      Labs: Results for orders placed or performed in visit on 09/03/19  Novel Coronavirus, NAA ([facility])   Specimen: Nasopharyngeal(NP) swabs in vial transport medium   Nasopharynge  Screenin  Result Value Ref Range   SARS-CoV-2, NAA Not Detected Not Detected   Labs Reviewed  POCT URINALYSIS DIPSTICK, ED / UC  POC URINE PREG, ED    Imaging: No results found.   Allergies  Allergen Reactions  Other Itching and Swelling    Pt cannot eat any FRESH FRUITS OR VEGETABLES, allergic to the pesticides that are used even on organic foods. Can tolerate canned and frozen fruit and vegetables.                                               Past Medical History:  Diagnosis Date   Eczema    H/O nephrolithotomy with removal of calculi    RIGHT FLANK OPEN INCISION AREA--  CHANGE DRESSING DAILY   History of kidney stones    Right nephrolithiasis    Right wrist tendonitis    Yeast infection    current- 03/25/13- has notified Dr Margarita Grizzle. per patient --  PT STATES 04-01-2013  IMPROVING BUT STILL IRRITATED    Social History   Socioeconomic History   Marital status: Single    Spouse name: Not on file   Number of children: 4   Years of education: Not on file   Highest education level: Some college, no degree  Occupational History   Not on file  Tobacco Use   Smoking status:  Some Days    Packs/day: 0.25    Years: 0.00    Total pack years: 0.00    Types: Cigarettes    Last attempt to quit: 03/11/2013    Years since quitting: 8.7   Smokeless tobacco: Never   Tobacco comments:    - plans to quit  Vaping Use   Vaping Use: Never used  Substance and Sexual Activity   Alcohol use: Yes    Comment: wine occ    Drug use: No   Sexual activity: Yes    Birth control/protection: Surgical  Other Topics Concern   Not on file  Social History Narrative   Not on file   Social Determinants of Health   Financial Resource Strain: Not on file  Food Insecurity: Not on file  Transportation Needs: No Transportation Needs (09/24/2018)   PRAPARE - Transportation    Lack of Transportation (Medical): No    Lack of Transportation (Non-Medical): No  Physical Activity: Not on file  Stress: Not on file  Social Connections: Not on file  Intimate Partner Violence: Not on file    Family History  Problem Relation Age of Onset   Diabetes type II Mother    Hypertension Mother    Diabetes type II Sister       Aunica Dauphinee, Baldemar Friday, MD 11/23/21 1346

## 2021-11-23 NOTE — ED Triage Notes (Signed)
Pt states pain at her shoulder blades and her lower back.  Denies any injury states she has been working a lot. Has been taking Tylenol with no relief.
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
## 2021-12-01 NOTE — Congregational Nurse Program (Signed)
Nurse met with client and her two children ([name] 04/25/2010, and [name] 10/18/12. Client here to check-in to shelter for first time. Client reports that she has chronic pain from past injuries to neck and lower back for which she takes advil and tylenol. Now that she has recently received MCD she plans to go to doctor for further treatment. Her dtr. [name] has history of Asthma and takes several medications. Her son [name] has no medical concerns.  Both children are seen by [place] [facility] (Dr. Frye). All three tested negative for COVID and were assigned a room. Plan:  Nurse will send client list of primary care providers and follow up with any medical concerns during stay here at shelter.  [name] D. Reid MSN, RN Congregational Nurse [employer] [phone] 

## 2021-12-07 ENCOUNTER — Encounter: Payer: Self-pay | Admitting: *Deleted

## 2021-12-07 NOTE — Congregational Nurse Program (Signed)
  Dept: [phone]   Congregational Nurse Program Note  Date of Encounter: 12/05/2021  Past Medical History: Past Medical History:  Diagnosis Date   Eczema    H/O nephrolithotomy with removal of calculi    RIGHT FLANK OPEN INCISION AREA--  CHANGE DRESSING DAILY   History of kidney stones    Right nephrolithiasis    Right wrist tendonitis    Yeast infection    current- 03/25/13- has notified Dr Margarita Grizzle. per patient --  PT STATES 04-01-2013  IMPROVING BUT STILL IRRITATED    Encounter Details:  CNP Questionnaire - 12/05/21 1119       Questionnaire   Ask client: Do you give verbal consent for me to treat you today? Yes    Student Assistance N/A    Location Patient Served  [employer]    Visit Setting with Client Phone/Text/Email    Patient Status Unhoused    Insurance Medicaid    Insurance/Financial Assistance Referral Medicaid    Medication N/A    Medical Provider No    Screening Referrals Made N/A    Medical Referrals Made Non-[facility]    Medical Appointment Made N/A    Recently w/o PCP, now 1st time PCP visit completed due to CNs referral or appointment made N/A    Food N/A    Transportation N/A    Housing/Utilities No permanent housing    Interpersonal Safety N/A    Interventions [facility];Advocate/Support    Abnormal to Normal Screening Since Last CN Visit N/A    Screenings CN Performed N/A    Sent Client to Lab for: N/A    Did client attend any of the following based off CNs referral or appointments made? N/A    ED Visit Averted N/A    Life-Saving Intervention Made N/A           Spoke with client over the phone related to recent referral for behavioral health services. Client is seeking mental health services for herself and family including her 19 year old son. Discussed two options of [facility] and [facility] that will take her insurance and offer therapy for family. Also explained emergency behavioral health situations and how to  get immediate help. We discussed [facility] and [facility] services. Offered to make appointments and client declined. She reports she can make appointments once she gets her schedule. Gave contact information for referrals and writer in case she needs additional assistance. Teniya Filter W RN CN

## 2021-12-13 ENCOUNTER — Telehealth: Payer: Self-pay

## 2021-12-13 NOTE — Telephone Encounter (Signed)
Nurse Waynetta Sandy phoned this nurse to report that she did speak with client and gave her referral information as requested.  Nurse Sunny Schlein will check in with client at next visit to shelter.  Lotus Santillo D. Azucena Kuba MSN, Chartered loss adjuster Nurse [employer] [phone]

## 2021-12-15 NOTE — Congregational Nurse Program (Signed)
Nurse phoned client after being notified by shelter director that client's son had been diagnosed with the flu. TC to client with no answer, nurse left a message.  Client then called nurse back.  Client explained that her son Jyel C. Troxler (10[birth year]18[birth year]2012) became ill on Saturday night, by Sunday morning he had a fever of 103 and she took him to ER where he was diagnosed with Flu and given medication to take (Tamiflu and also told to alternate Tylenol and Motrin for fever) Client reports that last temperature was 102 on Tuesday.  Client wants to remain at the father of her children's home and take care of her son and not have to leave the shelter daily at 9:00 a.m.(shelteer rule) Client hoever is afraid that she will lose her spot at the shelter if she doesn't return. Client states that she spoke with the shelter director who said she would need to get a response from this nurse.  Plan:  Nurse reinforced continuing to give meds to son as ordered, plenty of fluids along with a light diet.  Nurse texted Sport and exercise psychologist and e-mailed information on flu and that its best if they can stay separated from other shelter clients if possible due to continued fever and being contagious. Nurse did not hear back from Sport and exercise psychologist.  Client texted nurse at approx 8:15 stating that she was called by shelter FA and instructed to return with her children to shelter no later than 8:15 per the shelter director. Nurse informed client that the decision was not the nurse's and that I had advocated for her to be able to stay away until son was better. Client states that her daughter Cathlean Sauer is also sick and had to be picked up early from school. Fever of 101, stomach ache and headache. Client plans to take her to doctor tomorrow. Nurse instructed to give tylenol and fluids until then. Nurse will await a call or email back from Sport and exercise psychologist. Skye Rodarte D. Azucena Kuba MSN, Chartered loss adjuster Nurse [employer] [phone]

## 2021-12-28 ENCOUNTER — Ambulatory Visit (HOSPITAL_COMMUNITY)
Admission: EM | Admit: 2021-12-28 | Discharge: 2021-12-28 | Disposition: A | Discharge status: Home / Self Care | Payer: Medicaid Other | Attending: Emergency Medicine | Admitting: Emergency Medicine

## 2021-12-28 ENCOUNTER — Encounter (HOSPITAL_COMMUNITY): Payer: Self-pay

## 2021-12-28 DIAGNOSIS — R0602 Shortness of breath: Secondary | ICD-10-CM | POA: Insufficient documentation

## 2021-12-28 DIAGNOSIS — R509 Fever, unspecified: Secondary | ICD-10-CM | POA: Insufficient documentation

## 2021-12-28 DIAGNOSIS — J069 Acute upper respiratory infection, unspecified: Secondary | ICD-10-CM

## 2021-12-28 DIAGNOSIS — Z1152 Encounter for screening for COVID-19: Secondary | ICD-10-CM | POA: Diagnosis not present

## 2021-12-28 DIAGNOSIS — M545 Low back pain, unspecified: Secondary | ICD-10-CM | POA: Insufficient documentation

## 2021-12-28 DIAGNOSIS — R0789 Other chest pain: Secondary | ICD-10-CM | POA: Diagnosis not present

## 2021-12-28 DIAGNOSIS — R059 Cough, unspecified: Secondary | ICD-10-CM | POA: Insufficient documentation

## 2021-12-28 LAB — POC INFLUENZA A AND B ANTIGEN (URGENT CARE ONLY)
INFLUENZA A ANTIGEN, POC: NEGATIVE
INFLUENZA B ANTIGEN, POC: NEGATIVE

## 2021-12-28 MED ORDER — AZITHROMYCIN 250 MG PO TABS: 250.0000 mg | ORAL_TABLET | Freq: Every day | ORAL | 0 refills | Status: DC

## 2021-12-28 MED ORDER — PREDNISONE 10 MG (21) PO TBPK: ORAL_TABLET | Freq: Every day | ORAL | 0 refills | Status: DC

## 2021-12-28 MED ORDER — KETOROLAC TROMETHAMINE 30 MG/ML IJ SOLN
30.0000 mg | Freq: Once | INTRAMUSCULAR | Status: AC
  Administered 2021-12-28: 30 mg via INTRAMUSCULAR

## 2021-12-28 MED ORDER — KETOROLAC TROMETHAMINE 30 MG/ML IJ SOLN
INTRAMUSCULAR | Status: AC
  Filled 2021-12-28: qty 1

## 2021-12-28 MED ORDER — ATROVENT HFA 17 MCG/ACT IN AERS: 2.0000 | INHALATION_SPRAY | Freq: Two times a day (BID) | RESPIRATORY_TRACT | 12 refills | Status: DC

## 2021-12-28 NOTE — ED Provider Notes (Signed)
[facility]    CSN: [id number] Arrival date & time: 12/28/21  1351      History   Chief Complaint Chief Complaint  Patient presents with   Shortness of Breath   Back Pain    HPI [name] is a 46 y.o. female.   Patient presents for evaluation of subjective fever, chills, lower back pain, nasal congestion, rhinorrhea, cough, shortness of breath and chest tightness for 3 days.  Cough is productive with green sputum, shortness of breath is experienced at rest, worsened by exertion.  Associated chest tightness.  Exposure to flu 2 weeks ago, known sick contacts with unknown illness.  Decreased appetite but tolerating fluids.  Has attempted use of nasal spray.    Past Medical History:  Diagnosis Date   Eczema    H/O nephrolithotomy with removal of calculi    RIGHT FLANK OPEN INCISION AREA--  CHANGE DRESSING DAILY   History of kidney stones    Right nephrolithiasis    Right wrist tendonitis    Yeast infection    current- 03/25/13- has notified Dr Margarita Grizzle. 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   S/P repeat low transverse C-section 10/18/2012   H/O unilateral salpingectomy 10/18/2012   UTI (lower urinary tract infection) 04/01/2012   Renal stone 04/01/2012   Hydronephrosis 04/01/2012   Pregnancy 04/01/2012    Past Surgical History:  Procedure Laterality Date   CESAREAN SECTION     4 PREVIOUS   CESAREAN SECTION WITH BILATERAL TUBAL LIGATION Right 10/18/2012   Procedure: REPEAT CESAREAN SECTION WITH BILATERAL TUBAL LIGATION;  Surgeon: Oliver Pila, MD;  Location: [facility];  Service: Obstetrics;  Laterality: Right; TOTAL OF 4 C SECTIONS   CYSTOSCOPY WITH RETROGRADE PYELOGRAM, URETEROSCOPY AND STENT PLACEMENT Right 04/09/2013   Procedure: CYSTOSCOPY WITH STAGED URETEROSCOPY AND STENT EXCHANGE;  Surgeon: Milford Cage, MD;  Location: [facility];  Service: Urology;  Laterality: Right;   CYSTOSCOPY/RETROGRADE/URETEROSCOPY Right 03/27/2013   Procedure: CYSTOSCOPY RIGHT URETEROSCOPY LASER LITHOTRIPSY  RIGHT RETROGRADE PYELOGRAM STENT PLACEMENT ON RIGHT;  Surgeon: Milford Cage, MD;  Location: [facility];  Service: Urology;  Laterality: Right;   ECTOPIC PREGNANCY SURGERY  2005   HOLMIUM LASER APPLICATION Right 03/03/2013   Procedure: HOLMIUM LASER APPLICATION;  Surgeon: Milford Cage, MD;  Location: [facility];  Service: Urology;  Laterality: Right;   HOLMIUM LASER APPLICATION Right 03/18/2013   Procedure: HOLMIUM LASER APPLICATION;  Surgeon: Milford Cage, MD;  Location: [facility];  Service: Urology;  Laterality: Right;   HOLMIUM LASER APPLICATION Right 03/27/2013   Procedure: HOLMIUM LASER APPLICATION;  Surgeon: Milford Cage, MD;  Location: [facility];  Service: Urology;  Laterality: Right;   HOLMIUM LASER APPLICATION Right 04/09/2013   Procedure: HOLMIUM LASER APPLICATION;  Surgeon: Milford Cage, MD;  Location: [facility];  Service: Urology;  Laterality: Right;   NEPHROLITHOTOMY Right 03/03/2013   Procedure: Cystoscopy, Right Ureteroscopy with holmium laser With Strent;  Surgeon: Milford Cage, MD;  Location: [facility];  Service: Urology;  Laterality: Right;  NEPHROLITHOTOMY Right 03/18/2013   Procedure: RIGHT NEPHROLITHOTOMY PERCUTANEOUS   (1 STAGE) WITH HOLMIUM LASER;  Surgeon: Milford Cage, MD;  Location: [facility];  Service: Urology;  Laterality: Right;   TUBAL LIGATION      OB History     Gravida  5   Para  4   Term  4   Preterm  0   AB  1   Living  4       SAB  0   IAB  0   Ectopic  1   Multiple      Live Births  4            Home Medications    Prior to Admission medications   Medication Sig Start Date End Date Taking? Authorizing Provider  celecoxib (CELEBREX) 100 MG capsule Take 1 capsule (100 mg total) by mouth 2 (two) times daily. Patient not taking: No sig reported 10/09/16   Wallis Bamberg, PA-C  cyclobenzaprine (FLEXERIL) 10 MG tablet Take 1 tablet (10 mg total) by mouth 2 (two) times daily as needed for muscle spasms. Patient not taking: No sig reported 08/15/16   Mackuen, Courteney Lyn, MD  naproxen (NAPROSYN) 500 MG tablet Take 1 tablet (500 mg total) by mouth 2 (two) times daily with a meal. 01/24/20   Wallis Bamberg, PA-C  predniSONE (STERAPRED UNI-PAK 21 TAB) 10 MG (21) TBPK tablet Take by mouth daily. Take 6 tabs by mouth daily  for 2 days, then 5 tabs for 2 days, then 4 tabs for 2 days, then 3 tabs for 2 days, 2 tabs for 2 days, then 1 tab by mouth daily for 2 days 11/23/21   Rafoth, Baldemar Friday, MD    Family History Family History  Problem Relation Age of Onset   Diabetes type II Mother    Hypertension Mother    Diabetes type II Sister     Social History Social History   Tobacco Use   Smoking status: Some Days    Packs/day: 0.25    Years: 0.00    Total pack years: 0.00    Types: Cigarettes    Last attempt to quit: 03/11/2013    Years since quitting: 8.8   Smokeless tobacco: Never   Tobacco comments:    - plans to quit  Vaping Use   Vaping Use: Never used  Substance Use Topics   Alcohol use: Yes    Comment: wine occ    Drug use: No     Allergies   Other   Review of Systems Review of Systems  Constitutional:  Positive for chills and fever. Negative for activity change, appetite change, diaphoresis, fatigue and unexpected weight change.  HENT:  Positive for congestion and rhinorrhea. Negative for dental problem, drooling, ear discharge, ear pain, facial swelling, hearing loss, mouth sores,  nosebleeds, postnasal drip, sinus pressure, sinus pain, sneezing, sore throat, tinnitus, trouble swallowing and voice change.   Respiratory:  Positive for cough, chest tightness and shortness of breath. Negative for apnea, choking, wheezing and stridor.   Cardiovascular: Negative.   Gastrointestinal: Negative.   Musculoskeletal:  Positive for back pain. Negative for arthralgias, gait problem, joint swelling, myalgias, neck pain and neck stiffness.  Skin: Negative.      Physical Exam Triage Vital Signs ED Triage Vitals  Enc Vitals Group     BP 12/28/21 1701 (!) 135/92     Pulse Rate 12/28/21 1701 98     Resp 12/28/21 1701 16  Temp 12/28/21 1701 98.5 F (36.9 C)     Temp Source 12/28/21 1701 Oral     SpO2 12/28/21 1701 98 %     Weight --      Height --      Head Circumference --      Peak Flow --      Pain Score 12/28/21 1659 10     Pain Loc --      Pain Edu? --      Excl. in GC? --    No data found.  Updated Vital Signs BP (!) 135/92 (BP Location: Left Arm)   Pulse 98   Temp 98.5 F (36.9 C) (Oral)   Resp 16   SpO2 98%   Visual Acuity Right Eye Distance:   Left Eye Distance:   Bilateral Distance:    Right Eye Near:   Left Eye Near:    Bilateral Near:     Physical Exam Constitutional:      Appearance: Normal appearance.  HENT:     Right Ear: Tympanic membrane, ear canal and external ear normal.     Left Ear: Tympanic membrane, ear canal and external ear normal.     Nose: Congestion and rhinorrhea present.     Mouth/Throat:     Mouth: Mucous membranes are moist.     Pharynx: Posterior oropharyngeal erythema present.  Cardiovascular:     Rate and Rhythm: Normal rate and regular rhythm.     Pulses: Normal pulses.     Heart sounds: Normal heart sounds.  Pulmonary:     Effort: Pulmonary effort is normal.     Breath sounds: Normal breath sounds.  Skin:    General: Skin is warm and dry.  Neurological:     Mental Status: She is alert and oriented to  person, place, and time. Mental status is at baseline.  Psychiatric:        Mood and Affect: Mood normal.        Behavior: Behavior normal.      UC Treatments / Results  Labs (all labs ordered are listed, but only abnormal results are displayed) Labs Reviewed - No data to display  EKG   Radiology No results found.  Procedures Procedures (including critical care time)  Medications Ordered in UC Medications - No data to display  Initial Impression / Assessment and Plan / UC Course  I have reviewed the triage vital signs and the nursing notes.  Pertinent labs & imaging results that were available during my care of the patient were reviewed by me and considered in my medical decision making (see chart for details).  Viral URI with cough  Patient is in no signs of distress nor toxic appearing.  Vital signs are stable.  Low suspicion for pneumonia, pneumothorax or bronchitis and therefore will defer imaging.  COVID test is pending, reviewed quarantine guidelines per CDC recommendations, does not qualify for antivirals if positive, flu testing negative  Prescribed prednisone and ipratropium, watchful wait antibiotic placed at pharmacy, Z-Pak prescribed   May use additional over-the-counter medications as needed for supportive care.  May follow-up with urgent care as needed if symptoms persist or worsen.   Final Clinical Impressions(s) / UC Diagnoses   Final diagnoses:  None   Discharge Instructions   None    ED Prescriptions   None    PDMP not reviewed this encounter.   Valinda Hoar, NP 12/28/21 1758

## 2021-12-28 NOTE — ED Triage Notes (Signed)
Pt is here SOB, back pain , fever. Pt feels sharp pain in the back of right shoulder blade, facial pain and pressure in front of face x3days

## 2021-12-28 NOTE — Discharge Instructions (Addendum)
Your symptoms today are most likely being caused by a virus and should steadily improve in time it can take up to 7 to 10 days before you truly start to see a turnaround however things will get better if no improvement seen by December 31 you may begin use of azithromycin to provide coverage for bacteria  COVID test is pending, you will be notified of positive test results only if positive you will need to quarantine for an additional 2 days May return to activity on December 31, 2021, if still having symptoms at that time wear mask for 5 days  Of nasal spray every morning and every evening to help reduce sinus pressure  You have been given an injection of Toradol here today in the office to help reduce your pain, ideally you will start to see some relief in about 30  Flu test negative  Begin use of prednisone tomorrow morning as directed, this reduces inflammation throughout the body and help with your breathing as well as your back pain  You can take Tylenol and/or Ibuprofen as needed for fever reduction and pain relief.   For cough: honey 1/2 to 1 teaspoon (you can dilute the honey in water or another fluid).  You can also use guaifenesin and dextromethorphan for cough. You can use a humidifier for chest congestion and cough.  If you don't have a humidifier, you can sit in the bathroom with the hot shower running.      For sore throat: try warm salt water gargles, cepacol lozenges, throat spray, warm tea or water with lemon/honey, popsicles or ice, or OTC cold relief medicine for throat discomfort.   For congestion: take a daily anti-histamine like Zyrtec, Claritin, and a oral decongestant, such as pseudoephedrine.  You can also use Flonase 1-2 sprays in each nostril daily.   It is important to stay hydrated: drink plenty of fluids (water, gatorade/powerade/pedialyte, juices, or teas) to keep your throat moisturized and help further relieve irritation/discomfort.

## 2021-12-29 LAB — SARS CORONAVIRUS 2 (TAT 6-24 HRS): SARS Coronavirus 2: NEGATIVE

## 2022-06-29 ENCOUNTER — Ambulatory Visit
Admission: EM | Admit: 2022-06-29 | Discharge: 2022-06-29 | Disposition: A | Discharge status: Home / Self Care | Payer: Medicaid Other | Attending: Family Medicine | Admitting: Family Medicine

## 2022-06-29 DIAGNOSIS — N12 Tubulo-interstitial nephritis, not specified as acute or chronic: Secondary | ICD-10-CM | POA: Diagnosis present

## 2022-06-29 LAB — POCT URINALYSIS DIP (MANUAL ENTRY)
Bilirubin, UA: NEGATIVE
Glucose, UA: NEGATIVE mg/dL
Ketones, POC UA: NEGATIVE mg/dL
Nitrite, UA: POSITIVE — AB
Protein Ur, POC: 100 mg/dL — AB
Spec Grav, UA: 1.015 (ref 1.010–1.025)
Urobilinogen, UA: 0.2 E.U./dL
pH, UA: 7 (ref 5.0–8.0)

## 2022-06-29 LAB — POCT URINE PREGNANCY: Preg Test, Ur: NEGATIVE

## 2022-06-29 MED ORDER — CIPROFLOXACIN HCL 500 MG PO TABS: 500.0000 mg | ORAL_TABLET | Freq: Two times a day (BID) | ORAL | 0 refills | Status: AC

## 2022-06-29 MED ORDER — HYDROCODONE-ACETAMINOPHEN 5-325 MG PO TABS: 1.0000 | ORAL_TABLET | Freq: Four times a day (QID) | ORAL | 0 refills | Status: DC | PRN

## 2022-06-29 MED ORDER — KETOROLAC TROMETHAMINE 30 MG/ML IJ SOLN
30.0000 mg | Freq: Once | INTRAMUSCULAR | Status: AC
  Administered 2022-06-29: 30 mg via INTRAMUSCULAR

## 2022-06-29 NOTE — ED Triage Notes (Signed)
Patient here today with c/o LB pain that is traveling around to the groin area since last night. Has been worsening this morning. Patient has been feeling dizziness, fatigue, weakness, chills, sweats, and body aches today.

## 2022-06-29 NOTE — Discharge Instructions (Signed)
The urinalysis had white blood cells, red blood cells and nitrites.  This all could be signs of urinary infection such as a kidney infection. Urine pregnancy test was negative  You have been given a shot of Toradol 30 mg today.  Take Cipro 500 mg--1 tablet 2 times daily for 7 days; this is the antibiotic for an urine infection.  Urine culture is sent, and staff will let you know if antibiotic needs to be changed  Hydrocodone 5 mg--1 tablet every 6 hours as needed for pain.  This is best taken with food.  It can cause sleepiness or dizziness

## 2022-06-29 NOTE — ED Provider Notes (Signed)
[facility]    CSN: [id number] Arrival date & time: 06/29/22  1446      History   Chief Complaint Chief Complaint  Patient presents with   Fever    Back pain    HPI [name] is a 47 y.o. female.    Fever  Here for right-sided flank and low back pain that is radiating around to her right lower abdomen.  It began last night.  She has noted some fever also at home and here it is 101.1.  No nausea or vomiting or diarrhea.  No cough or congestion.  No sore throat.  No dysuria  Last menstrual cycle was May 28  No known allergies to medications  Past Medical History:  Diagnosis Date   Eczema    H/O nephrolithotomy with removal of calculi    RIGHT FLANK OPEN INCISION AREA--  CHANGE DRESSING DAILY   History of kidney stones    Right nephrolithiasis    Right wrist tendonitis    Yeast infection    current- 03/25/13- has notified Dr Margarita Grizzle. 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   S/P repeat low transverse C-section 10/18/2012   H/O unilateral salpingectomy 10/18/2012   UTI (lower urinary tract infection) 04/01/2012   Renal stone 04/01/2012   Hydronephrosis 04/01/2012   Pregnancy 04/01/2012    Past Surgical History:  Procedure Laterality Date   CESAREAN SECTION     4 PREVIOUS   CESAREAN SECTION WITH BILATERAL TUBAL LIGATION Right 10/18/2012   Procedure: REPEAT CESAREAN SECTION WITH BILATERAL TUBAL LIGATION;  Surgeon: Oliver Pila, MD;  Location: [facility];  Service: Obstetrics;  Laterality: Right; TOTAL OF 4 C SECTIONS   CYSTOSCOPY  WITH RETROGRADE PYELOGRAM, URETEROSCOPY AND STENT PLACEMENT Right 04/09/2013   Procedure: CYSTOSCOPY WITH STAGED URETEROSCOPY AND STENT EXCHANGE;  Surgeon: Milford Cage, MD;  Location: [facility];  Service: Urology;  Laterality: Right;   CYSTOSCOPY/RETROGRADE/URETEROSCOPY Right 03/27/2013   Procedure: CYSTOSCOPY RIGHT URETEROSCOPY LASER LITHOTRIPSY  RIGHT RETROGRADE PYELOGRAM STENT PLACEMENT ON RIGHT;  Surgeon: Milford Cage, MD;  Location: [facility];  Service: Urology;  Laterality: Right;   ECTOPIC PREGNANCY SURGERY  2005   HOLMIUM LASER APPLICATION Right 03/03/2013   Procedure: HOLMIUM LASER APPLICATION;  Surgeon: Milford Cage, MD;  Location: [facility];  Service: Urology;  Laterality: Right;   HOLMIUM LASER APPLICATION Right 03/18/2013   Procedure: HOLMIUM LASER APPLICATION;  Surgeon: Milford Cage, MD;  Location: [facility];  Service: Urology;  Laterality: Right;   HOLMIUM LASER APPLICATION Right 03/27/2013   Procedure: HOLMIUM LASER APPLICATION;  Surgeon: Milford Cage, MD;  Location: [facility];  Service: Urology;  Laterality: Right;   HOLMIUM LASER APPLICATION Right 04/09/2013   Procedure: HOLMIUM LASER APPLICATION;  Surgeon: Milford Cage, MD;  Location: [facility];  Service: Urology;  Laterality: Right;   NEPHROLITHOTOMY Right 03/03/2013   Procedure: Cystoscopy, Right Ureteroscopy with holmium laser With Strent;  Surgeon: Milford Cage, MD;  Location: [facility];  Service: Urology;  Laterality: Right;   NEPHROLITHOTOMY  Right 03/18/2013   Procedure: RIGHT NEPHROLITHOTOMY PERCUTANEOUS   (1 STAGE) WITH HOLMIUM LASER;  Surgeon: Milford Cage, MD;  Location: [facility];  Service: Urology;  Laterality: Right;   TUBAL LIGATION      OB History     Gravida  5   Para  4   Term  4   Preterm  0   AB  1   Living  4      SAB  0   IAB  0   Ectopic  1   Multiple      Live Births  4            Home Medications     Prior to Admission medications   Medication Sig Start Date End Date Taking? Authorizing Provider  ciprofloxacin (CIPRO) 500 MG tablet Take 1 tablet (500 mg total) by mouth 2 (two) times daily for 7 days. 06/29/22 07/06/22 Yes Aunya Lemler, Janace Aris, MD  HYDROcodone-acetaminophen (NORCO/VICODIN) 5-325 MG tablet Take 1 tablet by mouth every 6 (six) hours as needed (pain). 06/29/22  Yes Zenia Resides, MD    Family History Family History  Problem Relation Age of Onset   Diabetes type II Mother    Hypertension Mother    Diabetes type II Sister     Social History Social History   Tobacco Use   Smoking status: Some Days    Packs/day: 0.25    Years: 0.00    Additional pack years: 0.00    Total pack years: 0.00    Types: Cigarettes    Last attempt to quit: 03/11/2013    Years since quitting: 9.3   Smokeless tobacco: Never   Tobacco comments:    - plans to quit  Vaping Use   Vaping Use: Never used  Substance Use Topics   Alcohol use: Yes    Comment: wine occ    Drug use: No     Allergies   Other   Review of Systems Review of Systems  Constitutional:  Positive for fever.     Physical Exam Triage Vital Signs ED Triage Vitals  Enc Vitals Group     BP 06/29/22 1506 133/79     Pulse Rate 06/29/22 1506 (!) 101     Resp 06/29/22 1506 18     Temp 06/29/22 1506 (!) 101.1 F (38.4 C)     Temp Source 06/29/22 1506 Oral     SpO2 06/29/22 1506 95 %     Weight 06/29/22 1506 172 lb (78 kg)     Height 06/29/22 1506 5' (1.524 m)     Head Circumference --      Peak Flow --      Pain Score 06/29/22 1505 8     Pain Loc --      Pain Edu? --      Excl. in GC? --    No data found.  Updated Vital Signs BP 133/79 (BP Location: Left Arm)   Pulse (!) 101   Temp (!) 101.1 F (38.4 C) (Oral)   Resp 18   Ht 5' (1.524 m)   Wt 78 kg   LMP 05/30/2022 (Approximate)   SpO2 95%   BMI 33.59 kg/m   Visual Acuity Right Eye Distance:   Left Eye Distance:   Bilateral Distance:     Right Eye Near:   Left Eye Near:    Bilateral Near:     Physical Exam Vitals reviewed. Nursing note reviewed: She is in obvious discomfort  when walking.. Constitutional:      General: She is not in acute distress.    Appearance: She is not ill-appearing, toxic-appearing or diaphoretic.  HENT:     Mouth/Throat:     Mouth: Mucous membranes are moist.  Eyes:     Extraocular Movements: Extraocular movements intact.     Conjunctiva/sclera: Conjunctivae normal.     Pupils: Pupils are equal, round, and reactive to light.  Cardiovascular:     Rate and Rhythm: Normal rate and regular rhythm.     Heart sounds: No murmur heard. Pulmonary:     Effort: Pulmonary effort is normal.     Breath sounds: Normal breath sounds.  Abdominal:     General: There is no distension.     Palpations: Abdomen is soft. There is no mass.     Tenderness: There is no abdominal tenderness. There is no guarding.     Comments: Possible mild right CVA tenderness  Musculoskeletal:     Cervical back: Neck supple.  Lymphadenopathy:     Cervical: No cervical adenopathy.  Skin:    Coloration: Skin is not jaundiced or pale.  Neurological:     General: No focal deficit present.     Mental Status: She is alert and oriented to person, place, and time.  Psychiatric:        Behavior: Behavior normal.      UC Treatments / Results  Labs (all labs ordered are listed, but only abnormal results are displayed) Labs Reviewed  POCT URINALYSIS DIP (MANUAL ENTRY) - Abnormal; Notable for the following components:      Result Value   Clarity, UA cloudy (*)    Blood, UA small (*)    Protein Ur, POC =100 (*)    Nitrite, UA Positive (*)    Leukocytes, UA Large (3+) (*)    All other components within normal limits  URINE CULTURE  POCT URINE PREGNANCY    EKG   Radiology No results found.  Procedures Procedures (including critical care time)  Medications Ordered in UC Medications  ketorolac (TORADOL) 30 MG/ML  injection 30 mg (has no administration in time range)    Initial Impression / Assessment and Plan / UC Course  I have reviewed the triage vital signs and the nursing notes.  Pertinent labs & imaging results that were available during my care of the patient were reviewed by me and considered in my medical decision making (see chart for details).        Urinalysis shows large amount of white cells and nitrites and also small amount of red blood cells.  UPT is negative  I am concerned she has Pyelo with her location of her pain, and with the fever.  Cipro sent in to treat urine culture is sent.  Toradol is given here and then hydrocodone is sent in to treat her pain. Final Clinical Impressions(s) / UC Diagnoses   Final diagnoses:  Pyelonephritis     Discharge Instructions      The urinalysis had white blood cells, red blood cells and nitrites.  This all could be signs of urinary infection such as a kidney infection. Urine pregnancy test was negative  You have been given a shot of Toradol 30 mg today.  Take Cipro 500 mg--1 tablet 2 times daily for 7 days; this is the antibiotic for an urine infection.  Urine culture is sent, and staff will let you know if antibiotic needs to be changed  Hydrocodone 5 mg--1 tablet every 6  hours as needed for pain.  This is best taken with food.  It can cause sleepiness or dizziness      ED Prescriptions     Medication Sig Dispense Auth. Provider   ciprofloxacin (CIPRO) 500 MG tablet Take 1 tablet (500 mg total) by mouth 2 (two) times daily for 7 days. 14 tablet Natasia Sanko, Janace Aris, MD   HYDROcodone-acetaminophen (NORCO/VICODIN) 5-325 MG tablet Take 1 tablet by mouth every 6 (six) hours as needed (pain). 12 tablet Demorio Seeley, Janace Aris, MD      I have reviewed the PDMP during this encounter.   Zenia Resides, MD 06/29/22 [phone]

## 2022-06-30 LAB — URINE CULTURE: Culture: >=100000 — AB

## 2022-07-01 LAB — URINE CULTURE

## 2022-11-20 NOTE — Progress Notes (Signed)
Office Visit Note  Patient: [name]             Date of Birth: 1975/06/25           MRN: [id number]             PCP: [facility] Referring: Rosemary Holms Visit Date: 11/21/2022 Occupation: caterer  Subjective:  New Patient (Initial Visit) (Patient states she has joint pain around her hips, groin area, fingers, hands, wrists, and elbows. )    Discussed the use of AI scribe software for clinical note transcription with the patient, who gave verbal consent to proceed.  History of Present Illness   [name] is a 47 y.o. female here for joint pain in the hands, noticeable for a couple of years but worse during the past year. They describe the pain as hurting when touched or pressed, with morning stiffness and swelling. The patient also reports changes in the appearance of their hands, with deviation of the fingers and development of nodules at the distal joints. The swelling is thought to be related to dietary intake, particularly high salt foods, and is also noticed in the feet and ankles.  She also has a history of back pain for which she has taken NSAIDs and tramadol previously.  The patient also reports frequent numbness in the hands, described as 'falling asleep,' which is particularly noticeable in the morning and during activities such as doing their daughter's hair. The numbness is relieved by dropping the hands and allowing them to hang down. The patient has a history of wrist strain from previous work as a [employer], but denies any numbness in the feet or toes.  There is a family history of arthritis, with the patient's mother having similar hand changes. The patient has not had any recent imaging of the hands.   Labs reviewed 05/2022 ANA neg RF neg CCP neg ESR 5 Uric acid 5.3  Activities of Daily Living:  Patient reports morning stiffness for 1 hour.   Patient Reports nocturnal pain.  Difficulty dressing/grooming: Denies Difficulty  climbing stairs: Reports Difficulty getting out of chair: Reports Difficulty using hands for taps, buttons, cutlery, and/or writing: Reports  Review of Systems  Constitutional:  Positive for fatigue.  HENT:  Negative for mouth sores and mouth dryness.   Eyes:  Negative for dryness.  Respiratory:  Positive for shortness of breath.   Cardiovascular:  Negative for chest pain and palpitations.  Gastrointestinal:  Negative for blood in stool, constipation and diarrhea.  Endocrine: Positive for increased urination.  Genitourinary:  Negative for involuntary urination.  Musculoskeletal:  Positive for joint pain, gait problem, joint pain, joint swelling, myalgias, morning stiffness, muscle tenderness and myalgias. Negative for muscle weakness.  Skin:  Negative for color change, rash, hair loss and sensitivity to sunlight.  Allergic/Immunologic: Negative for susceptible to infections.  Neurological:  Negative for dizziness and headaches.  Hematological:  Negative for swollen glands.  Psychiatric/Behavioral:  Negative for depressed mood and sleep disturbance. The patient is not nervous/anxious.     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   Staghorn renal calculus  03/03/2013   S/P repeat low transverse C-section 10/18/2012   H/O unilateral salpingectomy 10/18/2012   Lower urinary tract infectious disease 04/01/2012   Renal stone 04/01/2012   Hydronephrosis 04/01/2012   Pregnancy 04/01/2012    Past Medical History:  Diagnosis Date   Arthritis    neck   Eczema    H/O nephrolithotomy with removal of calculi    RIGHT FLANK OPEN INCISION AREA--   CHANGE DRESSING DAILY   Herniated lumbar intervertebral disc    History of kidney stones    Right nephrolithiasis    Right wrist tendonitis    Yeast infection    current- 03/25/13- has notified Dr Margarita Grizzle. per patient --  PT STATES 04-01-2013  IMPROVING BUT STILL IRRITATED    Family History  Problem Relation Age of Onset   Diabetes type II Mother    Hypertension Mother    Diabetes type II Sister    Past Surgical History:  Procedure Laterality Date   CESAREAN SECTION     4 PREVIOUS   CESAREAN SECTION WITH BILATERAL TUBAL LIGATION Right 10/18/2012   Procedure: REPEAT CESAREAN SECTION WITH BILATERAL TUBAL LIGATION;  Surgeon: Oliver Pila, MD;  Location: [facility];  Service: Obstetrics;  Laterality: Right; TOTAL OF 4 C SECTIONS   CYSTOSCOPY WITH RETROGRADE PYELOGRAM, URETEROSCOPY AND STENT PLACEMENT Right 04/09/2013   Procedure: CYSTOSCOPY WITH STAGED URETEROSCOPY AND STENT EXCHANGE;  Surgeon: Milford Cage, MD;  Location: [facility];  Service: Urology;  Laterality: Right;   CYSTOSCOPY/RETROGRADE/URETEROSCOPY Right 03/27/2013   Procedure: CYSTOSCOPY RIGHT URETEROSCOPY LASER LITHOTRIPSY  RIGHT RETROGRADE PYELOGRAM STENT PLACEMENT ON RIGHT;  Surgeon: Milford Cage, MD;  Location: [facility];  Service: Urology;  Laterality: Right;   ECTOPIC PREGNANCY SURGERY  2005   HOLMIUM LASER APPLICATION Right 03/03/2013   Procedure: HOLMIUM LASER APPLICATION;  Surgeon: Milford Cage, MD;  Location: [facility];  Service: Urology;  Laterality: Right;   HOLMIUM LASER APPLICATION Right 03/18/2013   Procedure: HOLMIUM LASER APPLICATION;  Surgeon: Milford Cage, MD;  Location: [facility];  Service: Urology;  Laterality: Right;   HOLMIUM LASER APPLICATION Right 03/27/2013   Procedure: HOLMIUM LASER APPLICATION;  Surgeon: Milford Cage, MD;  Location: [facility];  Service: Urology;  Laterality: Right;   HOLMIUM LASER APPLICATION Right 04/09/2013   Procedure: HOLMIUM LASER  APPLICATION;  Surgeon: Milford Cage, MD;  Location: [facility];  Service: Urology;  Laterality: Right;   NEPHROLITHOTOMY Right 03/03/2013   Procedure: Cystoscopy, Right Ureteroscopy with holmium laser With Strent;  Surgeon: Milford Cage, MD;  Location: [facility];  Service: Urology;  Laterality: Right;   NEPHROLITHOTOMY Right 03/18/2013   Procedure: RIGHT NEPHROLITHOTOMY PERCUTANEOUS   (1 STAGE) WITH HOLMIUM LASER;  Surgeon: Milford Cage, MD;  Location: [facility];  Service: Urology;  Laterality: Right;   TUBAL LIGATION     Social History   Social History Narrative   Not on file   There is no immunization history for the selected administration types on file for this patient.   Objective: Vital Signs: BP 136/86 (BP Location: Right Arm, Patient Position: Sitting, Cuff Size: Normal)   Pulse 67   Ht 5' (1.524 m)   Wt 171 lb (77.6 kg)   LMP 10/21/2022   BMI 33.40 kg/m    Physical Exam Eyes:     Conjunctiva/sclera: Conjunctivae normal.  Cardiovascular:     Rate and Rhythm: Normal rate and regular rhythm.  Pulmonary:     Effort:  Pulmonary effort is normal.     Breath sounds: Normal breath sounds.  Lymphadenopathy:     Cervical: No cervical adenopathy.  Skin:    General: Skin is warm and dry.  Neurological:     Mental Status: She is alert.  Psychiatric:        Mood and Affect: Mood normal.      Musculoskeletal Exam:  Shoulders full ROM no tenderness or swelling Elbows full ROM no tenderness or swelling Wrists full ROM right wrist positive Tinel sign with no palpable swelling Fingers with swan-neck deformity reversible PIP hyperextension, DIP Heberden's nodes and lateral joint deviation Knees full ROM no tenderness or swelling    Investigation: No additional findings.  Imaging: XR Hand 2 View Right  Result Date: 11/21/2022 X-ray right hand 2 views Radiocarpal and carpal joints appear normal.  MCP joints appear normal.  PIP joint is  normal.  Asymmetric joint space narrowing with lateral deviation and osteophytes at DIP joints most advanced second digit.  No erosions or abnormal calcifications seen.  Bone realization appears normal. Impression DIP joint osteoarthritis  XR Hand 2 View Left  Result Date: 11/21/2022 X-ray left hand 2 views Radiocarpal carpal joints appear normal.  MCP joints appear normal.  Marginal calcification at first IP joint.  PIPs appear normal.  DIP joints slight lateral shifting without osteophytes.  No erosions or abnormal calcifications seen.  Bone realization appears normal. Impression Very mild appearing DIP osteoarthritis   Recent Labs: Lab Results  Component Value Date   WBC 9.5 08/15/2016   HGB 12.5 08/15/2016   PLT 397 08/15/2016   NA 136 08/15/2016   K 3.9 08/15/2016   CL 107 08/15/2016   CO2 20 (L) 08/15/2016   GLUCOSE 119 (H) 08/15/2016   BUN 11 08/15/2016   CREATININE 0.72 08/15/2016   BILITOT 0.3 11/20/2014   ALKPHOS 74 11/20/2014   AST 13 11/20/2014   ALT 9 11/20/2014   PROT 7.2 11/20/2014   ALBUMIN 4.4 11/20/2014   CALCIUM 9.0 08/15/2016   GFRAA >60 08/15/2016    Speciality Comments: No specialty comments available.  Procedures:  No procedures performed Allergies: Other   Assessment / Plan:     Assessment and Plan Bilateral hand osteoarthritis Chronic joint pain and deformity in their hands.  Does have reported intermittent swelling and prolonged morning stiffness and family history of arthritis.  There is no peripheral synovitis appreciable on exam.  X-ray of bilateral hands checked in clinic today shows just distal joint degenerative changes.  Discussed symptomatic management for osteoarthritis.  Could consider referral to Occupational Therapy for symptom management and might benefit with supporting braces to reverse PIP hyperextension.  Possible Carpal Tunnel Syndrome They report bilateral hand numbness, tingling, and weakness, especially with repetitive wrist  movements and in the morning, without prior diagnostic testing for carpal tunnel syndrome. We will refer them for a nerve conduction study to assess for possible carpal tunnel syndrome.  Swelling in Hands and Feet They report swelling in their hands and feet, particularly in the morning, with a possible dietary influence. We advise them to monitor their salt intake and observe any correlation with the swelling.  Orders: Orders Placed This Encounter  Procedures   XR Hand 2 View Right   XR Hand 2 View Left   No orders of the defined types were placed in this encounter.   Follow-Up Instructions: No follow-ups on file.   Fuller Plan, MD  Note - This record has been created using Dragon  software.  Chart creation errors have been sought, but may not always  have been located. Such creation errors do not reflect on  the standard of medical care.

## 2022-11-21 ENCOUNTER — Ambulatory Visit: Payer: Medicaid Other | Attending: Internal Medicine | Admitting: Internal Medicine

## 2022-11-21 ENCOUNTER — Ambulatory Visit: Payer: Medicaid Other

## 2022-11-21 ENCOUNTER — Encounter: Payer: Self-pay | Admitting: Internal Medicine

## 2022-11-21 VITALS — BP 136/86 | HR 67 | Ht 60.0 in | Wt 171.0 lb

## 2022-11-21 DIAGNOSIS — M79642 Pain in left hand: Secondary | ICD-10-CM

## 2022-11-21 DIAGNOSIS — M79641 Pain in right hand: Secondary | ICD-10-CM

## 2022-11-21 NOTE — Patient Instructions (Signed)

## 2022-11-27 ENCOUNTER — Other Ambulatory Visit: Payer: Self-pay | Admitting: *Deleted

## 2022-11-27 DIAGNOSIS — M79642 Pain in left hand: Secondary | ICD-10-CM

## 2022-12-12 ENCOUNTER — Encounter: Payer: Medicaid Other | Admitting: Physical Medicine and Rehabilitation

## 2023-04-16 ENCOUNTER — Other Ambulatory Visit: Payer: Self-pay

## 2023-04-16 ENCOUNTER — Ambulatory Visit
Admission: EM | Admit: 2023-04-16 | Discharge: 2023-04-16 | Disposition: A | Discharge status: Home / Self Care | Attending: Family Medicine | Admitting: Family Medicine

## 2023-04-16 ENCOUNTER — Encounter: Payer: Self-pay | Admitting: *Deleted

## 2023-04-16 DIAGNOSIS — M5412 Radiculopathy, cervical region: Secondary | ICD-10-CM

## 2023-04-16 DIAGNOSIS — M5442 Lumbago with sciatica, left side: Secondary | ICD-10-CM

## 2023-04-16 DIAGNOSIS — M5441 Lumbago with sciatica, right side: Secondary | ICD-10-CM

## 2023-04-16 DIAGNOSIS — G8929 Other chronic pain: Secondary | ICD-10-CM

## 2023-04-16 MED ORDER — TIZANIDINE HCL 4 MG PO TABS: 4.0000 mg | ORAL_TABLET | Freq: Every evening | ORAL | 0 refills | Status: DC | PRN

## 2023-04-16 MED ORDER — KETOROLAC TROMETHAMINE 30 MG/ML IJ SOLN
30.0000 mg | Freq: Once | INTRAMUSCULAR | Status: AC
  Administered 2023-04-16: 30 mg via INTRAMUSCULAR

## 2023-04-16 MED ORDER — PREDNISONE 20 MG PO TABS: 20.0000 mg | ORAL_TABLET | Freq: Two times a day (BID) | ORAL | 0 refills | Status: AC

## 2023-04-16 MED ORDER — DEXAMETHASONE SODIUM PHOSPHATE 10 MG/ML IJ SOLN
10.0000 mg | Freq: Once | INTRAMUSCULAR | Status: AC
  Administered 2023-04-16: 10 mg via INTRAMUSCULAR

## 2023-04-16 NOTE — ED Provider Notes (Signed)
 [facility]    CSN: [id number] Arrival date & time: 04/16/23  1659      History   Chief Complaint Chief Complaint  Patient presents with   Back Pain    HPI [name] is a 48 y.o. female.    Back Pain Chronic neck and mid to low back pain by [facility]  Past Medical History:  Diagnosis Date   Arthritis    neck   Eczema    H/O nephrolithotomy with removal of calculi    RIGHT FLANK OPEN INCISION AREA--  CHANGE DRESSING DAILY   Herniated lumbar intervertebral disc    History of kidney stones    Right nephrolithiasis    Right wrist tendonitis    Yeast infection    current- 03/25/13- has notified Dr Gordy Lauber. per patient --  PT STATES 04-01-2013  IMPROVING BUT STILL IRRITATED    Patient Active Problem List   Diagnosis Date Noted   Bilateral hand pain 11/21/2022   Screening breast examination 09/24/2018   Breast pain, left 09/24/2018   Lower back injury 11/23/2014   Sacral back pain 11/20/2014   Leukocytosis 11/20/2014   History of kidney stones 11/20/2014   Obesity 11/20/2014   Staghorn calculus 03/27/2013   Staghorn kidney stones 03/18/2013   Staghorn renal calculus 03/03/2013   S/P repeat low transverse C-section 10/18/2012   H/O unilateral salpingectomy 10/18/2012   Lower urinary tract infectious disease 04/01/2012   Renal stone 04/01/2012   Hydronephrosis 04/01/2012   Pregnancy 04/01/2012    Past Surgical History:  Procedure Laterality Date   CESAREAN SECTION     4 PREVIOUS   CESAREAN SECTION WITH BILATERAL TUBAL LIGATION Right 10/18/2012   Procedure: REPEAT CESAREAN SECTION WITH BILATERAL TUBAL LIGATION;  Surgeon: Adrianna Albee, MD;  Location: [facility];  Service: Obstetrics;  Laterality: Right; TOTAL OF 4 C SECTIONS   CYSTOSCOPY WITH RETROGRADE PYELOGRAM, URETEROSCOPY AND STENT PLACEMENT Right 04/09/2013   Procedure: CYSTOSCOPY WITH STAGED URETEROSCOPY AND STENT EXCHANGE;  Surgeon: Soledad Dupes, MD;  [facility];  Service: Urology;  Laterality: Right;   CYSTOSCOPY/RETROGRADE/URETEROSCOPY Right 03/27/2013   Procedure: CYSTOSCOPY RIGHT URETEROSCOPY LASER LITHOTRIPSY  RIGHT RETROGRADE PYELOGRAM STENT PLACEMENT ON RIGHT;  Surgeon: Soledad Dupes, MD;  Location: [facility];  Service: Urology;  Laterality: Right;   ECTOPIC PREGNANCY SURGERY  2005   HOLMIUM LASER APPLICATION Right 03/03/2013   Procedure: HOLMIUM LASER APPLICATION;  Surgeon: Soledad Dupes, MD;  Location: [facility];  Service: Urology;  Laterality: Right;   HOLMIUM LASER APPLICATION Right 03/18/2013   Procedure: HOLMIUM LASER APPLICATION;  Surgeon: Soledad Dupes, MD;  Location: [facility];  Service: Urology;  Laterality: Right;   HOLMIUM LASER APPLICATION Right 03/27/2013   Procedure: HOLMIUM LASER APPLICATION;  Surgeon: Soledad Dupes, MD;  Location: [facility];  Service: Urology;  Laterality: Right;   HOLMIUM LASER APPLICATION Right 04/09/2013   Procedure: HOLMIUM LASER APPLICATION;  Surgeon: Soledad Dupes, MD;  Location: [facility];  Service: Urology;  Laterality: Right;   NEPHROLITHOTOMY Right 03/03/2013   Procedure: Cystoscopy, Right Ureteroscopy with holmium laser With Strent;  Surgeon: Soledad Dupes, MD;  Location: [facility];  Service: Urology;  Laterality: Right;   NEPHROLITHOTOMY Right 03/18/2013   Procedure: RIGHT NEPHROLITHOTOMY PERCUTANEOUS   (1 STAGE) WITH HOLMIUM LASER;  Surgeon: Soledad Dupes, MD;  Location: [facility];  Service: Urology;  Laterality: Right;   TUBAL LIGATION      OB History  Gravida  5   Para  4   Term  4   Preterm  0   AB  1   Living  4      SAB  0   IAB  0   Ectopic  1   Multiple      Live Births  4            Home Medications    Prior to Admission medications   Medication Sig Start Date End Date Taking? Authorizing Provider  HYDROcodone-acetaminophen (NORCO/VICODIN) 5-325 MG tablet Take 1 tablet by mouth every 6 (six) hours  as needed (pain). Patient not taking: Reported on 11/21/2022 06/29/22   Ann Keto, MD    Family History Family History  Problem Relation Age of Onset   Diabetes type II Mother    Hypertension Mother    Diabetes type II Sister     Social History Social History   Tobacco Use   Smoking status: Some Days    Current packs/day: 0.00    Types: Cigarettes    Start date: 03/11/2013    Last attempt to quit: 03/11/2013    Years since quitting: 10.1   Smokeless tobacco: Never   Tobacco comments:    - plans to quit  Vaping Use   Vaping status: Never Used  Substance Use Topics   Alcohol use: Yes    Comment: wine occ    Drug use: No     Allergies   Other   Review of Systems Review of Systems  Musculoskeletal:  Positive for back pain.     Physical Exam Triage Vital Signs ED Triage Vitals  Encounter Vitals Group     BP 04/16/23 1739 (!) 158/91     Systolic BP Percentile --      Diastolic BP Percentile --      Pulse Rate 04/16/23 1739 64     Resp 04/16/23 1739 18     Temp 04/16/23 1739 98.4 F (36.9 C)     Temp Source 04/16/23 1739 Oral     SpO2 04/16/23 1739 98 %     Weight --      Height --      Head Circumference --      Peak Flow --      Pain Score 04/16/23 1736 10     Pain Loc --      Pain Education --      Exclude from Growth Chart --    No data found.  Updated Vital Signs BP (!) 158/91 (BP Location: Left Arm)   Pulse 64   Temp 98.4 F (36.9 C) (Oral)   Resp 18   LMP 03/29/2023 (Approximate)   SpO2 98%   Visual Acuity Right Eye Distance:   Left Eye Distance:   Bilateral Distance:    Right Eye Near:   Left Eye Near:    Bilateral Near:     Physical Exam   UC Treatments / Results  Labs (all labs ordered are listed, but only abnormal results are displayed) Labs Reviewed - No data to display  EKG   Radiology No results found.  Procedures Procedures (including critical care time)  Medications Ordered in UC Medications - No  data to display  Initial Impression / Assessment and Plan / UC Course  I have reviewed the triage vital signs and the nursing notes.  Pertinent labs & imaging results that were available during my care of the patient were reviewed by me and considered  in my medical decision making (see chart for details).     *** Final Clinical Impressions(s) / UC Diagnoses   Final diagnoses:  None   Discharge Instructions   None    ED Prescriptions   None    PDMP not reviewed this encounter.

## 2023-04-16 NOTE — ED Triage Notes (Signed)
 Pt has hx of chronic back pain- Followed by [facility]. States she works for a [employer] and thinks she may have "over done it". Pt reports "tingling" in her upper back and around her lower back. Pain flare up x 1 week, worse for the last 2 days. Taking advil and using heat/ice.

## 2023-04-17 NOTE — ED Provider Notes (Incomplete)
 [facility]    CSN: [id number] Arrival date & time: 04/16/23  1659      History   Chief Complaint Chief Complaint  Patient presents with  . Back Pain    HPI [name] is a 48 y.o. female.    Back Pain Chronic neck and mid to low back pain by [facility].  Has not been seen in over one year by [employer]. Current pain is lower cervical spine radiating into mid thoracic spine to mid lower lumbar. Patient works as Firefighter and feel that she recently may have over exerted herself. Current pain present x 1 week and symptoms are worsening.   Past Medical History:  Diagnosis Date  . Arthritis    neck  . Eczema   . H/O nephrolithotomy with removal of calculi    RIGHT FLANK OPEN INCISION AREA--  CHANGE DRESSING DAILY  . Herniated lumbar intervertebral disc   . History of kidney stones   . Right nephrolithiasis   . Right wrist tendonitis   . Yeast infection    current- 03/25/13- has notified Dr Gordy Lauber. per patient --  PT STATES 04-01-2013  IMPROVING BUT STILL IRRITATED    Patient Active Problem List   Diagnosis Date Noted  . Bilateral hand pain 11/21/2022  . Screening breast examination 09/24/2018  . Breast pain, left 09/24/2018  . Lower back injury 11/23/2014  . Sacral back pain 11/20/2014  . Leukocytosis 11/20/2014  . History of kidney stones 11/20/2014  . Obesity 11/20/2014  . 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 S/P repeat low transverse C-section 10/18/2012  . H/O unilateral salpingectomy 10/18/2012  . Lower urinary tract infectious disease 04/01/2012  . Renal stone 04/01/2012  . Hydronephrosis 04/01/2012  . Pregnancy 04/01/2012    Past Surgical History:  Procedure Laterality Date  . CESAREAN SECTION     4 PREVIOUS  . CESAREAN SECTION WITH BILATERAL TUBAL LIGATION Right 10/18/2012   Procedure: REPEAT CESAREAN SECTION WITH BILATERAL TUBAL LIGATION;   Surgeon: Adrianna Albee, MD;  Location: [facility];  Service: Obstetrics;  Laterality: Right; TOTAL OF 4 C SECTIONS  . CYSTOSCOPY WITH RETROGRADE PYELOGRAM, URETEROSCOPY AND STENT PLACEMENT Right 04/09/2013   Procedure: CYSTOSCOPY WITH STAGED URETEROSCOPY AND STENT EXCHANGE;  Surgeon: Soledad Dupes, MD;  Location: [facility];  Service: Urology;  Laterality: Right;  . CYSTOSCOPY/RETROGRADE/URETEROSCOPY Right 03/27/2013   Procedure: CYSTOSCOPY RIGHT URETEROSCOPY LASER LITHOTRIPSY  RIGHT RETROGRADE PYELOGRAM STENT PLACEMENT ON RIGHT;  Surgeon: Soledad Dupes, MD;  Location: [facility];  Service: Urology;  Laterality: Right;  . ECTOPIC PREGNANCY SURGERY  2005  . HOLMIUM LASER APPLICATION Right 03/03/2013   Procedure: HOLMIUM LASER APPLICATION;  Surgeon: Soledad Dupes, MD;  Location: [facility];  Service: Urology;  Laterality: Right;  . HOLMIUM LASER APPLICATION Right 03/18/2013   Procedure: HOLMIUM LASER APPLICATION;  Surgeon: Soledad Dupes, MD;  Location: [facility];  Service: Urology;  Laterality: Right;  . HOLMIUM LASER APPLICATION Right 03/27/2013   Procedure: HOLMIUM LASER APPLICATION;  Surgeon: Soledad Dupes, MD;  Location: [facility];  Service: Urology;  Laterality: Right;  . HOLMIUM LASER APPLICATION Right 04/09/2013   Procedure: HOLMIUM LASER APPLICATION;  Surgeon: Soledad Dupes, MD;  Location: [facility];  Service: Urology;  Laterality: Right;  . NEPHROLITHOTOMY Right 03/03/2013   Procedure: Cystoscopy, Right Ureteroscopy with holmium laser With Strent;  Surgeon: Soledad Dupes, MD;  Location:  [facility];  Service: Urology;  Laterality: Right;  . NEPHROLITHOTOMY Right 03/18/2013   Procedure: RIGHT NEPHROLITHOTOMY PERCUTANEOUS   (1 STAGE) WITH HOLMIUM LASER;  Surgeon: Milford Cage, MD;  Location: [facility];  Service: Urology;  Laterality: Right;  . TUBAL LIGATION      OB History     Gravida  5   Para  4   Term  4   Preterm   0   AB  1   Living  4      SAB  0   IAB  0   Ectopic  1   Multiple      Live Births  4            Home Medications    Prior to Admission medications   Medication Sig Start Date End Date Taking? Authorizing Provider  predniSONE (DELTASONE) 20 MG tablet Take 1 tablet (20 mg total) by mouth 2 (two) times daily with a meal for 5 days. 04/16/23 04/21/23 Yes Bing Neighbors, NP  tiZANidine (ZANAFLEX) 4 MG tablet Take 1 tablet (4 mg total) by mouth at bedtime as needed for muscle spasms. 04/16/23  Yes Bing Neighbors, NP  HYDROcodone-acetaminophen (NORCO/VICODIN) 5-325 MG tablet Take 1 tablet by mouth every 6 (six) hours as needed (pain). Patient not taking: Reported on 11/21/2022 06/29/22   Zenia Resides, MD    Family History Family History  Problem Relation Age of Onset  . Diabetes type II Mother   . Hypertension Mother   . Diabetes type II Sister     Social History Social History   Tobacco Use  . Smoking status: Some Days    Current packs/day: 0.00    Types: Cigarettes    Start date: 03/11/2013    Last attempt to quit: 03/11/2013    Years since quitting: 10.1  . Smokeless tobacco: Never  . Tobacco comments:    - plans to quit  Vaping Use  . Vaping status: Never Used  Substance Use Topics  . Alcohol use: Yes    Comment: wine occ   . Drug use: No     Allergies   Other   Review of Systems Review of Systems  Musculoskeletal:  Positive for back pain.     Physical Exam Triage Vital Signs ED Triage Vitals  Encounter Vitals Group     BP 04/16/23 1739 (!) 158/91     Systolic BP Percentile --      Diastolic BP Percentile --      Pulse Rate 04/16/23 1739 64     Resp 04/16/23 1739 18     Temp 04/16/23 1739 98.4 F (36.9 C)     Temp Source 04/16/23 1739 Oral     SpO2 04/16/23 1739 98 %     Weight --      Height --      Head Circumference --      Peak Flow --      Pain Score 04/16/23 1736 10     Pain Loc --      Pain Education --       Exclude from Growth Chart --    No data found.  Updated Vital Signs BP (!) 158/91 (BP Location: Left Arm)   Pulse 64   Temp 98.4 F (36.9 C) (Oral)   Resp 18   LMP 03/29/2023 (Approximate)   SpO2 98%   Visual Acuity Right Eye Distance:   Left Eye Distance:   Bilateral Distance:  Right Eye Near:   Left Eye Near:    Bilateral Near:     Physical Exam Vitals reviewed.  Constitutional:      Appearance: Normal appearance.  HENT:     Head: Normocephalic and atraumatic.  Cardiovascular:     Rate and Rhythm: Normal rate and regular rhythm.  Pulmonary:     Effort: Pulmonary effort is normal.     Breath sounds: Normal breath sounds.  Musculoskeletal:     Cervical back: Bony tenderness present. Pain with movement present. Decreased range of motion.     Thoracic back: Tenderness present.     Lumbar back: Tenderness present.  Neurological:     Mental Status: She is alert.      UC Treatments / Results  Labs (all labs ordered are listed, but only abnormal results are displayed) Labs Reviewed - No data to display  EKG   Radiology No results found.  Procedures Procedures (including critical care time)  Medications Ordered in UC Medications  ketorolac (TORADOL) 30 MG/ML injection 30 mg (30 mg Intramuscular Given 04/16/23 1957)  dexamethasone (DECADRON) injection 10 mg (10 mg Intramuscular Given 04/16/23 1958)    Initial Impression / Assessment and Plan / UC Course  I have reviewed the triage vital signs and the nursing notes.  Pertinent labs & imaging results that were available during my care of the patient were reviewed by me and considered in my medical decision making (see chart for details).     Cervical radiculitis with chronic back pain exacerbation. Treatment with Decadron 10 IM and Toradol 30 mg IM given here in clinic. Information given to Final Clinical Impressions(s) / UC Diagnoses   Final diagnoses:  Cervical radiculitis  Chronic bilateral low  back pain with bilateral sciatica   Discharge Instructions   None    ED Prescriptions     Medication Sig Dispense Auth. Provider   predniSONE (DELTASONE) 20 MG tablet Take 1 tablet (20 mg total) by mouth 2 (two) times daily with a meal for 5 days. 10 tablet Buena Carmine, NP   tiZANidine (ZANAFLEX) 4 MG tablet Take 1 tablet (4 mg total) by mouth at bedtime as needed for muscle spasms. 20 tablet Buena Carmine, NP      PDMP not reviewed this encounter.

## 2023-04-30 ENCOUNTER — Ambulatory Visit (HOSPITAL_COMMUNITY)

## 2023-04-30 ENCOUNTER — Ambulatory Visit (INDEPENDENT_AMBULATORY_CARE_PROVIDER_SITE_OTHER)

## 2023-04-30 ENCOUNTER — Encounter (HOSPITAL_COMMUNITY): Payer: Self-pay

## 2023-04-30 ENCOUNTER — Ambulatory Visit (HOSPITAL_COMMUNITY)
Admission: EM | Admit: 2023-04-30 | Discharge: 2023-04-30 | Disposition: A | Discharge status: Home / Self Care | Attending: Family Medicine | Admitting: Family Medicine

## 2023-04-30 DIAGNOSIS — R0789 Other chest pain: Secondary | ICD-10-CM

## 2023-04-30 DIAGNOSIS — M549 Dorsalgia, unspecified: Secondary | ICD-10-CM

## 2023-04-30 DIAGNOSIS — I309 Acute pericarditis, unspecified: Secondary | ICD-10-CM | POA: Diagnosis not present

## 2023-04-30 DIAGNOSIS — M546 Pain in thoracic spine: Secondary | ICD-10-CM

## 2023-04-30 HISTORY — DX: Other chronic pain: G89.29

## 2023-04-30 MED ORDER — TIZANIDINE HCL 4 MG PO TABS: 4.0000 mg | ORAL_TABLET | Freq: Every evening | ORAL | 0 refills | Status: AC | PRN

## 2023-04-30 MED ORDER — IBUPROFEN 800 MG PO TABS: 800.0000 mg | ORAL_TABLET | Freq: Three times a day (TID) | ORAL | 0 refills | Status: AC

## 2023-04-30 NOTE — ED Provider Notes (Signed)
 [facility]    CSN: [id number] Arrival date & time: 04/30/23  7829      History   Chief Complaint Chief Complaint  Patient presents with   Back Pain   Shortness of Breath    HPI [name] is a 48 y.o. female.   Patient presenting for approximately 10-day history of upper thoracic back pain as well as some chest tightness and cough.  Patient notes that her chest/upper back pain seems to be related and that she feels better in her pain when she leans forward.  Patient notes that laying down flat at night causes her to cough and increases her pain.  Patient denies any pain over her sternum but does note some mild shortness of breath depending on the positioning.  Patient denies any recent travel or any sick contacts.  Patient states that she has not recently had any viral illnesses or long travel.  No history of any clots or cardiac issues.  Patient was recently seen in the urgent care on 4/14 for lower back pain and at that time had a steroid injection notes that the lower back pain did improve however the upper back pain started a few days afterwards.   Back Pain Shortness of Breath   Past Medical History:  Diagnosis Date   Arthritis    neck   Chronic back pain    Eczema    H/O nephrolithotomy with removal of calculi    RIGHT FLANK OPEN INCISION AREA--  CHANGE DRESSING DAILY   Herniated lumbar intervertebral disc    History of kidney stones    Right nephrolithiasis    Right wrist tendonitis    Yeast infection    current- 03/25/13- has notified Dr Gordy Lauber. 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   Staghorn renal calculus 03/03/2013   S/P repeat low transverse C-section 10/18/2012   H/O unilateral salpingectomy 10/18/2012   Lower urinary tract infectious disease 04/01/2012   Renal stone 04/01/2012   Hydronephrosis 04/01/2012   Pregnancy 04/01/2012    Past Surgical History:  Procedure Laterality Date   CESAREAN SECTION     4 PREVIOUS   CESAREAN SECTION WITH BILATERAL TUBAL LIGATION Right 10/18/2012   Procedure: REPEAT CESAREAN SECTION WITH BILATERAL TUBAL LIGATION;  Surgeon: Adrianna Albee, MD;  Location: [facility];  Service: Obstetrics;  Laterality: Right; TOTAL OF 4 C SECTIONS   CYSTOSCOPY WITH RETROGRADE PYELOGRAM, URETEROSCOPY AND STENT PLACEMENT Right 04/09/2013   Procedure: CYSTOSCOPY WITH STAGED URETEROSCOPY AND STENT EXCHANGE;  Surgeon: Soledad Dupes, MD;  Location: [facility];  Service: Urology;  Laterality: Right;   CYSTOSCOPY/RETROGRADE/URETEROSCOPY Right 03/27/2013   Procedure: CYSTOSCOPY RIGHT URETEROSCOPY LASER LITHOTRIPSY  RIGHT RETROGRADE PYELOGRAM STENT PLACEMENT ON RIGHT;  Surgeon: Soledad Dupes, MD;  Location: [facility];  Service: Urology;  Laterality: Right;   ECTOPIC PREGNANCY SURGERY  2005   HOLMIUM LASER APPLICATION Right 03/03/2013   Procedure: HOLMIUM LASER APPLICATION;  Surgeon: Soledad Dupes, MD;  Location: [facility];  Service: Urology;  Laterality: Right;   HOLMIUM LASER APPLICATION  Right 03/18/2013   Procedure: HOLMIUM LASER APPLICATION;  Surgeon: Soledad Dupes, MD;  Location: [facility];  Service: Urology;  Laterality: Right;   HOLMIUM LASER APPLICATION Right 03/27/2013   Procedure: HOLMIUM LASER APPLICATION;  Surgeon: Soledad Dupes, MD;  Location: [facility];  Service: Urology;  Laterality: Right;   HOLMIUM LASER APPLICATION Right 04/09/2013   Procedure: HOLMIUM LASER APPLICATION;  Surgeon: Soledad Dupes, MD;  Location: [facility];  Service:  Urology;  Laterality: Right;   NEPHROLITHOTOMY Right 03/03/2013   Procedure: Cystoscopy, Right Ureteroscopy with holmium laser With Strent;  Surgeon: Soledad Dupes, MD;  Location: [facility];  Service: Urology;  Laterality: Right;   NEPHROLITHOTOMY Right 03/18/2013   Procedure: RIGHT NEPHROLITHOTOMY PERCUTANEOUS   (1 STAGE) WITH HOLMIUM LASER;  Surgeon: Soledad Dupes, MD;  Location: [facility];  Service: Urology;  Laterality: Right;   TUBAL LIGATION      OB History     Gravida  5   Para  4   Term  4   Preterm  0   AB  1   Living  4      SAB  0   IAB  0   Ectopic  1   Multiple      Live Births  4            Home Medications    Prior to Admission medications   Medication Sig Start Date End Date Taking? Authorizing Provider  ibuprofen  (ADVIL ) 800 MG tablet Take 1 tablet (800 mg total) by mouth 3 (three) times daily for 14 days. 04/30/23 05/14/23 Yes Sabria Florido, MD  HYDROcodone -acetaminophen  (NORCO/VICODIN) 5-325 MG tablet Take 1 tablet by mouth every 6 (six) hours as needed (pain). Patient not taking: Reported on 11/21/2022 06/29/22   Ann Keto, MD  tiZANidine  (ZANAFLEX ) 4 MG tablet Take 1 tablet (4 mg total) by mouth at bedtime as needed for muscle spasms. 04/30/23   Jude Norton, MD    Family History Family History  Problem Relation Age of Onset   Diabetes type II Mother    Hypertension Mother    Diabetes type II Sister     Social History Social History   Tobacco Use   Smoking status: Some Days    Current packs/day: 0.00    Types: Cigarettes    Start date: 03/11/2013    Last attempt to quit: 03/11/2013    Years since quitting: 10.1   Smokeless tobacco: Never   Tobacco comments:    - plans to quit  Vaping Use   Vaping status: Never Used  Substance Use Topics   Alcohol use: Yes    Comment: wine occ    Drug use: No     Allergies   Other   Review of Systems Review of Systems  Respiratory:  Positive for shortness of breath.    Musculoskeletal:  Positive for back pain.     Physical Exam Triage Vital Signs ED Triage Vitals [04/30/23 0930]  Encounter Vitals Group     BP (!) 154/78     Systolic BP Percentile      Diastolic BP Percentile      Pulse Rate 62     Resp 16     Temp 98.1 F (36.7 C)     Temp Source Oral     SpO2 98 %     Weight      Height      Head Circumference  Peak Flow      Pain Score 10     Pain Loc      Pain Education      Exclude from Growth Chart    No data found.  Updated Vital Signs BP (!) 163/94 (BP Location: Left Arm)   Pulse 62   Temp 98.1 F (36.7 C) (Oral)   Resp 16   LMP 04/29/2023 (Exact Date)   SpO2 98%   Visual Acuity Right Eye Distance:   Left Eye Distance:   Bilateral Distance:    Right Eye Near:   Left Eye Near:    Bilateral Near:     Physical Exam Constitutional:      Appearance: She is well-developed.  Pulmonary:     Effort: Pulmonary effort is normal. No tachypnea, accessory muscle usage or respiratory distress.     Breath sounds: Normal breath sounds.  Chest:     Chest wall: No mass or tenderness (No tenderness over the sternum or his zygomatic process).  Musculoskeletal:        General: Normal range of motion.     Right lower leg: No tenderness. No edema.     Left lower leg: No tenderness. No edema.     Comments: No tenderness over the Thoracics spinal processes, there is some tenderness as well as hypertonia noted of the thoracic paraspinal muscles approximately T4.  Neurological:     General: No focal deficit present.     Mental Status: She is alert and oriented to person, place, and time.      UC Treatments / Results  Labs (all labs ordered are listed, but only abnormal results are displayed) Labs Reviewed - No data to display  EKG   Radiology DG Chest 2 View Result Date: 04/30/2023 CLINICAL DATA:  Cough.  Pain and ribs EXAM: CHEST - 2 VIEW COMPARISON:  None Available. FINDINGS: The heart size and mediastinal contours are  within normal limits. No consolidation, pneumothorax or effusion. No edema. Curvature of the spine. Mild osteophytes. IMPRESSION: No acute cardiopulmonary disease. Electronically Signed   By: Adrianna Horde M.D.   On: 04/30/2023 10:54    Procedures Procedures (including critical care time)  Medications Ordered in UC Medications - No data to display  Initial Impression / Assessment and Plan / UC Course  I have reviewed the triage vital signs and the nursing notes.  Pertinent labs & imaging results that were available during my care of the patient were reviewed by me and considered in my medical decision making (see chart for details).     Patient presenting with nonspecific upper back pain with also noted chest tightness and some cough/shortness of breath.  Will go ahead and do chest x-ray today as well as EKG.  Patient does note improvement with forward leaning.  EKG does show some possible T wave inversions.  Thoracic x-rays appear appropriate and chest x-ray does not appear concerning.  Patient likely dealing with pericarditis at this time.  Will go ahead and treat with ibuprofen  800 mg 3 times daily for the next 14 days.  Patient was advised to follow-up in the ER if she feels like she is getting worse despite using these medications.  Would also recommend Zanaflex  for hypertonicity/thoracic spasm.  Patient also advised to follow-up with PCP. Final Clinical Impressions(s) / UC Diagnoses   Final diagnoses:  Acute pericarditis, unspecified type     Discharge Instructions      Please take your ibuprofen  800 mg 3 times a day.  Please take it with food and drink lots of fluids.  You may take the ibuprofen  for 14 days.  If you feel like you are getting worse during that 14-day span please go straight to the ER.  If you feel like you did not improve much after the 14 days then please follow-up with your primary care doctor or with us  in urgent care  Please take your Zanaflex  at night for  muscle relaxant.  I would recommend placing a heating pad over your upper back and not using any heat     ED Prescriptions     Medication Sig Dispense Auth. Provider   tiZANidine  (ZANAFLEX ) 4 MG tablet Take 1 tablet (4 mg total) by mouth at bedtime as needed for muscle spasms. 20 tablet Coleta Grosshans, MD   ibuprofen  (ADVIL ) 800 MG tablet Take 1 tablet (800 mg total) by mouth 3 (three) times daily for 14 days. 42 tablet Ovila Lepage, MD      PDMP not reviewed this encounter.   Jude Norton, MD 04/30/23 [phone]

## 2023-04-30 NOTE — ED Triage Notes (Signed)
 Patient states she has chronic low back. Patient states she was seen at another UC on 04/16/23 for back pain and pain was decreased and is back to where she can tolerate.  Patient now reports that she has had mid back pain, SOB, fatigue, cough, and states pain increases when she tries to sit up straight.   Patient states she took Aleve  yesterday.

## 2023-04-30 NOTE — Discharge Instructions (Addendum)
 Please take your ibuprofen  800 mg 3 times a day.  Please take it with food and drink lots of fluids.  You may take the ibuprofen  for 14 days.  If you feel like you are getting worse during that 14-day span please go straight to the ER.  If you feel like you did not improve much after the 14 days then please follow-up with your primary care doctor or with us  in urgent care  Please take your Zanaflex  at night for muscle relaxant.  I would recommend placing a heating pad over your upper back and not using any heat

## 2023-11-05 ENCOUNTER — Encounter: Payer: Self-pay | Admitting: Radiology

## 2024-01-20 ENCOUNTER — Emergency Department (HOSPITAL_COMMUNITY)
Admission: EM | Admit: 2024-01-20 | Discharge: 2024-01-20 | Disposition: A | Discharge status: Home / Self Care | Attending: Emergency Medicine | Admitting: Emergency Medicine

## 2024-01-20 ENCOUNTER — Emergency Department (HOSPITAL_COMMUNITY)

## 2024-01-20 ENCOUNTER — Other Ambulatory Visit: Payer: Self-pay

## 2024-01-20 DIAGNOSIS — M869 Osteomyelitis, unspecified: Secondary | ICD-10-CM | POA: Diagnosis not present

## 2024-01-20 DIAGNOSIS — M545 Low back pain, unspecified: Secondary | ICD-10-CM | POA: Diagnosis present

## 2024-01-20 DIAGNOSIS — M898X8 Other specified disorders of bone, other site: Secondary | ICD-10-CM

## 2024-01-20 MED ORDER — NAPROXEN 500 MG PO TABS: 500.0000 mg | ORAL_TABLET | Freq: Two times a day (BID) | ORAL | 0 refills | Status: AC

## 2024-01-20 MED ORDER — ACETAMINOPHEN 500 MG PO TABS
1000.0000 mg | ORAL_TABLET | Freq: Once | ORAL | Status: AC
  Administered 2024-01-20: 1000 mg via ORAL
  Filled 2024-01-20: qty 2

## 2024-01-20 MED ORDER — LIDOCAINE 4 % EX PTCH: 1.0000 | MEDICATED_PATCH | Freq: Two times a day (BID) | CUTANEOUS | 0 refills | Status: AC

## 2024-01-20 MED ORDER — LIDOCAINE 5 % EX PTCH
1.0000 | MEDICATED_PATCH | CUTANEOUS | Status: DC
  Administered 2024-01-20: 1 via TRANSDERMAL
  Filled 2024-01-20: qty 1

## 2024-01-20 MED ORDER — HYDROCODONE-ACETAMINOPHEN 5-325 MG PO TABS: 1.0000 | ORAL_TABLET | Freq: Three times a day (TID) | ORAL | 0 refills | Status: AC | PRN

## 2024-01-20 MED ORDER — METHOCARBAMOL 500 MG PO TABS: 500.0000 mg | ORAL_TABLET | Freq: Two times a day (BID) | ORAL | 0 refills | Status: AC

## 2024-01-20 NOTE — ED Provider Triage Note (Signed)
 Emergency Medicine Provider Triage Evaluation Note  [name] , a 49 y.o. female  was evaluated in triage.  Pt complains of left hip pain. Taking aleve  and naproxen  currently and epsom salt baths as well. Nothing topical helps. Previously following with [facility].  Has a known herniated lumbar disc, states that however the hip pain is new and that is really been bothering her over the last month.  States that the pain was so bad earlier today she was unable to walk well.  Denies groin numbness/tingling.  Does appreciate a few episodes of urinary incontinence at times however is unsure if this is due to her previous C-sections, denies known urinary retention or bowel incontinence or retention.  Patient is able to ambulate.  Denies infectious symptoms.  Denies IV drug use.  Review of Systems  Positive: Left-sided low back pain, left hip pain Negative: Fever, chills, weakness  Physical Exam  BP (!) 164/94 (BP Location: Left Arm)   Pulse 71   Temp 98.4 F (36.9 C) (Oral)   Resp 16   Ht 5' (1.524 m)   Wt 77.6 kg   LMP 01/01/2024 (Approximate)   SpO2 100%   BMI 33.41 kg/m  Gen:   Awake, no distress   Resp:  Normal effort  MSK:   Moves extremities without difficulty  Other:  Mild tenderness of lumbar spine with palpation  Medical Decision Making  Medically screening exam initiated at 1:52 PM.  Appropriate orders placed.  Nakaya M Bankert was informed that the remainder of the evaluation will be completed by another provider, this initial triage assessment does not replace that evaluation, and the importance of remaining in the ED until their evaluation is complete.  Orders: Lumbar x-ray, left hip x-ray   Janetta Terrall FALCON, PA-C 01/20/24 1401

## 2024-01-20 NOTE — Discharge Instructions (Signed)
 We saw you in the ER for back pain/hip pain. Fortunately, our evaluation is not concerning for emergent pathology such as spinal cord compression or infection.  There is some evidence of inflammation around the pelvic bone per radiologist.   Often the pain is self limiting, you just need time and supportive medications such as ibuprofen  every 6-8 hours for the next few days. Take the muscle relaxant as needed, and see your primary care doctor or [facility] for further management if the symptoms continue to linger.   Please return to the ER if your pain becomes excruciating or you start developing new numbness, weakness, urinary incontinence (peeing on self without warning), urinary retention (not able to pee despite bladder feeling full), inability to defecate, pins and needles sensation by your ano-genital area.

## 2024-01-20 NOTE — ED Provider Notes (Signed)
 " [place] [facility] Provider Note   CSN: [id number] Arrival date & time: 01/20/24  1322     Patient presents with: Back Pain and Hip Pain   [name] is a 49 y.o. female.   HPI    SUBJECTIVE:  [name] is a 49 y.o. female who complains of an injury causing low back pain 1 month(s) ago. The pain is positional with bending or lifting, or even turning.  Pain started a month ago, but it was nagging in nature.  Patient states that she has high pain threshold.  This morning however she could feel excruciating pain as soon as she tried to sit up from supine position and the pain has been persistent and severe ever since then.  She denies any specific trauma.  Patient indicates that she had broken her tailbone for about 4 years ago.  Patient denies any radiation of pain down the legs.  She has no numbness or tingling.  She however does feel that there is significant tightness in her gluteal region.   Prior to Admission medications  Medication Sig Start Date End Date Taking? Authorizing Provider  HYDROcodone -acetaminophen  (NORCO/VICODIN) 5-325 MG tablet Take 1 tablet by mouth every 8 (eight) hours as needed for up to 3 days for severe pain (pain score 7-10). 01/20/24 01/23/24 Yes Charlyn Sora, MD  lidocaine  4 % Place 1 patch onto the skin 2 (two) times daily. 01/20/24  Yes Charlyn Sora, MD  methocarbamol  (ROBAXIN ) 500 MG tablet Take 1 tablet (500 mg total) by mouth 2 (two) times daily. 01/20/24  Yes Charlyn Sora, MD  naproxen  (NAPROSYN ) 500 MG tablet Take 1 tablet (500 mg total) by mouth 2 (two) times daily. 01/20/24  Yes Charlyn Sora, MD  tiZANidine  (ZANAFLEX ) 4 MG tablet Take 1 tablet (4 mg total) by mouth at bedtime as needed for muscle spasms. 04/30/23   Jha, Panav, MD    Allergies: Other    Review of Systems  All other systems reviewed and are negative.   Updated Vital Signs BP (!) 164/94 (BP Location: Left Arm)   Pulse 71    Temp 98.4 F (36.9 C) (Oral)   Resp 16   Ht 5' (1.524 m)   Wt 77.6 kg   LMP 01/01/2024 (Approximate)   SpO2 100%   BMI 33.41 kg/m   Physical Exam Vitals and nursing note reviewed.  Constitutional:      Appearance: She is well-developed.  HENT:     Head: Atraumatic.  Cardiovascular:     Rate and Rhythm: Normal rate.  Pulmonary:     Effort: Pulmonary effort is normal.  Musculoskeletal:        General: Tenderness present.     Cervical back: Normal range of motion and neck supple.     Comments: Patient has tenderness over the lower lumbar spine and sacral region with palpation.  No palpable spasms.  Patient able to actively raise both lower extremity and is able to tolerate internal/external rotation of the hip.  Skin:    General: Skin is warm and dry.  Neurological:     Mental Status: She is alert and oriented to person, place, and time.     (all labs ordered are listed, but only abnormal results are displayed) Labs Reviewed - No data to display  EKG: None  Radiology: DG Hip Unilat W or Wo Pelvis 2-3 Views Left Result Date: 01/20/2024 EXAM: 2 or 3 VIEW(S) XRAY OF THE UNILATERAL HIP 01/20/2024  03:13:00 PM COMPARISON: None available. 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 No malalignment. SOFT TISSUES: Unremarkable. IMPRESSION: 1. Osteitis pubis. 2. 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 Electronically signed by: Greig Pique MD 01/20/2024 04:03 PM EST RP Workstation: [id number]   DG Lumbar Spine Complete Result Date: 01/20/2024 EXAM: 4 OR MORE VIEW(S) XRAY OF THE LUMBAR SPINE 01/20/2024 03:13:00 PM COMPARISON: None available. CLINICAL HISTORY: Left lower back pain FINDINGS: LUMBAR SPINE: BONES: Vertebral body heights are maintained. Alignment is normal. DISCS AND DEGENERATIVE CHANGES: No severe degenerative changes. SOFT TISSUES: No acute  abnormality. IMPRESSION: 1. No significant abnormality of the lumbar spine. Electronically signed by: Greig Pique MD 01/20/2024 04:02 PM EST RP Workstation: [id number]     Procedures   Medications Ordered in the ED  lidocaine  (LIDODERM ) 5 % 1 patch (1 patch Transdermal Patch Applied 01/20/24 1412)  acetaminophen  (TYLENOL ) tablet 1,000 mg (1,000 mg Oral Given 01/20/24 1412)                                    Medical Decision Making Risk OTC drugs. Prescription drug management.    49 year old patient with previous history of sacral fracture comes in with chief complaint of 1 month of worsening back pain.  The pain is primarily in the back and causing significant discomfort in the left proximal gluteal region.  Differential diagnosis considered this patient includes lumbar radiculopathy, sacroiliitis, herniated disc, spinal stenosis, osteoarthritis.  Clinically this does not appear to be septic arthritis or bursitis.   Imaging was ordered from triage.  I independent interpreted patient's x-ray, there is no clear evidence of profound arthritis of the hip.  We will advised patient to follow-up with [facility], who she has seen in the past.  She might benefit with physical therapy if not getting better.  Symptoms have been now present for 30 days already, and worsening therefore it is better for her to be more aggressively managed.  Final diagnoses:  Osteitis of left side of pelvis    ED Discharge Orders          Ordered    lidocaine  4 %  2 times daily        01/20/24 1609    naproxen  (NAPROSYN ) 500 MG tablet  2 times daily        01/20/24 1609    HYDROcodone -acetaminophen  (NORCO/VICODIN) 5-325 MG tablet  Every 8 hours PRN        01/20/24 1609    methocarbamol  (ROBAXIN ) 500 MG tablet  2 times daily        01/20/24 1609               Charlyn Sora, MD 01/20/24 1616  "

## 2024-01-20 NOTE — ED Triage Notes (Signed)
 Pt arrives via POV. Pt reports she has a known herniated disc in her lower back. She reports for the past month she has been experiencing severe left hip pain, especially when waking up. No injuries. Pt AxOx4.
